# Patient Record
Sex: Female | Born: 1945
Health system: Southern US, Community
[De-identification: ages and names within clinical notes are randomized; demographics above are authoritative.]

## PROBLEM LIST (undated history)

## (undated) DIAGNOSIS — M1990 Unspecified osteoarthritis, unspecified site: Secondary | ICD-10-CM

## (undated) DIAGNOSIS — I10 Essential (primary) hypertension: Secondary | ICD-10-CM

## (undated) HISTORY — PX: TOE SURGERY: SHX1073

## (undated) HISTORY — PX: OTHER SURGICAL HISTORY: SHX169

## (undated) HISTORY — DX: Unspecified osteoarthritis, unspecified site: M19.90

## (undated) HISTORY — DX: Essential (primary) hypertension: I10

---

## 1999-01-11 ENCOUNTER — Other Ambulatory Visit: Admission: RE | Admit: 1999-01-11 | Discharge: 1999-01-11 | Payer: Self-pay | Admitting: Obstetrics and Gynecology

## 2000-01-13 ENCOUNTER — Other Ambulatory Visit: Admission: RE | Admit: 2000-01-13 | Discharge: 2000-01-13 | Payer: Self-pay | Admitting: Obstetrics and Gynecology

## 2013-12-13 LAB — HM PAP SMEAR

## 2013-12-13 LAB — HM MAMMOGRAPHY

## 2014-05-13 ENCOUNTER — Ambulatory Visit: Payer: Self-pay | Admitting: Internal Medicine

## 2014-06-15 ENCOUNTER — Ambulatory Visit (INDEPENDENT_AMBULATORY_CARE_PROVIDER_SITE_OTHER): Payer: BLUE CROSS/BLUE SHIELD | Admitting: Internal Medicine

## 2014-06-15 ENCOUNTER — Encounter: Payer: Self-pay | Admitting: Internal Medicine

## 2014-06-15 ENCOUNTER — Other Ambulatory Visit (INDEPENDENT_AMBULATORY_CARE_PROVIDER_SITE_OTHER): Payer: BLUE CROSS/BLUE SHIELD

## 2014-06-15 VITALS — BP 126/60 | HR 73 | Temp 98.2°F | Ht 64.5 in | Wt 166.0 lb

## 2014-06-15 DIAGNOSIS — H9191 Unspecified hearing loss, right ear: Secondary | ICD-10-CM

## 2014-06-15 DIAGNOSIS — I10 Essential (primary) hypertension: Secondary | ICD-10-CM

## 2014-06-15 DIAGNOSIS — Z0000 Encounter for general adult medical examination without abnormal findings: Secondary | ICD-10-CM

## 2014-06-15 DIAGNOSIS — E669 Obesity, unspecified: Secondary | ICD-10-CM

## 2014-06-15 DIAGNOSIS — M1990 Unspecified osteoarthritis, unspecified site: Secondary | ICD-10-CM

## 2014-06-15 LAB — LIPID PANEL
CHOLESTEROL: 220 mg/dL — AB (ref 0–200)
HDL: 60.1 mg/dL (ref >39.00)
LDL Cholesterol: 137 mg/dL — ABNORMAL HIGH (ref 0–99)
NonHDL: 159.9
Total CHOL/HDL Ratio: 4
Triglycerides: 116 mg/dL (ref 0.0–149.0)
VLDL: 23.2 mg/dL (ref 0.0–40.0)

## 2014-06-15 LAB — BASIC METABOLIC PANEL
BUN: 24 mg/dL — ABNORMAL HIGH (ref 6–23)
CALCIUM: 9.8 mg/dL (ref 8.4–10.5)
CO2: 34 meq/L — AB (ref 19–32)
Chloride: 103 mEq/L (ref 96–112)
Creatinine, Ser: 1 mg/dL (ref 0.40–1.20)
GFR: 58.54 mL/min — ABNORMAL LOW (ref >60.00)
Glucose, Bld: 103 mg/dL — ABNORMAL HIGH (ref 70–99)
Potassium: 3.4 mEq/L — ABNORMAL LOW (ref 3.5–5.1)
Sodium: 140 mEq/L (ref 135–145)

## 2014-06-15 NOTE — Progress Notes (Signed)
Pre visit review using our clinic review tool, if applicable. No additional management support is needed unless otherwise documented below in the visit note. 

## 2014-06-15 NOTE — Patient Instructions (Addendum)
We will check your labs today and get the records from [place]. We will call you back with the results.   We will send you to a nutritionist to talk about diet as well as the ENT to get the hearing checked.   We will see you back in about 6-12 months to see how you are doing and if you have any problems or questions before then please call the office.   Health Maintenance Adopting a healthy lifestyle and getting preventive care can go a long way to promote health and wellness. Talk with your health care provider about what schedule of regular examinations is right for you. This is a good chance for you to check in with your provider about disease prevention and staying healthy. In between checkups, there are plenty of things you can do on your own. Experts have done a lot of research about which lifestyle changes and preventive measures are most likely to keep you healthy. Ask your health care provider for more information. WEIGHT AND DIET  Eat a healthy diet  Be sure to include plenty of vegetables, fruits, low-fat dairy products, and lean protein.  Do not eat a lot of foods high in solid fats, added sugars, or salt.  Get regular exercise. This is one of the most important things you can do for your health.  Most adults should exercise for at least 150 minutes each week. The exercise should increase your heart rate and make you sweat (moderate-intensity exercise).  Most adults should also do strengthening exercises at least twice a week. This is in addition to the moderate-intensity exercise.  Maintain a healthy weight  Body mass index (BMI) is a measurement that can be used to identify possible weight problems. It estimates body fat based on height and weight. Your health care provider can help determine your BMI and help you achieve or maintain a healthy weight.  For females 78 years of age and older:   A BMI below 18.5 is considered underweight.  A BMI of 18.5 to 24.9 is  normal.  A BMI of 25 to 29.9 is considered overweight.  A BMI of 30 and above is considered obese.  Watch levels of cholesterol and blood lipids  You should start having your blood tested for lipids and cholesterol at 69 years of age, then have this test every 5 years.  You may need to have your cholesterol levels checked more often if:  Your lipid or cholesterol levels are high.  You are older than 68 years of age.  You are at high risk for heart disease.  CANCER SCREENING   Lung Cancer  Lung cancer screening is recommended for adults 56-77 years old who are at high risk for lung cancer because of a history of smoking.  A yearly low-dose CT scan of the lungs is recommended for people who:  Currently smoke.  Have quit within the past 15 years.  Have at least a 30-pack-year history of smoking. A pack year is smoking an average of one pack of cigarettes a day for 1 year.  Yearly screening should continue until it has been 15 years since you quit.  Yearly screening should stop if you develop a health problem that would prevent you from having lung cancer treatment.  Breast Cancer  Practice breast self-awareness. This means understanding how your breasts normally appear and feel.  It also means doing regular breast self-exams. Let your health care provider know about any changes, no matter how small.  If you are in your 20s or 30s, you should have a clinical breast exam (CBE) by a health care provider every 1-3 years as part of a regular health exam.  If you are 70 or older, have a CBE every year. Also consider having a breast X-ray (mammogram) every year.  If you have a family history of breast cancer, talk to your health care provider about genetic screening.  If you are at high risk for breast cancer, talk to your health care provider about having an MRI and a mammogram every year.  Breast cancer gene (BRCA) assessment is recommended for women who have family members  with BRCA-related cancers. BRCA-related cancers include:  Breast.  Ovarian.  Tubal.  Peritoneal cancers.  Results of the assessment will determine the need for genetic counseling and BRCA1 and BRCA2 testing. Cervical Cancer Routine pelvic examinations to screen for cervical cancer are no longer recommended for nonpregnant women who are considered low risk for cancer of the pelvic organs (ovaries, uterus, and vagina) and who do not have symptoms. A pelvic examination may be necessary if you have symptoms including those associated with pelvic infections. Ask your health care provider if a screening pelvic exam is right for you.   The Pap test is the screening test for cervical cancer for women who are considered at risk.  If you had a hysterectomy for a problem that was not cancer or a condition that could lead to cancer, then you no longer need Pap tests.  If you are older than 65 years, and you have had normal Pap tests for the past 10 years, you no longer need to have Pap tests.  If you have had past treatment for cervical cancer or a condition that could lead to cancer, you need Pap tests and screening for cancer for at least 20 years after your treatment.  If you no longer get a Pap test, assess your risk factors if they change (such as having a new sexual partner). This can affect whether you should start being screened again.  Some women have medical problems that increase their chance of getting cervical cancer. If this is the case for you, your health care provider may recommend more frequent screening and Pap tests.  The human papillomavirus (HPV) test is another test that may be used for cervical cancer screening. The HPV test looks for the virus that can cause cell changes in the cervix. The cells collected during the Pap test can be tested for HPV.  The HPV test can be used to screen women 34 years of age and older. Getting tested for HPV can extend the interval between normal  Pap tests from three to five years.  An HPV test also should be used to screen women of any age who have unclear Pap test results.  After 69 years of age, women should have HPV testing as often as Pap tests.  Colorectal Cancer  This type of cancer can be detected and often prevented.  Routine colorectal cancer screening usually begins at 69 years of age and continues through 69 years of age.  Your health care provider may recommend screening at an earlier age if you have risk factors for colon cancer.  Your health care provider may also recommend using home test kits to check for hidden blood in the stool.  A small camera at the end of a tube can be used to examine your colon directly (sigmoidoscopy or colonoscopy). This is done to check for  the earliest forms of colorectal cancer.  Routine screening usually begins at age 9.  Direct examination of the colon should be repeated every 5-10 years through 69 years of age. However, you may need to be screened more often if early forms of precancerous polyps or small growths are found. Skin Cancer  Check your skin from head to toe regularly.  Tell your health care provider about any new moles or changes in moles, especially if there is a change in a mole's shape or color.  Also tell your health care provider if you have a mole that is larger than the size of a pencil eraser.  Always use sunscreen. Apply sunscreen liberally and repeatedly throughout the day.  Protect yourself by wearing long sleeves, pants, a wide-brimmed hat, and sunglasses whenever you are outside. HEART DISEASE, DIABETES, AND HIGH BLOOD PRESSURE   Have your blood pressure checked at least every 1-2 years. High blood pressure causes heart disease and increases the risk of stroke.  If you are between 19 years and 30 years old, ask your health care provider if you should take aspirin to prevent strokes.  Have regular diabetes screenings. This involves taking a blood  sample to check your fasting blood sugar level.  If you are at a normal weight and have a low risk for diabetes, have this test once every three years after 69 years of age.  If you are overweight and have a high risk for diabetes, consider being tested at a younger age or more often. PREVENTING INFECTION  Hepatitis B  If you have a higher risk for hepatitis B, you should be screened for this virus. You are considered at high risk for hepatitis B if:  You were born in a country where hepatitis B is common. Ask your health care provider which countries are considered high risk.  Your parents were born in a high-risk country, and you have not been immunized against hepatitis B (hepatitis B vaccine).  You have HIV or AIDS.  You use needles to inject street drugs.  You live with someone who has hepatitis B.  You have had sex with someone who has hepatitis B.  You get hemodialysis treatment.  You take certain medicines for conditions, including cancer, organ transplantation, and autoimmune conditions. Hepatitis C  Blood testing is recommended for:  Everyone born from [birth year] through 1965.  Anyone with known risk factors for hepatitis C. Sexually transmitted infections (STIs)  You should be screened for sexually transmitted infections (STIs) including gonorrhea and chlamydia if:  You are sexually active and are younger than 69 years of age.  You are older than 69 years of age and your health care provider tells you that you are at risk for this type of infection.  Your sexual activity has changed since you were last screened and you are at an increased risk for chlamydia or gonorrhea. Ask your health care provider if you are at risk.  If you do not have HIV, but are at risk, it may be recommended that you take a prescription medicine daily to prevent HIV infection. This is called pre-exposure prophylaxis (PrEP). You are considered at risk if:  You are sexually active and do not  regularly use condoms or know the HIV status of your partner(s).  You take drugs by injection.  You are sexually active with a partner who has HIV. Talk with your health care provider about whether you are at high risk of being infected with HIV. If you  choose to begin PrEP, you should first be tested for HIV. You should then be tested every 3 months for as long as you are taking PrEP.  PREGNANCY   If you are premenopausal and you may become pregnant, ask your health care provider about preconception counseling.  If you may become pregnant, take 400 to 800 micrograms (mcg) of folic acid every day.  If you want to prevent pregnancy, talk to your health care provider about birth control (contraception). OSTEOPOROSIS AND MENOPAUSE   Osteoporosis is a disease in which the bones lose minerals and strength with aging. This can result in serious bone fractures. Your risk for osteoporosis can be identified using a bone density scan.  If you are 101 years of age or older, or if you are at risk for osteoporosis and fractures, ask your health care provider if you should be screened.  Ask your health care provider whether you should take a calcium or vitamin D supplement to lower your risk for osteoporosis.  Menopause may have certain physical symptoms and risks.  Hormone replacement therapy may reduce some of these symptoms and risks. Talk to your health care provider about whether hormone replacement therapy is right for you.  HOME CARE INSTRUCTIONS   Schedule regular health, dental, and eye exams.  Stay current with your immunizations.   Do not use any tobacco products including cigarettes, chewing tobacco, or electronic cigarettes.  If you are pregnant, do not drink alcohol.  If you are breastfeeding, limit how much and how often you drink alcohol.  Limit alcohol intake to no more than 1 drink per day for nonpregnant women. One drink equals 12 ounces of beer, 5 ounces of wine, or 1  ounces of hard liquor.  Do not use street drugs.  Do not share needles.  Ask your health care provider for help if you need support or information about quitting drugs.  Tell your health care provider if you often feel depressed.  Tell your health care provider if you have ever been abused or do not feel safe at home. Document Released: 10/17/2010 Document Revised: 08/18/2013 Document Reviewed: 03/05/2013 [facility] Patient Information 2015 [place], [place]. This information is not intended to replace advice given to you by your health care provider. Make sure you discuss any questions you have with your health care provider.

## 2014-06-18 ENCOUNTER — Encounter: Payer: Self-pay | Admitting: Internal Medicine

## 2014-06-18 ENCOUNTER — Telehealth: Payer: Self-pay | Admitting: Internal Medicine

## 2014-06-18 DIAGNOSIS — Z0000 Encounter for general adult medical examination without abnormal findings: Secondary | ICD-10-CM | POA: Insufficient documentation

## 2014-06-18 DIAGNOSIS — I10 Essential (primary) hypertension: Secondary | ICD-10-CM | POA: Insufficient documentation

## 2014-06-18 DIAGNOSIS — M1990 Unspecified osteoarthritis, unspecified site: Secondary | ICD-10-CM | POA: Insufficient documentation

## 2014-06-18 DIAGNOSIS — H9191 Unspecified hearing loss, right ear: Secondary | ICD-10-CM | POA: Insufficient documentation

## 2014-06-18 NOTE — Progress Notes (Signed)
   Subjective:    Patient ID: Dana Small, female    DOB: 10-21-1945, 69 y.o.   MRN: [id number]  HPI The patient is a 69 YO female who is coming in today for wellness. She denies any new illness or medications from last visit.   Diet: heart healthy Physical activity: sedentary Depression/mood screen: negative Hearing: decreased on right, hearing test ordered Visual acuity: grossly normal, performs annual eye exam  ADLs: capable Fall risk: none Home safety: good Cognitive evaluation: intact to orientation, naming, recall and repetition EOL planning: adv directives, full code/ I agree  I have personally reviewed and have noted 1. The patient's medical and social history - no change 2. Their use of alcohol, tobacco or illicit drugs - no change 3. Their current medications and supplements 4. The patient's functional ability including ADL's, fall risks, home safety risks and hearing or visual impairment. 5. Diet and physical activities 6. Evidence for depression or mood disorders 7. Care team reviewed and updated (available under snapshot)  Review of Systems  Constitutional: Negative for fever, activity change, appetite change, fatigue and unexpected weight change.  HENT: Positive for hearing loss. Negative for congestion, ear discharge and ear pain.   Eyes: Negative.   Respiratory: Negative for cough, chest tightness, shortness of breath and wheezing.   Cardiovascular: Negative for chest pain, palpitations and leg swelling.  Gastrointestinal: Negative for nausea, abdominal pain, diarrhea, constipation, blood in stool and abdominal distention.  Musculoskeletal: Positive for arthralgias. Negative for myalgias, back pain and gait problem.  Skin: Negative.   Neurological: Negative.   Psychiatric/Behavioral: Negative.       Objective:   Physical Exam  Constitutional: She is oriented to person, place, and time. She appears well-developed and well-nourished.  HENT:  Head:  Normocephalic and atraumatic.  Eyes: EOM are normal.  Neck: Normal range of motion.  Cardiovascular: Normal rate and regular rhythm.   Pulmonary/Chest: Effort normal and breath sounds normal. No respiratory distress. She has no wheezes.  Abdominal: Soft. Bowel sounds are normal. She exhibits no distension. There is no tenderness.  Musculoskeletal: She exhibits no edema.  Neurological: She is alert and oriented to person, place, and time. Coordination normal.  Skin: Skin is warm and dry.  Psychiatric: She has a normal mood and affect.   Filed Vitals:   06/15/14 1307  BP: 126/60  Pulse: 73  Temp: 98.2 F (36.8 C)  TempSrc: Oral  Height: 5' 4.5" (1.638 m)  Weight: 166 lb (75.297 kg)  SpO2: 98%      Assessment & Plan:

## 2014-06-18 NOTE — Assessment & Plan Note (Signed)
Check BMP, BP controlled on maxzide at today's visit.

## 2014-06-18 NOTE — Assessment & Plan Note (Signed)
Ordered hearing test for screening, not missing much in conversation but having difficulty in larger group settings.

## 2014-06-18 NOTE — Assessment & Plan Note (Signed)
She thinks that she is up to date on shots. Will obtain records from previous provider to verify. No shots given today. She knows she has colonoscopy but may be due for repeat. Also thinks she has had dexa, not sure what year or results. List of screening recommendations given to the patient. Hearing test ordered per deficit noted on exam.

## 2014-06-18 NOTE — Telephone Encounter (Signed)
Rec'd from [facility] forward 51 pages to Dr. Doug Sou

## 2014-06-22 ENCOUNTER — Other Ambulatory Visit: Payer: Self-pay | Admitting: Geriatric Medicine

## 2014-06-22 ENCOUNTER — Telehealth: Payer: Self-pay | Admitting: Internal Medicine

## 2014-06-22 MED ORDER — TRIAMTERENE-HCTZ 37.5-25 MG PO CAPS: 1.0000 | ORAL_CAPSULE | Freq: Every day | ORAL | Status: DC

## 2014-06-22 NOTE — Telephone Encounter (Signed)
Pt called in requesting refill on her triamterene-hydrochlorothiazide (DYAZIDE) 37.5-25 MG Wants year script    [facility] on [address]

## 2014-06-22 NOTE — Telephone Encounter (Signed)
Rec'd from Dr. Langston Reusing MD forward 68 pages to [facility]

## 2014-06-23 ENCOUNTER — Other Ambulatory Visit: Payer: Self-pay | Admitting: Geriatric Medicine

## 2014-06-23 MED ORDER — TRIAMTERENE-HCTZ 37.5-25 MG PO CAPS: 1.0000 | ORAL_CAPSULE | Freq: Every day | ORAL | Status: DC

## 2014-06-23 MED ORDER — TRIAMTERENE-HCTZ 37.5-25 MG PO TABS: 1.0000 | ORAL_TABLET | Freq: Every day | ORAL | Status: DC

## 2014-06-23 NOTE — Telephone Encounter (Signed)
Pt called said that this need to be send to [facility] on [address].

## 2014-06-23 NOTE — Telephone Encounter (Signed)
Sent to pharmacy 

## 2014-07-03 ENCOUNTER — Encounter: Payer: BLUE CROSS/BLUE SHIELD | Attending: Internal Medicine | Admitting: Skilled Nursing Facility1

## 2014-07-03 ENCOUNTER — Encounter: Payer: Self-pay | Admitting: Skilled Nursing Facility1

## 2014-07-03 VITALS — Ht 64.0 in | Wt 164.0 lb

## 2014-07-03 DIAGNOSIS — Z713 Dietary counseling and surveillance: Secondary | ICD-10-CM | POA: Diagnosis not present

## 2014-07-03 DIAGNOSIS — E663 Overweight: Secondary | ICD-10-CM | POA: Insufficient documentation

## 2014-07-03 DIAGNOSIS — Z6828 Body mass index (BMI) 28.0-28.9, adult: Secondary | ICD-10-CM | POA: Diagnosis not present

## 2014-07-03 NOTE — Patient Instructions (Signed)
-  Gardening on the days you do not go to the gym -Try to find a stress relief mechanism for your nights; perhaps a stationary bike or kick boxing -Ensure your supplements have the USP label

## 2014-07-03 NOTE — Progress Notes (Signed)
  Medical Nutrition Therapy:  Appt start time: 1130 end time:  1230.   Assessment:  Primary concerns today: Referred for overweight. Pt states she Would like to eat healthier and maybe lose a couple of pounds (15 pounds). Pt states she Has tried Weight watchers and lost a couple of pounds but did not want to stick with it. Pt states she is Stressed at night and eating to feel better. She is very afraid of her house being broken into. She is currently living alone but her husband will be back home in April.  Pt Retired at 69 years old and was 152 pounds at that time.   Pt is currently taking a multivitamin and a vitamin C. Pt states she has a deviated septum and says she feels she is not getting enough oxygen from that.  Preferred Learning Style:   No preference indicated   Learning Readiness:   Ready  MEDICATIONS: See List   DIETARY INTAKE:  Usual eating pattern includes 3 meals and 2-3 snacks per day.  Everyday foods include none stated. Avoided foods include carbohydrates and there foods "they" say are "bad".    24-hr recall:  B ( AM): oatmeal, weight watchers bar  Snk ( AM): nuts  L ( PM): eats out Snk ( PM): pumpkin seeds D ( PM): whole wheat and artichoke pasta with cheese powder Snk ( PM): cheese its, chips, jelly beans Beverages: wine, coffee  Usual physical activity: 2 days a week water aerobics (1 hour) and walks 2 times a week 1.5 miles  Estimated energy needs: 1600 calories 180 g carbohydrates 120 g protein 44 g fat  Progress Towards Goal(s):  In progress.   Nutritional Diagnosis:  NB-1.1 Food and nutrition-related knowledge deficit As related to no previous nutrition education.  As evidenced by BMI 28, avoiding carbohydrates and other foods becasue they were perceived as "bad".    Intervention:  Nutrition Counseling for overweight. Dietitian educated the patient on the importance of 3 meals with 2-3 snacks daily, daily physical activity, and the importance of  all food groups including carbohydrates.  Goals:   -Gardening on the days you do not go to the gym -Try to find a stress relief mechanism for your nights; perhaps a stationary bike or kick boxing -Ensure your supplements have the USP label  Teaching Method Utilized:  Visual Auditory  Handouts given during visit include:  MyPlate  15 gram carbohydrate snack sheet  Barriers to learning/adherence to lifestyle change: her fear at night  Demonstrated degree of understanding via:  Teach Back   Monitoring/Evaluation:  Dietary intake, exercise, and body weight prn.

## 2014-07-06 ENCOUNTER — Other Ambulatory Visit: Payer: Self-pay | Admitting: Geriatric Medicine

## 2014-07-06 MED ORDER — TRIAMTERENE-HCTZ 37.5-25 MG PO TABS: 1.0000 | ORAL_TABLET | Freq: Every day | ORAL | Status: DC

## 2014-07-13 ENCOUNTER — Other Ambulatory Visit: Payer: Self-pay | Admitting: Geriatric Medicine

## 2014-07-13 MED ORDER — TRIAMTERENE-HCTZ 37.5-25 MG PO TABS: 1.0000 | ORAL_TABLET | Freq: Every day | ORAL | Status: DC

## 2015-07-01 DIAGNOSIS — L720 Epidermal cyst: Secondary | ICD-10-CM | POA: Diagnosis not present

## 2015-07-01 DIAGNOSIS — L219 Seborrheic dermatitis, unspecified: Secondary | ICD-10-CM | POA: Diagnosis not present

## 2015-07-01 DIAGNOSIS — L570 Actinic keratosis: Secondary | ICD-10-CM | POA: Diagnosis not present

## 2015-07-01 DIAGNOSIS — D224 Melanocytic nevi of scalp and neck: Secondary | ICD-10-CM | POA: Diagnosis not present

## 2015-07-01 DIAGNOSIS — D485 Neoplasm of uncertain behavior of skin: Secondary | ICD-10-CM | POA: Diagnosis not present

## 2015-07-01 DIAGNOSIS — L821 Other seborrheic keratosis: Secondary | ICD-10-CM | POA: Diagnosis not present

## 2015-07-01 DIAGNOSIS — D2222 Melanocytic nevi of left ear and external auricular canal: Secondary | ICD-10-CM | POA: Diagnosis not present

## 2015-08-16 DIAGNOSIS — H16132 Photokeratitis, left eye: Secondary | ICD-10-CM | POA: Diagnosis not present

## 2015-08-16 DIAGNOSIS — D485 Neoplasm of uncertain behavior of skin: Secondary | ICD-10-CM | POA: Diagnosis not present

## 2015-08-17 DIAGNOSIS — L820 Inflamed seborrheic keratosis: Secondary | ICD-10-CM | POA: Diagnosis not present

## 2015-08-27 ENCOUNTER — Other Ambulatory Visit: Payer: Self-pay

## 2015-08-27 MED ORDER — TRIAMTERENE-HCTZ 37.5-25 MG PO TABS: 1.0000 | ORAL_TABLET | Freq: Every day | ORAL | Status: DC

## 2015-10-11 ENCOUNTER — Other Ambulatory Visit (INDEPENDENT_AMBULATORY_CARE_PROVIDER_SITE_OTHER): Payer: Medicare Other

## 2015-10-11 ENCOUNTER — Ambulatory Visit (INDEPENDENT_AMBULATORY_CARE_PROVIDER_SITE_OTHER): Payer: Medicare Other | Admitting: Internal Medicine

## 2015-10-11 ENCOUNTER — Encounter: Payer: Self-pay | Admitting: Internal Medicine

## 2015-10-11 VITALS — BP 160/82 | HR 67 | Temp 98.6°F | Resp 14 | Ht 64.5 in | Wt 170.0 lb

## 2015-10-11 DIAGNOSIS — E789 Disorder of lipoprotein metabolism, unspecified: Secondary | ICD-10-CM

## 2015-10-11 DIAGNOSIS — I10 Essential (primary) hypertension: Secondary | ICD-10-CM | POA: Diagnosis not present

## 2015-10-11 DIAGNOSIS — Z1231 Encounter for screening mammogram for malignant neoplasm of breast: Secondary | ICD-10-CM

## 2015-10-11 DIAGNOSIS — Z23 Encounter for immunization: Secondary | ICD-10-CM | POA: Diagnosis not present

## 2015-10-11 DIAGNOSIS — Z0000 Encounter for general adult medical examination without abnormal findings: Secondary | ICD-10-CM

## 2015-10-11 DIAGNOSIS — R5383 Other fatigue: Secondary | ICD-10-CM

## 2015-10-11 LAB — COMPREHENSIVE METABOLIC PANEL
ALK PHOS: 65 U/L (ref 39–117)
ALT: 21 U/L (ref 0–35)
AST: 20 U/L (ref 0–37)
Albumin: 4.4 g/dL (ref 3.5–5.2)
BUN: 22 mg/dL (ref 6–23)
CHLORIDE: 101 meq/L (ref 96–112)
CO2: 34 meq/L — AB (ref 19–32)
Calcium: 10.1 mg/dL (ref 8.4–10.5)
Creatinine, Ser: 0.68 mg/dL (ref 0.40–1.20)
GFR: 91 mL/min (ref >60.00)
GLUCOSE: 105 mg/dL — AB (ref 70–99)
POTASSIUM: 4.4 meq/L (ref 3.5–5.1)
Sodium: 140 mEq/L (ref 135–145)
Total Bilirubin: 0.5 mg/dL (ref 0.2–1.2)
Total Protein: 7.3 g/dL (ref 6.0–8.3)

## 2015-10-11 LAB — LIPID PANEL
CHOL/HDL RATIO: 4
Cholesterol: 214 mg/dL — ABNORMAL HIGH (ref 0–200)
HDL: 55.2 mg/dL (ref >39.00)
LDL Cholesterol: 133 mg/dL — ABNORMAL HIGH (ref 0–99)
NonHDL: 158.3
TRIGLYCERIDES: 125 mg/dL (ref 0.0–149.0)
VLDL: 25 mg/dL (ref 0.0–40.0)

## 2015-10-11 LAB — TSH: TSH: 2.34 u[IU]/mL (ref 0.35–4.50)

## 2015-10-11 NOTE — Progress Notes (Signed)
   Subjective:    Patient ID: Dana Small, female    DOB: [birth year], 70 y.o.   MRN: [id number]  HPI Here for medicare wellness, no new complaints. Please see A/P for status and treatment of chronic medical problems.   Diet: heart healthy Physical activity: active Depression/mood screen: negative Hearing: intact to whispered voice Visual acuity: grossly normal, performs annual eye exam  ADLs: capable Fall risk: none Home safety: good Cognitive evaluation: intact to orientation, naming, recall and repetition EOL planning: adv directives discussed  I have personally reviewed and have noted 1. The patient's medical and social history - reviewed today no changes 2. Their use of alcohol, tobacco or illicit drugs 3. Their current medications and supplements 4. The patient's functional ability including ADL's, fall risks, home safety risks and hearing or visual impairment. 5. Diet and physical activities 6. Evidence for depression or mood disorders 7. Care team reviewed and updated (available in snapshot)  Review of Systems  Constitutional: Negative for fever, activity change, appetite change, fatigue and unexpected weight change.  HENT: Positive for hearing loss. Negative for congestion, ear discharge and ear pain.   Eyes: Negative.   Respiratory: Negative for cough, chest tightness, shortness of breath and wheezing.   Cardiovascular: Negative for chest pain, palpitations and leg swelling.  Gastrointestinal: Negative for nausea, abdominal pain, diarrhea, constipation, blood in stool and abdominal distention.  Musculoskeletal: Positive for arthralgias. Negative for myalgias, back pain and gait problem.  Skin: Negative.   Neurological: Negative.   Psychiatric/Behavioral: Negative.       Objective:   Physical Exam  Constitutional: She is oriented to person, place, and time. She appears well-developed and well-nourished.  HENT:  Head: Normocephalic and atraumatic.  Eyes: EOM are  normal.  Neck: Normal range of motion.  Cardiovascular: Normal rate and regular rhythm.   Pulmonary/Chest: Effort normal and breath sounds normal. No respiratory distress. She has no wheezes.  Abdominal: Soft. Bowel sounds are normal. She exhibits no distension. There is no tenderness.  Musculoskeletal: She exhibits no edema.  Neurological: She is alert and oriented to person, place, and time. Coordination normal.  Skin: Skin is warm and dry.  Psychiatric: She has a normal mood and affect.   Filed Vitals:   10/11/15 1514  BP: 160/82  Pulse: 67  Temp: 98.6 F (37 C)  TempSrc: Oral  Resp: 14  Height: 5' 4.5" (1.638 m)  Weight: 170 lb (77.111 kg)  SpO2: 98%      Assessment & Plan:  Pneumonia 23 given at visit.

## 2015-10-11 NOTE — Progress Notes (Signed)
Pre visit review using our clinic review tool, if applicable. No additional management support is needed unless otherwise documented below in the visit note. 

## 2015-10-11 NOTE — Patient Instructions (Addendum)
We have ordered the mammogram to get that checked.   We are checking the blood work today and have given you the pneumonia shot today. You are due another next year and then you will be done with those.   Work on losing about 5-10 pounds to help with the blood pressure.   Health Maintenance, Female Adopting a healthy lifestyle and getting preventive care can go a long way to promote health and wellness. Talk with your health care provider about what schedule of regular examinations is right for you. This is a good chance for you to check in with your provider about disease prevention and staying healthy. In between checkups, there are plenty of things you can do on your own. Experts have done a lot of research about which lifestyle changes and preventive measures are most likely to keep you healthy. Ask your health care provider for more information. WEIGHT AND DIET  Eat a healthy diet  Be sure to include plenty of vegetables, fruits, low-fat dairy products, and lean protein.  Do not eat a lot of foods high in solid fats, added sugars, or salt.  Get regular exercise. This is one of the most important things you can do for your health.  Most adults should exercise for at least 150 minutes each week. The exercise should increase your heart rate and make you sweat (moderate-intensity exercise).  Most adults should also do strengthening exercises at least twice a week. This is in addition to the moderate-intensity exercise.  Maintain a healthy weight  Body mass index (BMI) is a measurement that can be used to identify possible weight problems. It estimates body fat based on height and weight. Your health care provider can help determine your BMI and help you achieve or maintain a healthy weight.  For females 18 years of age and older:   A BMI below 18.5 is considered underweight.  A BMI of 18.5 to 24.9 is normal.  A BMI of 25 to 29.9 is considered overweight.  A BMI of 30 and above is  considered obese.  Watch levels of cholesterol and blood lipids  You should start having your blood tested for lipids and cholesterol at 70 years of age, then have this test every 5 years.  You may need to have your cholesterol levels checked more often if:  Your lipid or cholesterol levels are high.  You are older than 70 years of age.  You are at high risk for heart disease.  CANCER SCREENING   Lung Cancer  Lung cancer screening is recommended for adults 23-49 years old who are at high risk for lung cancer because of a history of smoking.  A yearly low-dose CT scan of the lungs is recommended for people who:  Currently smoke.  Have quit within the past 15 years.  Have at least a 30-pack-year history of smoking. A pack year is smoking an average of one pack of cigarettes a day for 1 year.  Yearly screening should continue until it has been 15 years since you quit.  Yearly screening should stop if you develop a health problem that would prevent you from having lung cancer treatment.  Breast Cancer  Practice breast self-awareness. This means understanding how your breasts normally appear and feel.  It also means doing regular breast self-exams. Let your health care provider know about any changes, no matter how small.  If you are in your 20s or 30s, you should have a clinical breast exam (CBE) by a  health care provider every 1-3 years as part of a regular health exam.  If you are 40 or older, have a CBE every year. Also consider having a breast X-ray (mammogram) every year.  If you have a family history of breast cancer, talk to your health care provider about genetic screening.  If you are at high risk for breast cancer, talk to your health care provider about having an MRI and a mammogram every year.  Breast cancer gene (BRCA) assessment is recommended for women who have family members with BRCA-related cancers. BRCA-related cancers  include:  Breast.  Ovarian.  Tubal.  Peritoneal cancers.  Results of the assessment will determine the need for genetic counseling and BRCA1 and BRCA2 testing. Cervical Cancer Your health care provider may recommend that you be screened regularly for cancer of the pelvic organs (ovaries, uterus, and vagina). This screening involves a pelvic examination, including checking for microscopic changes to the surface of your cervix (Pap test). You may be encouraged to have this screening done every 3 years, beginning at age 21.  For women ages 30-65, health care providers may recommend pelvic exams and Pap testing every 3 years, or they may recommend the Pap and pelvic exam, combined with testing for human papilloma virus (HPV), every 5 years. Some types of HPV increase your risk of cervical cancer. Testing for HPV may also be done on women of any age with unclear Pap test results.  Other health care providers may not recommend any screening for nonpregnant women who are considered low risk for pelvic cancer and who do not have symptoms. Ask your health care provider if a screening pelvic exam is right for you.  If you have had past treatment for cervical cancer or a condition that could lead to cancer, you need Pap tests and screening for cancer for at least 20 years after your treatment. If Pap tests have been discontinued, your risk factors (such as having a new sexual partner) need to be reassessed to determine if screening should resume. Some women have medical problems that increase the chance of getting cervical cancer. In these cases, your health care provider may recommend more frequent screening and Pap tests. Colorectal Cancer  This type of cancer can be detected and often prevented.  Routine colorectal cancer screening usually begins at 70 years of age and continues through 70 years of age.  Your health care provider may recommend screening at an earlier age if you have risk factors for  colon cancer.  Your health care provider may also recommend using home test kits to check for hidden blood in the stool.  A small camera at the end of a tube can be used to examine your colon directly (sigmoidoscopy or colonoscopy). This is done to check for the earliest forms of colorectal cancer.  Routine screening usually begins at age 50.  Direct examination of the colon should be repeated every 5-10 years through 70 years of age. However, you may need to be screened more often if early forms of precancerous polyps or small growths are found. Skin Cancer  Check your skin from head to toe regularly.  Tell your health care provider about any new moles or changes in moles, especially if there is a change in a mole's shape or color.  Also tell your health care provider if you have a mole that is larger than the size of a pencil eraser.  Always use sunscreen. Apply sunscreen liberally and repeatedly throughout the day.    Protect yourself by wearing long sleeves, pants, a wide-brimmed hat, and sunglasses whenever you are outside. HEART DISEASE, DIABETES, AND HIGH BLOOD PRESSURE   High blood pressure causes heart disease and increases the risk of stroke. High blood pressure is more likely to develop in:  People who have blood pressure in the high end of the normal range (130-139/85-89 mm Hg).  People who are overweight or obese.  People who are African American.  If you are 18-39 years of age, have your blood pressure checked every 3-5 years. If you are 40 years of age or older, have your blood pressure checked every year. You should have your blood pressure measured twice--once when you are at a hospital or clinic, and once when you are not at a hospital or clinic. Record the average of the two measurements. To check your blood pressure when you are not at a hospital or clinic, you can use:  An automated blood pressure machine at a pharmacy.  A home blood pressure monitor.  If you  are between 55 years and 79 years old, ask your health care provider if you should take aspirin to prevent strokes.  Have regular diabetes screenings. This involves taking a blood sample to check your fasting blood sugar level.  If you are at a normal weight and have a low risk for diabetes, have this test once every three years after 70 years of age.  If you are overweight and have a high risk for diabetes, consider being tested at a younger age or more often. PREVENTING INFECTION  Hepatitis B  If you have a higher risk for hepatitis B, you should be screened for this virus. You are considered at high risk for hepatitis B if:  You were born in a country where hepatitis B is common. Ask your health care provider which countries are considered high risk.  Your parents were born in a high-risk country, and you have not been immunized against hepatitis B (hepatitis B vaccine).  You have HIV or AIDS.  You use needles to inject street drugs.  You live with someone who has hepatitis B.  You have had sex with someone who has hepatitis B.  You get hemodialysis treatment.  You take certain medicines for conditions, including cancer, organ transplantation, and autoimmune conditions. Hepatitis C  Blood testing is recommended for:  Everyone born from 1945 through 1965.  Anyone with known risk factors for hepatitis C. Sexually transmitted infections (STIs)  You should be screened for sexually transmitted infections (STIs) including gonorrhea and chlamydia if:  You are sexually active and are younger than 70 years of age.  You are older than 70 years of age and your health care provider tells you that you are at risk for this type of infection.  Your sexual activity has changed since you were last screened and you are at an increased risk for chlamydia or gonorrhea. Ask your health care provider if you are at risk.  If you do not have HIV, but are at risk, it may be recommended that you  take a prescription medicine daily to prevent HIV infection. This is called pre-exposure prophylaxis (PrEP). You are considered at risk if:  You are sexually active and do not regularly use condoms or know the HIV status of your partner(s).  You take drugs by injection.  You are sexually active with a partner who has HIV. Talk with your health care provider about whether you are at high risk of being infected   with HIV. If you choose to begin PrEP, you should first be tested for HIV. You should then be tested every 3 months for as long as you are taking PrEP.  PREGNANCY   If you are premenopausal and you may become pregnant, ask your health care provider about preconception counseling.  If you may become pregnant, take 400 to 800 micrograms (mcg) of folic acid every day.  If you want to prevent pregnancy, talk to your health care provider about birth control (contraception). OSTEOPOROSIS AND MENOPAUSE   Osteoporosis is a disease in which the bones lose minerals and strength with aging. This can result in serious bone fractures. Your risk for osteoporosis can be identified using a bone density scan.  If you are 28 years of age or older, or if you are at risk for osteoporosis and fractures, ask your health care provider if you should be screened.  Ask your health care provider whether you should take a calcium or vitamin D supplement to lower your risk for osteoporosis.  Menopause may have certain physical symptoms and risks.  Hormone replacement therapy may reduce some of these symptoms and risks. Talk to your health care provider about whether hormone replacement therapy is right for you.  HOME CARE INSTRUCTIONS   Schedule regular health, dental, and eye exams.  Stay current with your immunizations.   Do not use any tobacco products including cigarettes, chewing tobacco, or electronic cigarettes.  If you are pregnant, do not drink alcohol.  If you are breastfeeding, limit how  much and how often you drink alcohol.  Limit alcohol intake to no more than 1 drink per day for nonpregnant women. One drink equals 12 ounces of beer, 5 ounces of wine, or 1 ounces of hard liquor.  Do not use street drugs.  Do not share needles.  Ask your health care provider for help if you need support or information about quitting drugs.  Tell your health care provider if you often feel depressed.  Tell your health care provider if you have ever been abused or do not feel safe at home.   This information is not intended to replace advice given to you by your health care provider. Make sure you discuss any questions you have with your health care provider.   Document Released: 10/17/2010 Document Revised: 04/24/2014 Document Reviewed: 03/05/2013 Elsevier Interactive Patient Education [employer].

## 2015-10-12 NOTE — Assessment & Plan Note (Signed)
Mammogram ordered, labs ordered. Given pneumonia 23 shot to start pneumonia series. Immunizations are not up to date. Colonoscopy due in 2025. Counseled on sun safety and mole surveillance. Given 10 year screening recommendations.

## 2015-10-12 NOTE — Assessment & Plan Note (Signed)
BP initially high and rechecked and improved but not recorded in the medical record. We elected to hold on medication change today and work on lifestyle changes with less sodium intake and increasing exercise.

## 2015-10-20 ENCOUNTER — Other Ambulatory Visit: Payer: Self-pay | Admitting: Internal Medicine

## 2015-10-20 DIAGNOSIS — Z1231 Encounter for screening mammogram for malignant neoplasm of breast: Secondary | ICD-10-CM

## 2015-11-02 DIAGNOSIS — L821 Other seborrheic keratosis: Secondary | ICD-10-CM | POA: Diagnosis not present

## 2015-11-02 DIAGNOSIS — D485 Neoplasm of uncertain behavior of skin: Secondary | ICD-10-CM | POA: Diagnosis not present

## 2015-11-04 ENCOUNTER — Ambulatory Visit
Admission: RE | Admit: 2015-11-04 | Discharge: 2015-11-04 | Disposition: A | Discharge status: Home / Self Care | Payer: Medicare Other | Source: Ambulatory Visit | Attending: Internal Medicine | Admitting: Internal Medicine

## 2015-11-04 DIAGNOSIS — Z1231 Encounter for screening mammogram for malignant neoplasm of breast: Secondary | ICD-10-CM

## 2015-11-18 ENCOUNTER — Other Ambulatory Visit: Payer: Self-pay | Admitting: Internal Medicine

## 2015-11-30 DIAGNOSIS — M9903 Segmental and somatic dysfunction of lumbar region: Secondary | ICD-10-CM | POA: Diagnosis not present

## 2015-11-30 DIAGNOSIS — M9904 Segmental and somatic dysfunction of sacral region: Secondary | ICD-10-CM | POA: Diagnosis not present

## 2015-11-30 DIAGNOSIS — M545 Low back pain: Secondary | ICD-10-CM | POA: Diagnosis not present

## 2015-11-30 DIAGNOSIS — M25551 Pain in right hip: Secondary | ICD-10-CM | POA: Diagnosis not present

## 2015-11-30 DIAGNOSIS — M62830 Muscle spasm of back: Secondary | ICD-10-CM | POA: Diagnosis not present

## 2015-12-02 DIAGNOSIS — M545 Low back pain: Secondary | ICD-10-CM | POA: Diagnosis not present

## 2015-12-02 DIAGNOSIS — M62830 Muscle spasm of back: Secondary | ICD-10-CM | POA: Diagnosis not present

## 2015-12-02 DIAGNOSIS — M25551 Pain in right hip: Secondary | ICD-10-CM | POA: Diagnosis not present

## 2015-12-02 DIAGNOSIS — M9904 Segmental and somatic dysfunction of sacral region: Secondary | ICD-10-CM | POA: Diagnosis not present

## 2015-12-02 DIAGNOSIS — M9903 Segmental and somatic dysfunction of lumbar region: Secondary | ICD-10-CM | POA: Diagnosis not present

## 2015-12-06 DIAGNOSIS — M9904 Segmental and somatic dysfunction of sacral region: Secondary | ICD-10-CM | POA: Diagnosis not present

## 2015-12-06 DIAGNOSIS — M9903 Segmental and somatic dysfunction of lumbar region: Secondary | ICD-10-CM | POA: Diagnosis not present

## 2015-12-06 DIAGNOSIS — M25551 Pain in right hip: Secondary | ICD-10-CM | POA: Diagnosis not present

## 2015-12-06 DIAGNOSIS — M62830 Muscle spasm of back: Secondary | ICD-10-CM | POA: Diagnosis not present

## 2015-12-06 DIAGNOSIS — M545 Low back pain: Secondary | ICD-10-CM | POA: Diagnosis not present

## 2015-12-09 DIAGNOSIS — M9904 Segmental and somatic dysfunction of sacral region: Secondary | ICD-10-CM | POA: Diagnosis not present

## 2015-12-09 DIAGNOSIS — M545 Low back pain: Secondary | ICD-10-CM | POA: Diagnosis not present

## 2015-12-09 DIAGNOSIS — M25551 Pain in right hip: Secondary | ICD-10-CM | POA: Diagnosis not present

## 2015-12-09 DIAGNOSIS — M62830 Muscle spasm of back: Secondary | ICD-10-CM | POA: Diagnosis not present

## 2015-12-09 DIAGNOSIS — M9903 Segmental and somatic dysfunction of lumbar region: Secondary | ICD-10-CM | POA: Diagnosis not present

## 2015-12-13 DIAGNOSIS — M9904 Segmental and somatic dysfunction of sacral region: Secondary | ICD-10-CM | POA: Diagnosis not present

## 2015-12-13 DIAGNOSIS — M9903 Segmental and somatic dysfunction of lumbar region: Secondary | ICD-10-CM | POA: Diagnosis not present

## 2015-12-13 DIAGNOSIS — M25551 Pain in right hip: Secondary | ICD-10-CM | POA: Diagnosis not present

## 2015-12-13 DIAGNOSIS — M545 Low back pain: Secondary | ICD-10-CM | POA: Diagnosis not present

## 2015-12-13 DIAGNOSIS — M62830 Muscle spasm of back: Secondary | ICD-10-CM | POA: Diagnosis not present

## 2015-12-16 DIAGNOSIS — M62830 Muscle spasm of back: Secondary | ICD-10-CM | POA: Diagnosis not present

## 2015-12-16 DIAGNOSIS — M9904 Segmental and somatic dysfunction of sacral region: Secondary | ICD-10-CM | POA: Diagnosis not present

## 2015-12-16 DIAGNOSIS — M25551 Pain in right hip: Secondary | ICD-10-CM | POA: Diagnosis not present

## 2015-12-16 DIAGNOSIS — M545 Low back pain: Secondary | ICD-10-CM | POA: Diagnosis not present

## 2015-12-16 DIAGNOSIS — M9903 Segmental and somatic dysfunction of lumbar region: Secondary | ICD-10-CM | POA: Diagnosis not present

## 2015-12-23 DIAGNOSIS — M545 Low back pain: Secondary | ICD-10-CM | POA: Diagnosis not present

## 2015-12-23 DIAGNOSIS — M9904 Segmental and somatic dysfunction of sacral region: Secondary | ICD-10-CM | POA: Diagnosis not present

## 2015-12-23 DIAGNOSIS — M25551 Pain in right hip: Secondary | ICD-10-CM | POA: Diagnosis not present

## 2015-12-23 DIAGNOSIS — M62830 Muscle spasm of back: Secondary | ICD-10-CM | POA: Diagnosis not present

## 2015-12-23 DIAGNOSIS — M9903 Segmental and somatic dysfunction of lumbar region: Secondary | ICD-10-CM | POA: Diagnosis not present

## 2015-12-30 DIAGNOSIS — M545 Low back pain: Secondary | ICD-10-CM | POA: Diagnosis not present

## 2015-12-30 DIAGNOSIS — M25551 Pain in right hip: Secondary | ICD-10-CM | POA: Diagnosis not present

## 2015-12-30 DIAGNOSIS — M62830 Muscle spasm of back: Secondary | ICD-10-CM | POA: Diagnosis not present

## 2015-12-30 DIAGNOSIS — M9904 Segmental and somatic dysfunction of sacral region: Secondary | ICD-10-CM | POA: Diagnosis not present

## 2015-12-30 DIAGNOSIS — M9903 Segmental and somatic dysfunction of lumbar region: Secondary | ICD-10-CM | POA: Diagnosis not present

## 2016-01-11 ENCOUNTER — Ambulatory Visit (INDEPENDENT_AMBULATORY_CARE_PROVIDER_SITE_OTHER): Payer: Medicare Other

## 2016-01-11 DIAGNOSIS — Z23 Encounter for immunization: Secondary | ICD-10-CM | POA: Diagnosis not present

## 2016-03-01 DIAGNOSIS — M2041 Other hammer toe(s) (acquired), right foot: Secondary | ICD-10-CM | POA: Diagnosis not present

## 2016-03-01 DIAGNOSIS — M2021 Hallux rigidus, right foot: Secondary | ICD-10-CM | POA: Diagnosis not present

## 2016-03-20 ENCOUNTER — Ambulatory Visit (INDEPENDENT_AMBULATORY_CARE_PROVIDER_SITE_OTHER): Payer: Medicare Other | Admitting: Nurse Practitioner

## 2016-03-20 ENCOUNTER — Encounter: Payer: Self-pay | Admitting: Nurse Practitioner

## 2016-03-20 ENCOUNTER — Ambulatory Visit (INDEPENDENT_AMBULATORY_CARE_PROVIDER_SITE_OTHER)
Admission: RE | Admit: 2016-03-20 | Discharge: 2016-03-20 | Disposition: A | Discharge status: Home / Self Care | Payer: Medicare Other | Source: Ambulatory Visit | Attending: Nurse Practitioner | Admitting: Nurse Practitioner

## 2016-03-20 VITALS — BP 154/72 | HR 72 | Temp 97.8°F | Ht 64.5 in | Wt 175.0 lb

## 2016-03-20 DIAGNOSIS — M7989 Other specified soft tissue disorders: Secondary | ICD-10-CM | POA: Diagnosis not present

## 2016-03-20 DIAGNOSIS — Z23 Encounter for immunization: Secondary | ICD-10-CM

## 2016-03-20 DIAGNOSIS — L03011 Cellulitis of right finger: Secondary | ICD-10-CM

## 2016-03-20 DIAGNOSIS — M79644 Pain in right finger(s): Secondary | ICD-10-CM | POA: Diagnosis not present

## 2016-03-20 IMAGING — DX DG FINGER THUMB 2+V*R*
3 series · 3 of 3 positions shown · non-contrast
Comparison: None.

CLINICAL DATA: Right thumb redness and pain after cut 1 week ago.
Initial encounter.

EXAM:
RIGHT THUMB 2+V

[finger ap]
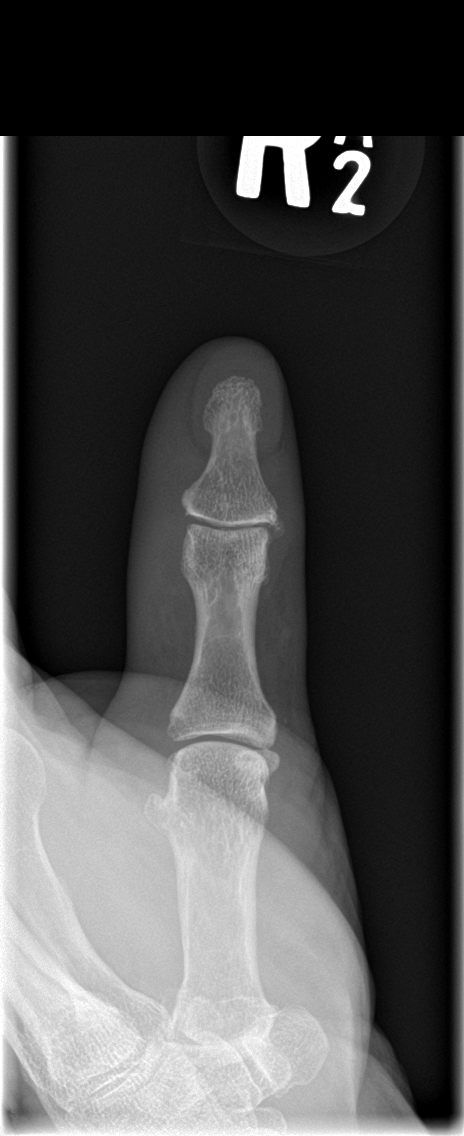

[finger obl]
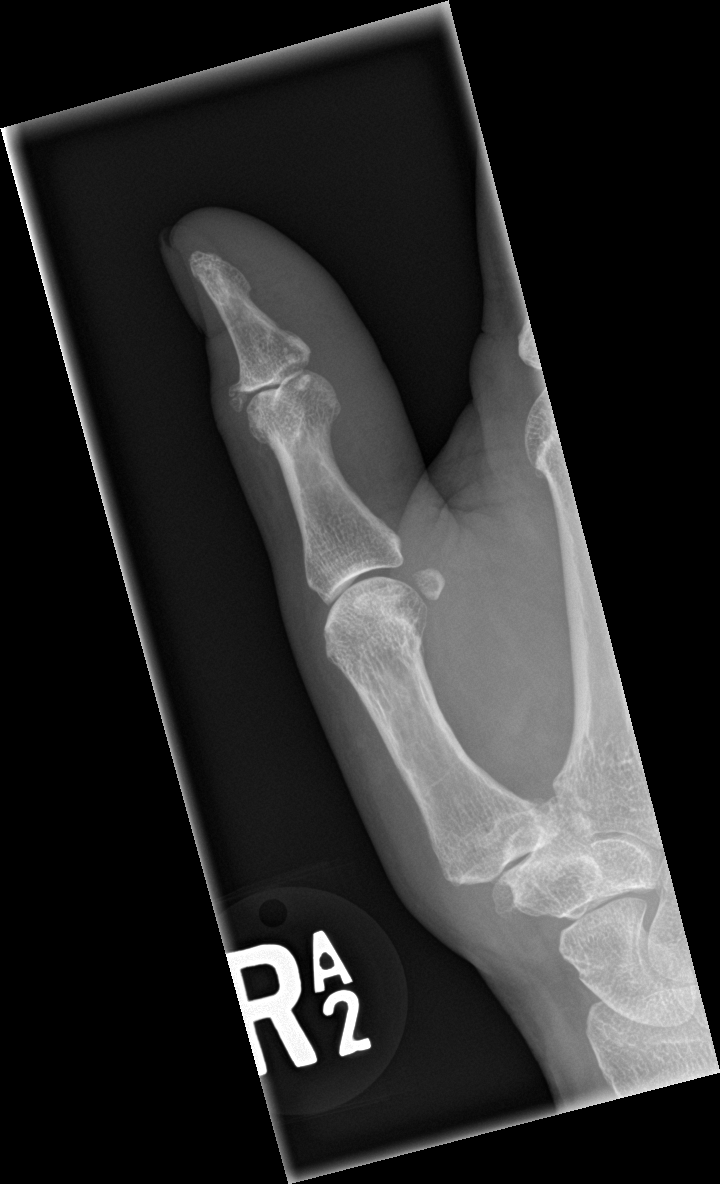

[finger lat]
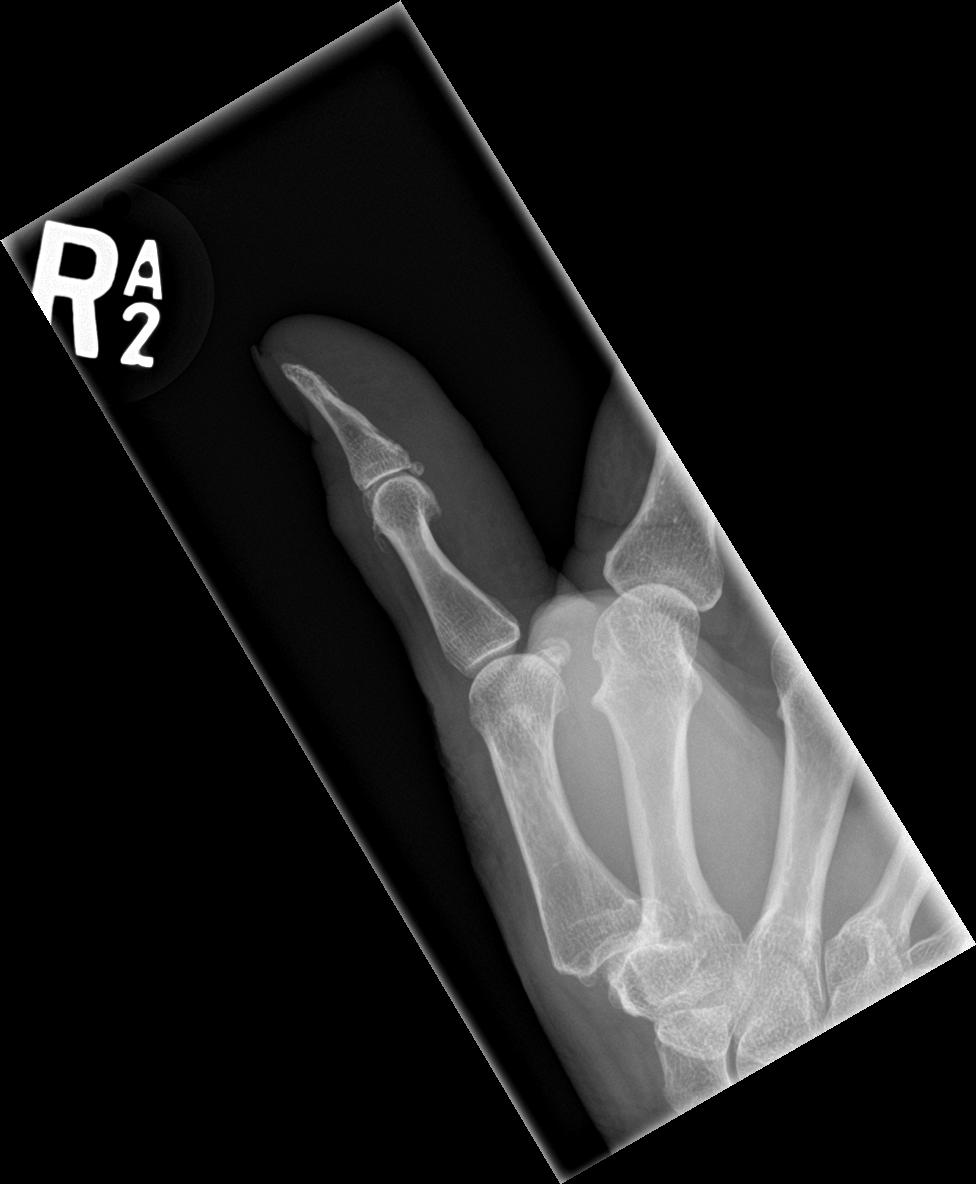

[3 of 3 positions shown; findings below may reference images not displayed]

FINDINGS: Soft tissue swelling without gas or opaque foreign body. No evidence
of osteomyelitis.

Thumb interphalangeal osteoarthritis with spurring. First CMC
osteoarthritis. Probable small osteochondroma of from the ulnar
sided distal first metacarpal.
IMPRESSION: Soft tissue swelling without gas or opaque foreign body. No evidence
of osteomyelitis.

## 2016-03-20 MED ORDER — NAPROXEN 500 MG PO TABS: 500.0000 mg | ORAL_TABLET | Freq: Two times a day (BID) | ORAL | 0 refills | Status: DC | PRN

## 2016-03-20 MED ORDER — DOXYCYCLINE HYCLATE 100 MG PO TABS: 100.0000 mg | ORAL_TABLET | Freq: Two times a day (BID) | ORAL | 0 refills | Status: AC

## 2016-03-20 NOTE — Progress Notes (Signed)
Subjective:  Patient ID: Dana Small, female    DOB: 1945/09/16  Age: 70 y.o. MRN: [id number]  CC: Cellulitis (right thumb swollen,pain,red for 1 wk. not sure when she had her tetanus shot. )  HPI His Rayment presents with acute right thumb redness swelling and pain, Onset 3 days ago. She is unsure of its cause. She does not remember injury to some or any puncture wound. She has difficulty flexing thumb. She denies any fever, no numbness or tingling.  Outpatient Medications Prior to Visit  Medication Sig Dispense Refill  . Multiple Vitamins-Minerals (MULTIVITAMIN WITH MINERALS) tablet Take 1 tablet by mouth daily.    Marland Kitchen triamterene-hydrochlorothiazide (MAXZIDE-25) 37.5-25 MG tablet take 1 tablet by mouth once daily 90 tablet 3   No facility-administered medications prior to visit.     ROS See HPI  Objective:  BP (!) 154/72   Pulse 72   Temp 97.8 F (36.6 C)   Ht 5' 4.5" (1.638 m)   Wt 175 lb (79.4 kg)   SpO2 100%   BMI 29.57 kg/m   BP Readings from Last 3 Encounters:  03/20/16 (!) 154/72  10/11/15 (!) 160/82  06/15/14 126/60    Wt Readings from Last 3 Encounters:  03/20/16 175 lb (79.4 kg)  10/11/15 170 lb (77.1 kg)  07/03/14 164 lb (74.4 kg)    Physical Exam  Constitutional: No distress.  Musculoskeletal:       Right hand: She exhibits decreased range of motion, tenderness and swelling. Normal sensation noted. Normal strength noted.       Hands: Skin: Skin is warm and dry. There is erythema.  Vitals reviewed.   Lab Results  Component Value Date   GLUCOSE 105 (H) 10/11/2015   CHOL 214 (H) 10/11/2015   TRIG 125.0 10/11/2015   HDL 55.20 10/11/2015   LDLCALC 133 (H) 10/11/2015   ALT 21 10/11/2015   AST 20 10/11/2015   NA 140 10/11/2015   K 4.4 10/11/2015   CL 101 10/11/2015   CREATININE 0.68 10/11/2015   BUN 22 10/11/2015   CO2 34 (H) 10/11/2015   TSH 2.34 10/11/2015    Mm Digital Screening Bilateral  Result Date: 11/15/2015 CLINICAL DATA:   Screening. EXAM: DIGITAL SCREENING BILATERAL MAMMOGRAM WITH CAD COMPARISON:  Previous exam(s). ACR Breast Density Category c: The breast tissue is heterogeneously dense, which may obscure small masses. FINDINGS: There are no findings suspicious for malignancy. Images were processed with CAD. IMPRESSION: No mammographic evidence of malignancy. A result letter of this screening mammogram will be mailed directly to the patient. RECOMMENDATION: Screening mammogram in one year. (Code:SM-B-01Y) BI-RADS CATEGORY  1: Negative. Electronically Signed   By: Claudie Revering M.D.   On: 11/15/2015 13:41    Assessment & Plan:   Suhaylah was seen today for cellulitis.  Diagnoses and all orders for this visit:  Swelling of thumb, right -     DG Finger Thumb Right; Future -     doxycycline (VIBRA-TABS) 100 MG tablet; Take 1 tablet (100 mg total) by mouth 2 (two) times daily. -     naproxen (NAPROSYN) 500 MG tablet; Take 1 tablet (500 mg total) by mouth 2 (two) times daily as needed (for pain, take with food).  Cellulitis of thumb, right -     DG Finger Thumb Right; Future -     doxycycline (VIBRA-TABS) 100 MG tablet; Take 1 tablet (100 mg total) by mouth 2 (two) times daily. -     naproxen (NAPROSYN) 500 MG  tablet; Take 1 tablet (500 mg total) by mouth 2 (two) times daily as needed (for pain, take with food).   I am having Ms. Kapusta start on doxycycline and naproxen. I am also having her maintain her multivitamin with minerals and triamterene-hydrochlorothiazide.  Meds ordered this encounter  Medications  . doxycycline (VIBRA-TABS) 100 MG tablet    Sig: Take 1 tablet (100 mg total) by mouth 2 (two) times daily.    Dispense:  14 tablet    Refill:  0    Order Specific Question:   Supervising Provider    Answer:   Cassandria Anger [[id number]]  . naproxen (NAPROSYN) 500 MG tablet    Sig: Take 1 tablet (500 mg total) by mouth 2 (two) times daily as needed (for pain, take with food).    Dispense:  20 tablet     Refill:  0    Order Specific Question:   Supervising Provider    Answer:   Cassandria Anger [[id number]]    Follow-up: Return in about 1 week (around 03/27/2016) for thumb cellulitis.Wilfred Lacy, NP

## 2016-03-20 NOTE — Progress Notes (Signed)
Pre visit review using our clinic review tool, if applicable. No additional management support is needed unless otherwise documented below in the visit note. 

## 2016-03-20 NOTE — Patient Instructions (Addendum)
Return to office if no improvement in 3 days.  Make appointment to reevaluate right thumb in 1 week.  Elevate right arm as much as possible to help decrease swelling.  Thumb x-ray: No foreign object, no osteomyelitis. Arthritis and soft tissue swelling

## 2016-05-30 DIAGNOSIS — H5213 Myopia, bilateral: Secondary | ICD-10-CM | POA: Diagnosis not present

## 2016-05-30 DIAGNOSIS — H40013 Open angle with borderline findings, low risk, bilateral: Secondary | ICD-10-CM | POA: Diagnosis not present

## 2016-07-05 DIAGNOSIS — L820 Inflamed seborrheic keratosis: Secondary | ICD-10-CM | POA: Diagnosis not present

## 2016-07-05 DIAGNOSIS — L309 Dermatitis, unspecified: Secondary | ICD-10-CM | POA: Diagnosis not present

## 2016-07-05 DIAGNOSIS — L821 Other seborrheic keratosis: Secondary | ICD-10-CM | POA: Diagnosis not present

## 2016-07-05 DIAGNOSIS — L918 Other hypertrophic disorders of the skin: Secondary | ICD-10-CM | POA: Diagnosis not present

## 2016-07-05 DIAGNOSIS — Z23 Encounter for immunization: Secondary | ICD-10-CM | POA: Diagnosis not present

## 2016-09-12 ENCOUNTER — Other Ambulatory Visit: Payer: Self-pay | Admitting: Internal Medicine

## 2016-10-11 ENCOUNTER — Encounter: Payer: Medicare Other | Admitting: Internal Medicine

## 2016-10-11 ENCOUNTER — Other Ambulatory Visit (INDEPENDENT_AMBULATORY_CARE_PROVIDER_SITE_OTHER): Payer: Medicare Other

## 2016-10-11 ENCOUNTER — Encounter: Payer: Self-pay | Admitting: Nurse Practitioner

## 2016-10-11 ENCOUNTER — Ambulatory Visit (INDEPENDENT_AMBULATORY_CARE_PROVIDER_SITE_OTHER): Payer: Medicare Other | Admitting: Nurse Practitioner

## 2016-10-11 VITALS — BP 152/72 | HR 57 | Temp 97.8°F | Ht 64.5 in | Wt 169.0 lb

## 2016-10-11 DIAGNOSIS — Z13220 Encounter for screening for lipoid disorders: Secondary | ICD-10-CM | POA: Diagnosis not present

## 2016-10-11 DIAGNOSIS — I10 Essential (primary) hypertension: Secondary | ICD-10-CM

## 2016-10-11 DIAGNOSIS — M19041 Primary osteoarthritis, right hand: Secondary | ICD-10-CM | POA: Diagnosis not present

## 2016-10-11 DIAGNOSIS — M19042 Primary osteoarthritis, left hand: Secondary | ICD-10-CM | POA: Diagnosis not present

## 2016-10-11 DIAGNOSIS — Z136 Encounter for screening for cardiovascular disorders: Secondary | ICD-10-CM | POA: Diagnosis not present

## 2016-10-11 DIAGNOSIS — E782 Mixed hyperlipidemia: Secondary | ICD-10-CM | POA: Diagnosis not present

## 2016-10-11 DIAGNOSIS — Z23 Encounter for immunization: Secondary | ICD-10-CM

## 2016-10-11 DIAGNOSIS — M8588 Other specified disorders of bone density and structure, other site: Secondary | ICD-10-CM

## 2016-10-11 LAB — BASIC METABOLIC PANEL
BUN: 18 mg/dL (ref 6–23)
CALCIUM: 9.6 mg/dL (ref 8.4–10.5)
CO2: 31 mEq/L (ref 19–32)
Chloride: 102 mEq/L (ref 96–112)
Creatinine, Ser: 0.67 mg/dL (ref 0.40–1.20)
GFR: 92.3 mL/min (ref >60.00)
GLUCOSE: 99 mg/dL (ref 70–99)
POTASSIUM: 3.8 meq/L (ref 3.5–5.1)
SODIUM: 139 meq/L (ref 135–145)

## 2016-10-11 LAB — LIPID PANEL
CHOLESTEROL: 208 mg/dL — AB (ref 0–200)
HDL: 55.2 mg/dL (ref >39.00)
LDL Cholesterol: 136 mg/dL — ABNORMAL HIGH (ref 0–99)
NONHDL: 152.68
TRIGLYCERIDES: 84 mg/dL (ref 0.0–149.0)
Total CHOL/HDL Ratio: 4
VLDL: 16.8 mg/dL (ref 0.0–40.0)

## 2016-10-11 MED ORDER — ZOSTER VAC RECOMB ADJUVANTED 50 MCG/0.5ML IM SUSR: 0.5000 mL | Freq: Once | INTRAMUSCULAR | 0 refills | Status: AC

## 2016-10-11 MED ORDER — TRIAMTERENE-HCTZ 37.5-25 MG PO TABS: 1.0000 | ORAL_TABLET | Freq: Every day | ORAL | 3 refills | Status: DC

## 2016-10-11 NOTE — Progress Notes (Signed)
Subjective:  Patient ID: Dana Small, female    DOB: Jan 16, 1946  Age: 71 y.o. MRN: [id number]  CC: Annual Exam (yearly check up)   HPI  denies any acute complains.  HTN: No adverse effects with maxzide. Stable BP >140/80.  Outpatient Medications Prior to Visit  Medication Sig Dispense Refill  . Multiple Vitamins-Minerals (MULTIVITAMIN WITH MINERALS) tablet Take 1 tablet by mouth daily.    Marland Kitchen triamterene-hydrochlorothiazide (MAXZIDE-25) 37.5-25 MG tablet take 1 tablet by mouth once daily 30 tablet 0  . naproxen (NAPROSYN) 500 MG tablet Take 1 tablet (500 mg total) by mouth 2 (two) times daily as needed (for pain, take with food). (Patient not taking: Reported on 10/11/2016) 20 tablet 0   No facility-administered medications prior to visit.     ROS Review of Systems  Constitutional: Negative for fever, malaise/fatigue and weight loss.  HENT: Negative for congestion and sore throat.   Eyes:       Negative for visual changes  Respiratory: Negative for cough and shortness of breath.   Cardiovascular: Negative for chest pain, palpitations and leg swelling.  Gastrointestinal: Negative for blood in stool, constipation, diarrhea and heartburn.  Genitourinary: Negative for dysuria, frequency and urgency.  Musculoskeletal: Negative for falls, joint pain and myalgias.  Skin: Negative for rash.  Neurological: Negative for dizziness, sensory change and headaches.  Endo/Heme/Allergies: Does not bruise/bleed easily.  Psychiatric/Behavioral: Negative for depression, substance abuse and suicidal ideas. The patient is not nervous/anxious.     Objective:  BP (!) 152/72   Pulse (!) 57   Temp 97.8 F (36.6 C)   Ht 5' 4.5" (1.638 m)   Wt 169 lb (76.7 kg)   SpO2 98%   BMI 28.56 kg/m   BP Readings from Last 3 Encounters:  10/11/16 (!) 152/72  03/20/16 (!) 154/72  10/11/15 (!) 160/82    Wt Readings from Last 3 Encounters:  10/11/16 169 lb (76.7 kg)  03/20/16 175 lb (79.4 kg)    10/11/15 170 lb (77.1 kg)    Physical Exam  Constitutional: She is oriented to person, place, and time. No distress.  HENT:  Right Ear: External ear normal.  Left Ear: External ear normal.  Nose: Nose normal.  Mouth/Throat: No oropharyngeal exudate.  Eyes: No scleral icterus.  Neck: Normal range of motion. Neck supple. No thyromegaly present.  Cardiovascular: Normal rate, regular rhythm and normal heart sounds.   Pulmonary/Chest: Effort normal and breath sounds normal. No respiratory distress.  Abdominal: Soft. She exhibits no distension.  Musculoskeletal: Normal range of motion. She exhibits no edema.  Neurological: She is alert and oriented to person, place, and time.  Skin: Skin is warm and dry.  Psychiatric: She has a normal mood and affect. Her behavior is normal.  Vitals reviewed.   Lab Results  Component Value Date   GLUCOSE 99 10/11/2016   CHOL 208 (H) 10/11/2016   TRIG 84.0 10/11/2016   HDL 55.20 10/11/2016   LDLCALC 136 (H) 10/11/2016   ALT 21 10/11/2015   AST 20 10/11/2015   NA 139 10/11/2016   K 3.8 10/11/2016   CL 102 10/11/2016   CREATININE 0.67 10/11/2016   BUN 18 10/11/2016   CO2 31 10/11/2016   TSH 2.34 10/11/2015    Dg Finger Thumb Right  Result Date: 03/20/2016 CLINICAL DATA:  Right thumb redness and pain after cut 1 week ago. Initial encounter. EXAM: RIGHT THUMB 2+V COMPARISON:  None. FINDINGS: Soft tissue swelling without gas or opaque foreign body. No evidence  of osteomyelitis. Thumb interphalangeal osteoarthritis with spurring. First CMC osteoarthritis. Probable small osteochondroma of from the ulnar sided distal first metacarpal. IMPRESSION: Soft tissue swelling without gas or opaque foreign body. No evidence of osteomyelitis. Electronically Signed   By: Monte Fantasia M.D.   On: 03/20/2016 10:56    Assessment & Plan:   Erryn was seen today for annual exam.  Diagnoses and all orders for this visit:  Essential hypertension -     Basic  metabolic panel; Future  Osteoarthritis of both hands, unspecified osteoarthritis type  Encounter for lipid screening for cardiovascular disease -     Lipid panel; Future  Osteopenia of lumbar spine -     DG Bone Density; Future  Need for viral immunization -     Zoster Vac Recomb Adjuvanted [facility]) injection; Inject 0.5 mLs into the muscle once.   I have discontinued Ms. Neils's naproxen. I am also having her start on Zoster Vac Recomb Adjuvanted. Additionally, I am having her maintain her multivitamin with minerals, triamterene-hydrochlorothiazide, vitamin C, Calcium-Magnesium-Vitamin D (CALCIUM 500 PO), and cyanocobalamin.  Meds ordered this encounter  Medications  . Ascorbic Acid (VITAMIN C) 1000 MG tablet    Sig: Take 1,000 mg by mouth daily.  . Calcium-Magnesium-Vitamin D (CALCIUM 500 PO)    Sig: Take by mouth.  . cyanocobalamin 1000 MCG tablet    Sig: Take 1,000 mcg by mouth daily.  Marland Kitchen Zoster Vac Recomb Adjuvanted [facility] 2) injection    Sig: Inject 0.5 mLs into the muscle once.    Dispense:  0.5 mL    Refill:  0    Order Specific Question:   Supervising Provider    Answer:   Cassandria Anger [[id number]]    Follow-up: Return in about 6 months (around 04/12/2017) for HTN with Dr. Sharlet Salina.  Wilfred Lacy, NP

## 2016-10-11 NOTE — Patient Instructions (Signed)
Go to basement for blood draw.  You will be called with results.   Arthritis Arthritis means joint pain. It can also mean joint disease. A joint is a place where bones come together. People who have arthritis may have:  Red joints.  Swollen joints.  Stiff joints.  Warm joints.  A fever.  A feeling of being sick.  Follow these instructions at home: Pay attention to any changes in your symptoms. Take these actions to help with your pain and swelling. Medicines  Take over-the-counter and prescription medicines only as told by your doctor.  Do not take aspirin for pain if your doctor says that you may have gout. Activity  Rest your joint if your doctor tells you to.  Avoid activities that make the pain worse.  Exercise your joint regularly as told by your doctor. Try doing exercises like: ? Swimming. ? Water aerobics. ? Biking. ? Walking. Joint Care   If your joint is swollen, keep it raised (elevated) if told by your doctor.  If your joint feels stiff in the morning, try taking a warm shower.  If you have diabetes, do not apply heat without asking your doctor.  If told, apply heat to the joint: ? Put a towel between the joint and the hot pack or heating pad. ? Leave the heat on the area for 20-30 minutes.  If told, apply ice to the joint: ? Put ice in a plastic bag. ? Place a towel between your skin and the bag. ? Leave the ice on for 20 minutes, 2-3 times per day.  Keep all follow-up visits as told by your doctor. Contact a doctor if:  The pain gets worse.  You have a fever. Get help right away if:  You have very bad pain in your joint.  You have swelling in your joint.  Your joint is red.  Many joints become painful and swollen.  You have very bad back pain.  Your leg is very weak.  You cannot control your pee (urine) or poop (stool). This information is not intended to replace advice given to you by your health care provider. Make sure you  discuss any questions you have with your health care provider. Document Released: 06/28/2009 Document Revised: 09/09/2015 Document Reviewed: 06/29/2014 Elsevier Interactive Patient Education  2018 Dana Small Maintenance, Female Adopting a healthy lifestyle and getting preventive care can go a long way to promote health and wellness. Talk with your health care provider about what schedule of regular examinations is right for you. This is a good chance for you to check in with your provider about disease prevention and staying healthy. In between checkups, there are plenty of things you can do on your own. Experts have done a lot of research about which lifestyle changes and preventive measures are most likely to keep you healthy. Ask your health care provider for more information. Weight and diet Eat a healthy diet  Be sure to include plenty of vegetables, fruits, low-fat dairy products, and lean protein.  Do not eat a lot of foods high in solid fats, added sugars, or salt.  Get regular exercise. This is one of the most important things you can do for your health. ? Most adults should exercise for at least 150 minutes each week. The exercise should increase your heart rate and make you sweat (moderate-intensity exercise). ? Most adults should also do strengthening exercises at least twice a week. This is in addition to the moderate-intensity exercise.  Maintain a healthy weight  Body mass index (BMI) is a measurement that can be used to identify possible weight problems. It estimates body fat based on height and weight. Your health care provider can help determine your BMI and help you achieve or maintain a healthy weight.  For females 28 years of age and older: ? A BMI below 18.5 is considered underweight. ? A BMI of 18.5 to 24.9 is normal. ? A BMI of 25 to 29.9 is considered overweight. ? A BMI of 30 and above is considered obese.  Watch levels of cholesterol and blood  lipids  You should start having your blood tested for lipids and cholesterol at 71 years of age, then have this test every 5 years.  You may need to have your cholesterol levels checked more often if: ? Your lipid or cholesterol levels are high. ? You are older than 71 years of age. ? You are at high risk for heart disease.  Cancer screening Lung Cancer  Lung cancer screening is recommended for adults 72-70 years old who are at high risk for lung cancer because of a history of smoking.  A yearly low-dose CT scan of the lungs is recommended for people who: ? Currently smoke. ? Have quit within the past 15 years. ? Have at least a 30-pack-year history of smoking. A pack year is smoking an average of one pack of cigarettes a day for 1 year.  Yearly screening should continue until it has been 15 years since you quit.  Yearly screening should stop if you develop a health problem that would prevent you from having lung cancer treatment.  Breast Cancer  Practice breast self-awareness. This means understanding how your breasts normally appear and feel.  It also means doing regular breast self-exams. Let your health care provider know about any changes, no matter how small.  If you are in your 20s or 30s, you should have a clinical breast exam (CBE) by a health care provider every 1-3 years as part of a regular health exam.  If you are 60 or older, have a CBE every year. Also consider having a breast X-ray (mammogram) every year.  If you have a family history of breast cancer, talk to your health care provider about genetic screening.  If you are at high risk for breast cancer, talk to your health care provider about having an MRI and a mammogram every year.  Breast cancer gene (BRCA) assessment is recommended for women who have family members with BRCA-related cancers. BRCA-related cancers include: ? Breast. ? Ovarian. ? Tubal. ? Peritoneal cancers.  Results of the assessment will  determine the need for genetic counseling and BRCA1 and BRCA2 testing.  Cervical Cancer Your health care provider may recommend that you be screened regularly for cancer of the pelvic organs (ovaries, uterus, and vagina). This screening involves a pelvic examination, including checking for microscopic changes to the surface of your cervix (Pap test). You may be encouraged to have this screening done every 3 years, beginning at age 58.  For women ages 77-65, health care providers may recommend pelvic exams and Pap testing every 3 years, or they may recommend the Pap and pelvic exam, combined with testing for human papilloma virus (HPV), every 5 years. Some types of HPV increase your risk of cervical cancer. Testing for HPV may also be done on women of any age with unclear Pap test results.  Other health care providers may not recommend any screening for nonpregnant women  who are considered low risk for pelvic cancer and who do not have symptoms. Ask your health care provider if a screening pelvic exam is right for you.  If you have had past treatment for cervical cancer or a condition that could lead to cancer, you need Pap tests and screening for cancer for at least 20 years after your treatment. If Pap tests have been discontinued, your risk factors (such as having a new sexual partner) need to be reassessed to determine if screening should resume. Some women have medical problems that increase the chance of getting cervical cancer. In these cases, your health care provider may recommend more frequent screening and Pap tests.  Colorectal Cancer  This type of cancer can be detected and often prevented.  Routine colorectal cancer screening usually begins at 71 years of age and continues through 71 years of age.  Your health care provider may recommend screening at an earlier age if you have risk factors for colon cancer.  Your health care provider may also recommend using home test kits to check  for hidden blood in the stool.  A small camera at the end of a tube can be used to examine your colon directly (sigmoidoscopy or colonoscopy). This is done to check for the earliest forms of colorectal cancer.  Routine screening usually begins at age 79.  Direct examination of the colon should be repeated every 5-10 years through 71 years of age. However, you may need to be screened more often if early forms of precancerous polyps or small growths are found.  Skin Cancer  Check your skin from head to toe regularly.  Tell your health care provider about any new moles or changes in moles, especially if there is a change in a mole's shape or color.  Also tell your health care provider if you have a mole that is larger than the size of a pencil eraser.  Always use sunscreen. Apply sunscreen liberally and repeatedly throughout the day.  Protect yourself by wearing long sleeves, pants, a wide-brimmed hat, and sunglasses whenever you are outside.  Heart disease, diabetes, and high blood pressure  High blood pressure causes heart disease and increases the risk of stroke. High blood pressure is more likely to develop in: ? People who have blood pressure in the high end of the normal range (130-139/85-89 mm Hg). ? People who are overweight or obese. ? People who are African American.  If you are 51-22 years of age, have your blood pressure checked every 3-5 years. If you are 57 years of age or older, have your blood pressure checked every year. You should have your blood pressure measured twice-once when you are at a hospital or clinic, and once when you are not at a hospital or clinic. Record the average of the two measurements. To check your blood pressure when you are not at a hospital or clinic, you can use: ? An automated blood pressure machine at a pharmacy. ? A home blood pressure monitor.  If you are between 6 years and 40 years old, ask your health care provider if you should take  aspirin to prevent strokes.  Have regular diabetes screenings. This involves taking a blood sample to check your fasting blood sugar level. ? If you are at a normal weight and have a low risk for diabetes, have this test once every three years after 71 years of age. ? If you are overweight and have a high risk for diabetes, consider being tested  at a younger age or more often. Preventing infection Hepatitis B  If you have a higher risk for hepatitis B, you should be screened for this virus. You are considered at high risk for hepatitis B if: ? You were born in a country where hepatitis B is common. Ask your health care provider which countries are considered high risk. ? Your parents were born in a high-risk country, and you have not been immunized against hepatitis B (hepatitis B vaccine). ? You have HIV or AIDS. ? You use needles to inject street drugs. ? You live with someone who has hepatitis B. ? You have had sex with someone who has hepatitis B. ? You get hemodialysis treatment. ? You take certain medicines for conditions, including cancer, organ transplantation, and autoimmune conditions.  Hepatitis C  Blood testing is recommended for: ? Everyone born from [birth year] through 1965. ? Anyone with known risk factors for hepatitis C.  Sexually transmitted infections (STIs)  You should be screened for sexually transmitted infections (STIs) including gonorrhea and chlamydia if: ? You are sexually active and are younger than 71 years of age. ? You are older than 71 years of age and your health care provider tells you that you are at risk for this type of infection. ? Your sexual activity has changed since you were last screened and you are at an increased risk for chlamydia or gonorrhea. Ask your health care provider if you are at risk.  If you do not have HIV, but are at risk, it may be recommended that you take a prescription medicine daily to prevent HIV infection. This is called  pre-exposure prophylaxis (PrEP). You are considered at risk if: ? You are sexually active and do not regularly use condoms or know the HIV status of your partner(s). ? You take drugs by injection. ? You are sexually active with a partner who has HIV.  Talk with your health care provider about whether you are at high risk of being infected with HIV. If you choose to begin PrEP, you should first be tested for HIV. You should then be tested every 3 months for as long as you are taking PrEP. Pregnancy  If you are premenopausal and you may become pregnant, ask your health care provider about preconception counseling.  If you may become pregnant, take 400 to 800 micrograms (mcg) of folic acid every day.  If you want to prevent pregnancy, talk to your health care provider about birth control (contraception). Osteoporosis and menopause  Osteoporosis is a disease in which the bones lose minerals and strength with aging. This can result in serious bone fractures. Your risk for osteoporosis can be identified using a bone density scan.  If you are 72 years of age or older, or if you are at risk for osteoporosis and fractures, ask your health care provider if you should be screened.  Ask your health care provider whether you should take a calcium or vitamin D supplement to lower your risk for osteoporosis.  Menopause may have certain physical symptoms and risks.  Hormone replacement therapy may reduce some of these symptoms and risks. Talk to your health care provider about whether hormone replacement therapy is right for you. Follow these instructions at home:  Schedule regular health, dental, and eye exams.  Stay current with your immunizations.  Do not use any tobacco products including cigarettes, chewing tobacco, or electronic cigarettes.  If you are pregnant, do not drink alcohol.  If you are breastfeeding, limit  how much and how often you drink alcohol.  Limit alcohol intake to no more  than 1 drink per day for nonpregnant women. One drink equals 12 ounces of beer, 5 ounces of wine, or 1 ounces of hard liquor.  Do not use street drugs.  Do not share needles.  Ask your health care provider for help if you need support or information about quitting drugs.  Tell your health care provider if you often feel depressed.  Tell your health care provider if you have ever been abused or do not feel safe at home. This information is not intended to replace advice given to you by your health care provider. Make sure you discuss any questions you have with your health care provider. Document Released: 10/17/2010 Document Revised: 09/09/2015 Document Reviewed: 01/05/2015 Elsevier Interactive Patient Education  [employer].

## 2016-10-12 ENCOUNTER — Telehealth: Payer: Self-pay | Admitting: Nurse Practitioner

## 2016-10-12 DIAGNOSIS — E782 Mixed hyperlipidemia: Secondary | ICD-10-CM

## 2016-10-12 DIAGNOSIS — I10 Essential (primary) hypertension: Secondary | ICD-10-CM

## 2016-10-12 NOTE — Telephone Encounter (Signed)
-----   Message from Shawnie Pons, LPN sent at 2/42/6834 10:50 AM EDT ----- Pt verbalize understand of test result.   Pt request to be on cholesterol medication to help bring it down, she said her number is high and she has been exercise and diet for years (not helping). Pt doesn't like simvastatin (due to what it did to her mother). [facility] pharmacy, please advise.

## 2016-10-13 MED ORDER — TRIAMTERENE-HCTZ 37.5-25 MG PO TABS: 1.0000 | ORAL_TABLET | Freq: Every day | ORAL | 3 refills | Status: DC

## 2016-10-13 MED ORDER — PRAVASTATIN SODIUM 20 MG PO TABS: 20.0000 mg | ORAL_TABLET | Freq: Every day | ORAL | 1 refills | Status: DC

## 2016-10-13 NOTE — Telephone Encounter (Signed)
Bother Rx resend to [facility]. Pharmacy updated.

## 2016-10-13 NOTE — Addendum Note (Signed)
Addended byShawnie Pons on: 10/13/2016 09:39 AM   Modules accepted: Orders

## 2016-10-20 ENCOUNTER — Other Ambulatory Visit: Payer: Self-pay | Admitting: Internal Medicine

## 2016-10-20 DIAGNOSIS — Z1231 Encounter for screening mammogram for malignant neoplasm of breast: Secondary | ICD-10-CM

## 2016-11-08 ENCOUNTER — Ambulatory Visit
Admission: RE | Admit: 2016-11-08 | Discharge: 2016-11-08 | Disposition: A | Discharge status: Home / Self Care | Payer: Medicare Other | Source: Ambulatory Visit | Attending: Nurse Practitioner | Admitting: Nurse Practitioner

## 2016-11-08 ENCOUNTER — Ambulatory Visit
Admission: RE | Admit: 2016-11-08 | Discharge: 2016-11-08 | Disposition: A | Discharge status: Home / Self Care | Payer: Medicare Other | Source: Ambulatory Visit | Attending: Internal Medicine | Admitting: Internal Medicine

## 2016-11-08 DIAGNOSIS — M8588 Other specified disorders of bone density and structure, other site: Secondary | ICD-10-CM

## 2016-11-08 DIAGNOSIS — Z780 Asymptomatic menopausal state: Secondary | ICD-10-CM | POA: Diagnosis not present

## 2016-11-08 DIAGNOSIS — M85852 Other specified disorders of bone density and structure, left thigh: Secondary | ICD-10-CM | POA: Diagnosis not present

## 2016-11-08 DIAGNOSIS — Z1231 Encounter for screening mammogram for malignant neoplasm of breast: Secondary | ICD-10-CM

## 2016-12-13 DIAGNOSIS — M2041 Other hammer toe(s) (acquired), right foot: Secondary | ICD-10-CM | POA: Diagnosis not present

## 2016-12-13 DIAGNOSIS — M2021 Hallux rigidus, right foot: Secondary | ICD-10-CM | POA: Diagnosis not present

## 2016-12-13 DIAGNOSIS — M7671 Peroneal tendinitis, right leg: Secondary | ICD-10-CM | POA: Diagnosis not present

## 2016-12-25 DIAGNOSIS — M25551 Pain in right hip: Secondary | ICD-10-CM | POA: Diagnosis not present

## 2016-12-27 DIAGNOSIS — M7671 Peroneal tendinitis, right leg: Secondary | ICD-10-CM | POA: Diagnosis not present

## 2017-01-01 ENCOUNTER — Ambulatory Visit (INDEPENDENT_AMBULATORY_CARE_PROVIDER_SITE_OTHER): Payer: Medicare Other

## 2017-01-01 DIAGNOSIS — M7671 Peroneal tendinitis, right leg: Secondary | ICD-10-CM | POA: Diagnosis not present

## 2017-01-01 DIAGNOSIS — Z411 Encounter for cosmetic surgery: Secondary | ICD-10-CM | POA: Diagnosis not present

## 2017-01-01 DIAGNOSIS — Z23 Encounter for immunization: Secondary | ICD-10-CM

## 2017-01-01 DIAGNOSIS — L821 Other seborrheic keratosis: Secondary | ICD-10-CM | POA: Diagnosis not present

## 2017-01-01 DIAGNOSIS — D485 Neoplasm of uncertain behavior of skin: Secondary | ICD-10-CM | POA: Diagnosis not present

## 2017-01-01 DIAGNOSIS — L570 Actinic keratosis: Secondary | ICD-10-CM | POA: Diagnosis not present

## 2017-01-01 DIAGNOSIS — Z85828 Personal history of other malignant neoplasm of skin: Secondary | ICD-10-CM | POA: Diagnosis not present

## 2017-01-03 DIAGNOSIS — M7671 Peroneal tendinitis, right leg: Secondary | ICD-10-CM | POA: Diagnosis not present

## 2017-01-10 DIAGNOSIS — M25551 Pain in right hip: Secondary | ICD-10-CM | POA: Diagnosis not present

## 2017-01-17 DIAGNOSIS — M25551 Pain in right hip: Secondary | ICD-10-CM | POA: Diagnosis not present

## 2017-01-19 DIAGNOSIS — M25551 Pain in right hip: Secondary | ICD-10-CM | POA: Diagnosis not present

## 2017-01-24 DIAGNOSIS — M25551 Pain in right hip: Secondary | ICD-10-CM | POA: Diagnosis not present

## 2017-01-26 DIAGNOSIS — M25551 Pain in right hip: Secondary | ICD-10-CM | POA: Diagnosis not present

## 2017-01-29 DIAGNOSIS — M25551 Pain in right hip: Secondary | ICD-10-CM | POA: Diagnosis not present

## 2017-01-31 DIAGNOSIS — M25551 Pain in right hip: Secondary | ICD-10-CM | POA: Diagnosis not present

## 2017-03-16 ENCOUNTER — Telehealth: Payer: Self-pay | Admitting: Internal Medicine

## 2017-03-16 NOTE — Telephone Encounter (Signed)
Spoke with Dana Small regarding AWV. Patient stated that she is not interested in scheduling a wellness visit at this time. Marland Kitchen

## 2017-06-28 ENCOUNTER — Ambulatory Visit (INDEPENDENT_AMBULATORY_CARE_PROVIDER_SITE_OTHER): Payer: Self-pay | Admitting: *Deleted

## 2017-06-28 DIAGNOSIS — I781 Nevus, non-neoplastic: Secondary | ICD-10-CM

## 2017-06-28 NOTE — Progress Notes (Signed)
X=.3% Sotradecol administered with a 27g butterfly.  Patient received a total of 18cc.  Photos: Yes.    Compression stockings applied: Yes.     Lots of large spiders scattered all over her legs. Easy access. She jumped occ. Anticipate good results though she will probably heal slowly. Follow prn.

## 2017-06-29 ENCOUNTER — Encounter: Payer: Self-pay | Admitting: Vascular Surgery

## 2017-07-03 ENCOUNTER — Ambulatory Visit: Payer: Self-pay | Admitting: *Deleted

## 2017-07-03 NOTE — Telephone Encounter (Signed)
LVM with patient for her to call back, CMR created

## 2017-07-03 NOTE — Telephone Encounter (Signed)
Patient calling because she would like to have some antibiotics called into the [facility] [place]. Stated that she has an infection under her right thumb.

## 2017-07-03 NOTE — Telephone Encounter (Signed)
Patient is calling to report that she has previous damage to her nails from using gel polish and she had been treated for nail infection previously. She worked in her garden this weekend and although she was wearing gloves- she seems to have irritated her Right thumb. It is red and sore and she feels she may be headed for infection in that digit. She would like to get an antibiotic for it. I told her the provider would need to see her- but she states she has had this before and she has a class today at 1:30 to 4. She states she will follow up if the treatment does not work. I told her the office would let her know if she could be treated or if she would need to come in- she can be reached at her cell # [phone]. She uses [facility]. Reason for Disposition . [1] Red area or streak AND [2] no fever  Answer Assessment - Initial Assessment Questions 1. LOCATION: "Where is the wound located?"      R thumb nailbed/cutacul  2. WOUND APPEARANCE: "What does the wound look like?"      Red at top of finger and hot 3. SIZE: If redness is present, ask: "What is the size of the red area?" (Inches, centimeters, or compare to size of a coin)      Yes- all around thumb 4. SPREAD: "What's changed in the last day?"  "Do you see any red streaks coming from the wound?"     No streaks- not getting better 5. ONSET: "When did it start to look infected?"      Sunday 6. MECHANISM: "How did the wound start, what was the cause?"     gardening 7. PAIN: "Is there any pain?" If so, ask: "How bad is the pain?"   (Scale 1-10; or mild, moderate, severe)     Yes- hurts to use 8. FEVER: "Do you have a fever?" If so, ask: "What is your temperature, how was it measured, and when did it start?"     no 9. OTHER SYMPTOMS: "Do you have any other symptoms?" (e.g., shaking chills, weakness, rash elsewhere on body)     No- hard to grip 10. PREGNANCY: "Is there any chance you are pregnant?" "When was your last menstrual  period?"       n/a  Protocols used: WOUND INFECTION-A-AH

## 2017-07-03 NOTE — Telephone Encounter (Signed)
Can you please make patient an office visit thank you

## 2017-07-03 NOTE — Telephone Encounter (Signed)
I have not seen since 2017 so would need visit.

## 2017-07-05 ENCOUNTER — Ambulatory Visit (INDEPENDENT_AMBULATORY_CARE_PROVIDER_SITE_OTHER): Payer: Medicare Other | Admitting: Family Medicine

## 2017-07-05 ENCOUNTER — Encounter: Payer: Self-pay | Admitting: Family Medicine

## 2017-07-05 VITALS — BP 132/82 | HR 52 | Temp 97.9°F | Ht 64.5 in | Wt 175.0 lb

## 2017-07-05 DIAGNOSIS — B351 Tinea unguium: Secondary | ICD-10-CM

## 2017-07-05 DIAGNOSIS — L03011 Cellulitis of right finger: Secondary | ICD-10-CM | POA: Diagnosis not present

## 2017-07-05 MED ORDER — DOXYCYCLINE HYCLATE 100 MG PO TABS: 100.0000 mg | ORAL_TABLET | Freq: Two times a day (BID) | ORAL | 0 refills | Status: DC

## 2017-07-05 NOTE — Progress Notes (Signed)
    Subjective:  Dana Small is a 72 y.o. female who presents today for same-day appointment with a chief complaint of right thumb Small.   HPI:  Right thumb Small, acute issue Symptoms started 4 days ago.  Located to her right thumb.  Patient is afraid that she has a infection.  She tried using a needle to scrape out the infection which did not help.  No fevers or chills.  Small is mild.  She has little bit of redness to her right thumb as well.  She had a similar episode a couple of years ago that was treated with doxycycline.  No drainage from the area.  Thinks that symptoms started while she was out gardening and thinks that she may have gotten something stuck under her nail.  No other obvious alleviating or aggravating factors.  ROS: Per HPI  PMH: She reports that she has never smoked. She has never used smokeless tobacco. She reports that she drinks alcohol. She reports that she does not use drugs.  Objective:  Physical Exam: BP 132/82 (BP Location: Right Arm)   Pulse (!) 52   Temp 97.9 F (36.6 C)   Ht 5' 4.5" (1.638 m)   Wt 175 lb (79.4 kg)   SpO2 99%   BMI 29.57 kg/m   Gen: NAD, resting comfortably MSK: -Right thumb: Onychomycosis.  Mild erythema to radial nail fold with minimal tenderness on palpation.  No areas of fluctuance.  No drainage.  Neurovascularly intact distally. -Left thumb: Onychomycosis.  No erythema or tenderness.  No drainage. Please see below pictures:      Assessment/Plan:  Right thumb cellulitis No red flag signs or symptoms.  No signs of systemic illness.  No areas amenable to I&D.  Start doxycycline for 10-day course.  Patient has underlying onychomycosis which is likely predisposing her to nail bed injury and bacterial superinfection.  Discussed strict return precautions.  She will follow-up as needed.  Onychomycosis Likely started due to frequent manicures.  Advised patient to follow-up with PCP to discuss treatment options.  Dana Small. Dana Pain,  MD 07/05/2017 9:32 AM

## 2017-07-05 NOTE — Patient Instructions (Addendum)
Start the doxycycline. Let me know if your symptoms are not improving.  Take care, Dr Jerline Pain

## 2017-07-09 ENCOUNTER — Telehealth: Payer: Self-pay | Admitting: Internal Medicine

## 2017-07-09 NOTE — Telephone Encounter (Signed)
Spoke with Dana Small regarding AWV. Pt declined AWV at this time. SF

## 2017-07-18 DIAGNOSIS — L219 Seborrheic dermatitis, unspecified: Secondary | ICD-10-CM | POA: Diagnosis not present

## 2017-07-18 DIAGNOSIS — L821 Other seborrheic keratosis: Secondary | ICD-10-CM | POA: Diagnosis not present

## 2017-07-18 DIAGNOSIS — L720 Epidermal cyst: Secondary | ICD-10-CM | POA: Diagnosis not present

## 2017-07-18 DIAGNOSIS — L570 Actinic keratosis: Secondary | ICD-10-CM | POA: Diagnosis not present

## 2017-07-31 DIAGNOSIS — L603 Nail dystrophy: Secondary | ICD-10-CM | POA: Diagnosis not present

## 2017-07-31 DIAGNOSIS — B372 Candidiasis of skin and nail: Secondary | ICD-10-CM | POA: Diagnosis not present

## 2017-08-02 ENCOUNTER — Other Ambulatory Visit: Payer: Self-pay

## 2017-08-02 DIAGNOSIS — I10 Essential (primary) hypertension: Secondary | ICD-10-CM

## 2017-08-02 MED ORDER — TRIAMTERENE-HCTZ 37.5-25 MG PO TABS: 1.0000 | ORAL_TABLET | Freq: Every day | ORAL | 0 refills | Status: DC

## 2017-08-15 DIAGNOSIS — B351 Tinea unguium: Secondary | ICD-10-CM | POA: Diagnosis not present

## 2017-08-15 DIAGNOSIS — Z79899 Other long term (current) drug therapy: Secondary | ICD-10-CM | POA: Diagnosis not present

## 2017-09-18 DIAGNOSIS — B351 Tinea unguium: Secondary | ICD-10-CM | POA: Diagnosis not present

## 2017-09-18 DIAGNOSIS — Z79899 Other long term (current) drug therapy: Secondary | ICD-10-CM | POA: Diagnosis not present

## 2017-09-27 DIAGNOSIS — M9904 Segmental and somatic dysfunction of sacral region: Secondary | ICD-10-CM | POA: Diagnosis not present

## 2017-09-27 DIAGNOSIS — M62830 Muscle spasm of back: Secondary | ICD-10-CM | POA: Diagnosis not present

## 2017-09-27 DIAGNOSIS — M9903 Segmental and somatic dysfunction of lumbar region: Secondary | ICD-10-CM | POA: Diagnosis not present

## 2017-10-10 DIAGNOSIS — H25013 Cortical age-related cataract, bilateral: Secondary | ICD-10-CM | POA: Diagnosis not present

## 2017-10-10 DIAGNOSIS — H40013 Open angle with borderline findings, low risk, bilateral: Secondary | ICD-10-CM | POA: Diagnosis not present

## 2017-10-10 DIAGNOSIS — H5213 Myopia, bilateral: Secondary | ICD-10-CM | POA: Diagnosis not present

## 2017-10-10 DIAGNOSIS — H52203 Unspecified astigmatism, bilateral: Secondary | ICD-10-CM | POA: Diagnosis not present

## 2017-10-23 ENCOUNTER — Other Ambulatory Visit (INDEPENDENT_AMBULATORY_CARE_PROVIDER_SITE_OTHER): Payer: Medicare Other

## 2017-10-23 ENCOUNTER — Encounter: Payer: Self-pay | Admitting: Internal Medicine

## 2017-10-23 ENCOUNTER — Ambulatory Visit (INDEPENDENT_AMBULATORY_CARE_PROVIDER_SITE_OTHER): Payer: Medicare Other | Admitting: Internal Medicine

## 2017-10-23 VITALS — BP 118/70 | HR 69 | Temp 97.9°F | Ht 64.5 in | Wt 172.0 lb

## 2017-10-23 DIAGNOSIS — I10 Essential (primary) hypertension: Secondary | ICD-10-CM

## 2017-10-23 DIAGNOSIS — M19049 Primary osteoarthritis, unspecified hand: Secondary | ICD-10-CM

## 2017-10-23 DIAGNOSIS — E559 Vitamin D deficiency, unspecified: Secondary | ICD-10-CM

## 2017-10-23 DIAGNOSIS — E782 Mixed hyperlipidemia: Secondary | ICD-10-CM | POA: Diagnosis not present

## 2017-10-23 DIAGNOSIS — H9191 Unspecified hearing loss, right ear: Secondary | ICD-10-CM | POA: Diagnosis not present

## 2017-10-23 DIAGNOSIS — Z0000 Encounter for general adult medical examination without abnormal findings: Secondary | ICD-10-CM | POA: Diagnosis not present

## 2017-10-23 LAB — CBC
HEMATOCRIT: 42.3 % (ref 36.0–46.0)
HEMOGLOBIN: 14.2 g/dL (ref 12.0–15.0)
MCHC: 33.5 g/dL (ref 30.0–36.0)
MCV: 94.2 fl (ref 78.0–100.0)
Platelets: 188 10*3/uL (ref 150.0–400.0)
RBC: 4.49 Mil/uL (ref 3.87–5.11)
RDW: 13.1 % (ref 11.5–15.5)
WBC: 5.3 10*3/uL (ref 4.0–10.5)

## 2017-10-23 LAB — COMPREHENSIVE METABOLIC PANEL
ALK PHOS: 67 U/L (ref 39–117)
ALT: 23 U/L (ref 0–35)
AST: 22 U/L (ref 0–37)
Albumin: 4.3 g/dL (ref 3.5–5.2)
BUN: 18 mg/dL (ref 6–23)
CHLORIDE: 103 meq/L (ref 96–112)
CO2: 33 meq/L — AB (ref 19–32)
Calcium: 9.8 mg/dL (ref 8.4–10.5)
Creatinine, Ser: 0.79 mg/dL (ref 0.40–1.20)
GFR: 76.1 mL/min (ref >60.00)
GLUCOSE: 114 mg/dL — AB (ref 70–99)
POTASSIUM: 4.7 meq/L (ref 3.5–5.1)
SODIUM: 140 meq/L (ref 135–145)
TOTAL PROTEIN: 7.5 g/dL (ref 6.0–8.3)
Total Bilirubin: 0.4 mg/dL (ref 0.2–1.2)

## 2017-10-23 LAB — TSH: TSH: 2.87 u[IU]/mL (ref 0.35–4.50)

## 2017-10-23 LAB — LIPID PANEL
Cholesterol: 179 mg/dL (ref 0–200)
HDL: 52.8 mg/dL (ref >39.00)
LDL CALC: 99 mg/dL (ref 0–99)
NONHDL: 126.5
Total CHOL/HDL Ratio: 3
Triglycerides: 137 mg/dL (ref 0.0–149.0)
VLDL: 27.4 mg/dL (ref 0.0–40.0)

## 2017-10-23 LAB — VITAMIN D 25 HYDROXY (VIT D DEFICIENCY, FRACTURES): VITD: 34.79 ng/mL (ref 30.00–100.00)

## 2017-10-23 NOTE — Patient Instructions (Signed)

## 2017-10-23 NOTE — Progress Notes (Signed)
   Subjective:    Patient ID: Dana Small, female    DOB: [birth year], 72 y.o.   MRN: [id number]  HPI Here for medicare wellness, no new complaints. Please see A/P for status and treatment of chronic medical problems.   HPI #2: Here for follow up blood pressure (taking hctz and denies side effects, denies chest pains or SOB or headaches, denies dizziness) and hearing loss right ear (slightly progressive, does not have hearing aid currently but will get in the next few years, due to cost), and osteoarthritis (she is active and this is helping, she does not like to take medicine for this).   Diet: heart healthy Physical activity: sedentary Depression/mood screen: negative Hearing: intact to whispered voice left, right with moderate to severe loss Visual acuity: grossly normal, performs annual eye exam  ADLs: capable Fall risk: none Home safety: good Cognitive evaluation: intact to orientation, naming, recall and repetition EOL planning: adv directives discussed  I have personally reviewed and have noted 1. The patient's medical and social history - reviewed today no changes 2. Their use of alcohol, tobacco or illicit drugs 3. Their current medications and supplements 4. The patient's functional ability including ADL's, fall risks, home safety risks and hearing or visual impairment. 5. Diet and physical activities 6. Evidence for depression or mood disorders 7. Care team reviewed and updated (available in snapshot)  Review of Systems  Constitutional: Negative.   HENT: Positive for hearing loss.   Eyes: Negative.   Respiratory: Negative for cough, chest tightness and shortness of breath.   Cardiovascular: Negative for chest pain, palpitations and leg swelling.  Gastrointestinal: Negative for abdominal distention, abdominal pain, constipation, diarrhea, nausea and vomiting.  Musculoskeletal: Positive for arthralgias. Negative for back pain, gait problem, joint swelling and myalgias.    Skin: Negative.   Neurological: Negative.   Psychiatric/Behavioral: Negative.       Objective:   Physical Exam  Constitutional: She is oriented to person, place, and time. She appears well-developed and well-nourished.  HENT:  Head: Normocephalic and atraumatic.  Eyes: EOM are normal.  Neck: Normal range of motion.  Cardiovascular: Normal rate and regular rhythm.  Pulmonary/Chest: Effort normal and breath sounds normal. No respiratory distress. She has no wheezes. She has no rales.  Abdominal: Soft. Bowel sounds are normal. She exhibits no distension. There is no tenderness. There is no rebound.  Musculoskeletal: She exhibits no edema.  Neurological: She is alert and oriented to person, place, and time. Coordination normal.  Skin: Skin is warm and dry.  Psychiatric: She has a normal mood and affect.   Vitals:   10/23/17 1259  BP: 118/70  Pulse: 69  Temp: 97.9 F (36.6 C)  TempSrc: Oral  SpO2: 97%  Weight: 172 lb (78 kg)  Height: 5' 4.5" (1.638 m)      Assessment & Plan:

## 2017-10-26 NOTE — Assessment & Plan Note (Signed)
Stable and not taking meds currently. Talked about continued exercise to help prevent progression and turmeric if needed. Taking calcium.

## 2017-10-26 NOTE — Assessment & Plan Note (Signed)
Checking CMP and adjust as needed. Taking hctz currently with BP at goal.

## 2017-10-26 NOTE — Assessment & Plan Note (Signed)
Checking lipid panel and not on meds. If lipids good will remove this problem. She does exercise regularly.

## 2017-10-26 NOTE — Assessment & Plan Note (Signed)
Stable and will get hearing aid when able.

## 2017-10-26 NOTE — Assessment & Plan Note (Signed)
Flu shot yearly. Pneumonia declines today. Shingrix counseled. Tetanus up to date. Colonoscopy up to date. Mammogram up to date, pap smear not indicated and dexa up to date. Counseled about sun safety and mole surveillance. Counseled about the dangers of distracted driving. Given 10 year screening recommendations.

## 2017-10-29 ENCOUNTER — Other Ambulatory Visit: Payer: Self-pay | Admitting: Internal Medicine

## 2017-10-29 DIAGNOSIS — I10 Essential (primary) hypertension: Secondary | ICD-10-CM

## 2017-12-21 ENCOUNTER — Other Ambulatory Visit: Payer: Self-pay | Admitting: Internal Medicine

## 2017-12-21 DIAGNOSIS — Z1231 Encounter for screening mammogram for malignant neoplasm of breast: Secondary | ICD-10-CM

## 2018-01-09 DIAGNOSIS — L821 Other seborrheic keratosis: Secondary | ICD-10-CM | POA: Diagnosis not present

## 2018-01-09 DIAGNOSIS — L219 Seborrheic dermatitis, unspecified: Secondary | ICD-10-CM | POA: Diagnosis not present

## 2018-01-09 DIAGNOSIS — B351 Tinea unguium: Secondary | ICD-10-CM | POA: Diagnosis not present

## 2018-01-16 ENCOUNTER — Ambulatory Visit
Admission: RE | Admit: 2018-01-16 | Discharge: 2018-01-16 | Disposition: A | Discharge status: Home / Self Care | Payer: Medicare Other | Source: Ambulatory Visit | Attending: Internal Medicine | Admitting: Internal Medicine

## 2018-01-16 DIAGNOSIS — Z1231 Encounter for screening mammogram for malignant neoplasm of breast: Secondary | ICD-10-CM | POA: Diagnosis not present

## 2018-01-16 IMAGING — MG DIGITAL SCREENING BILATERAL MAMMOGRAM WITH TOMO AND CAD
8 series · 8 of 24 positions shown · non-contrast
Comparison: Previous exam(s).

CLINICAL DATA: Screening.

EXAM:
DIGITAL SCREENING BILATERAL MAMMOGRAM WITH TOMO AND CAD

[R MLO synth-2D]
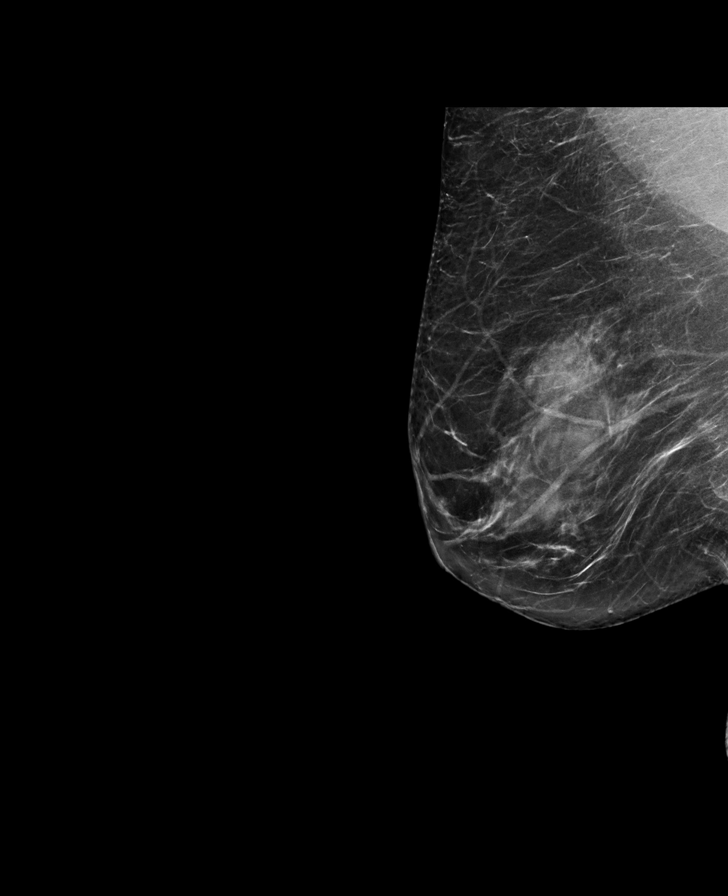

[R CC synth-2D]
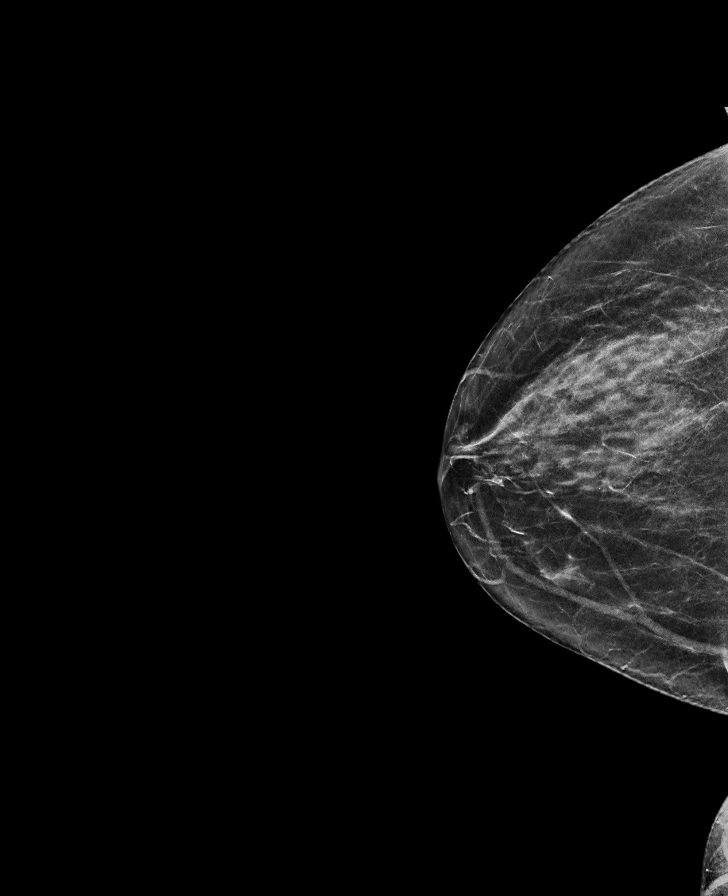

[L CC synth-2D]
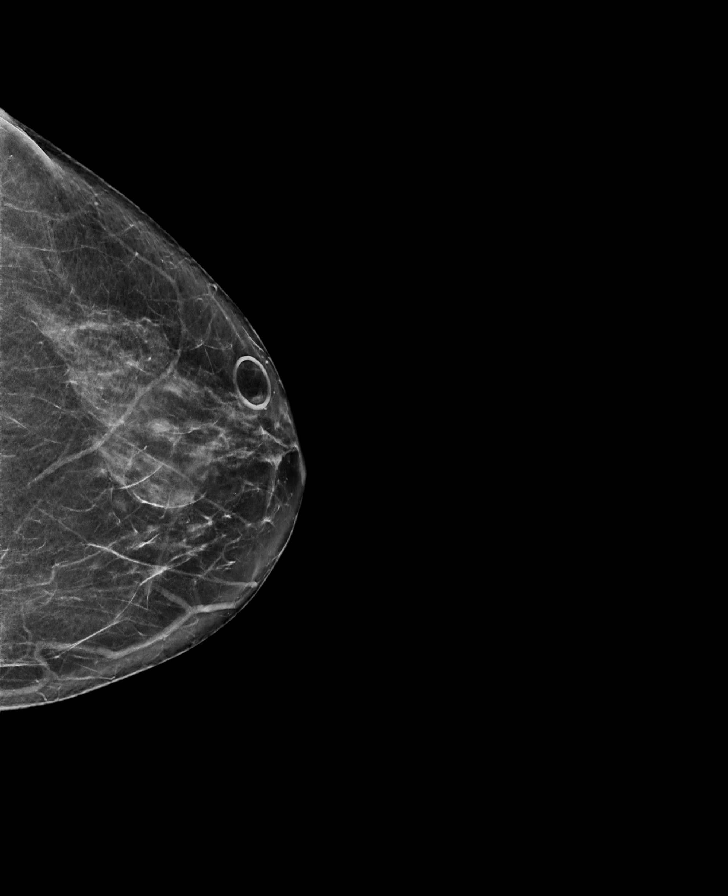

[L MLO synth-2D]
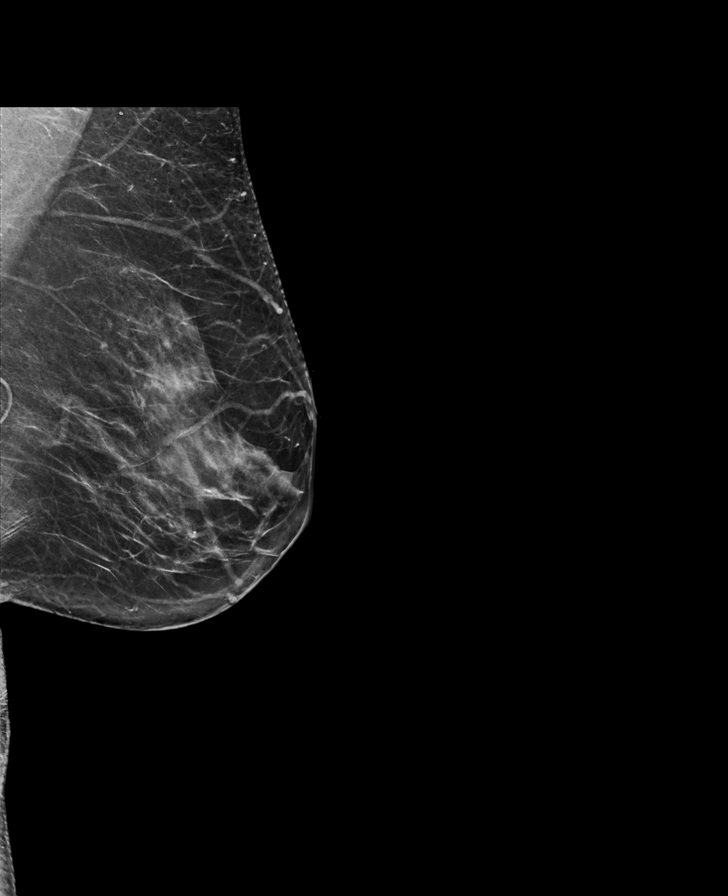

[L CC tomo · tomo slice 34/67.0]
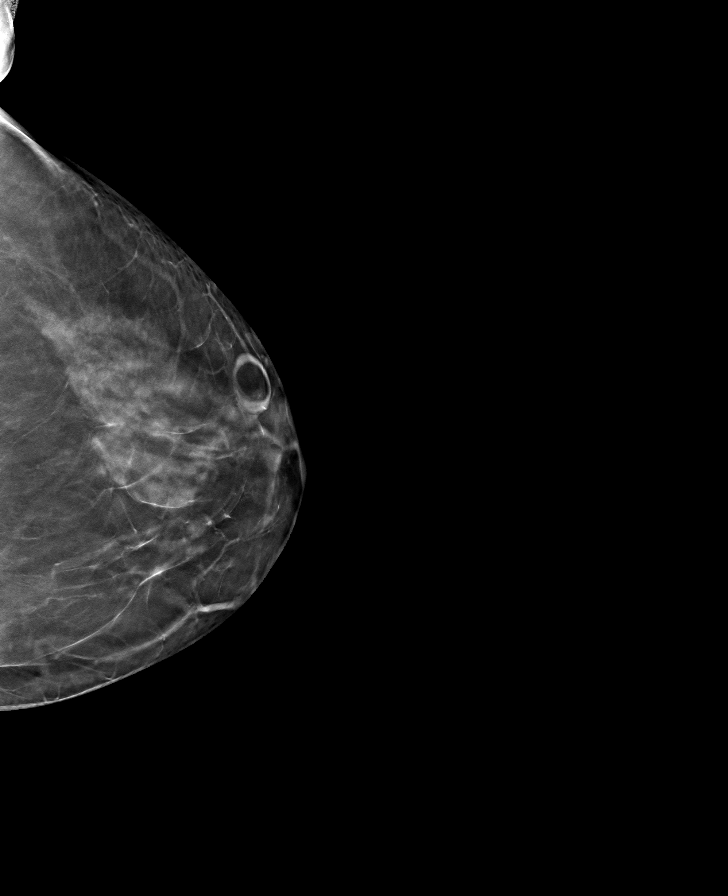

[L MLO tomo · tomo slice 36/71.0]
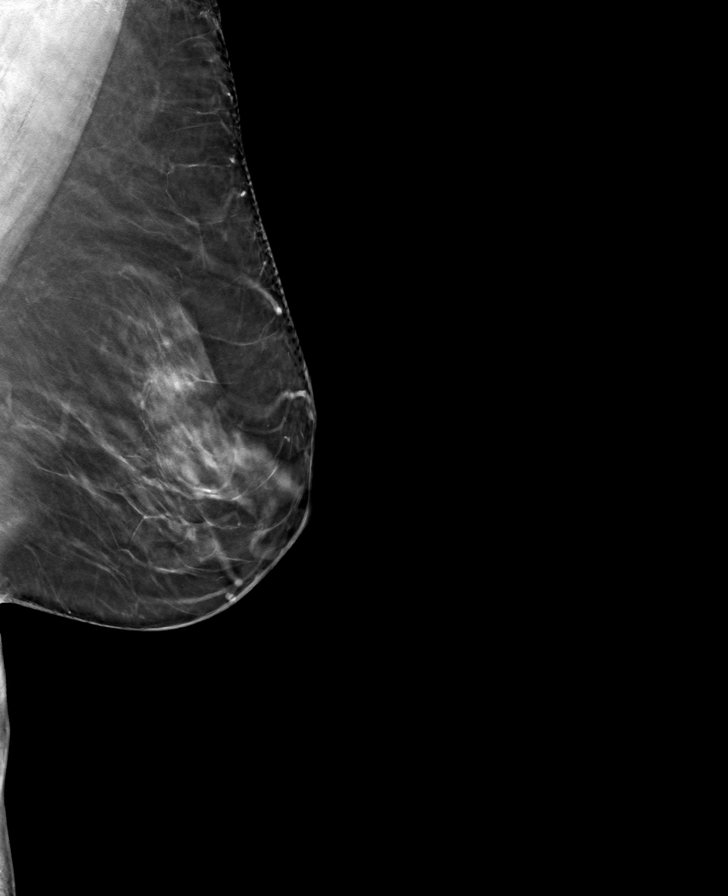

[R MLO tomo · tomo slice 35/70.0]
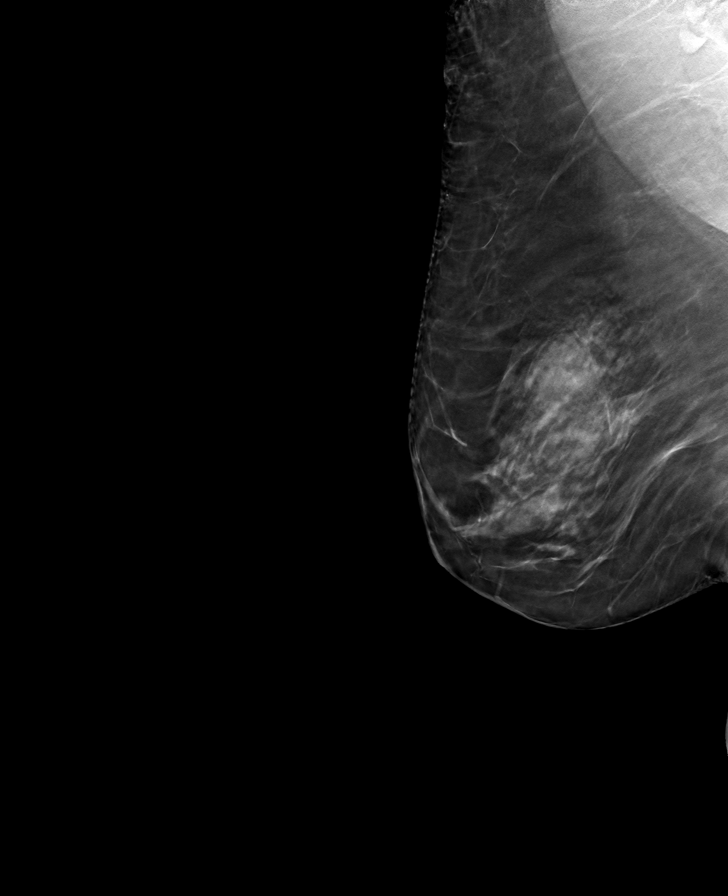

[R CC tomo · tomo slice 30/59.0]
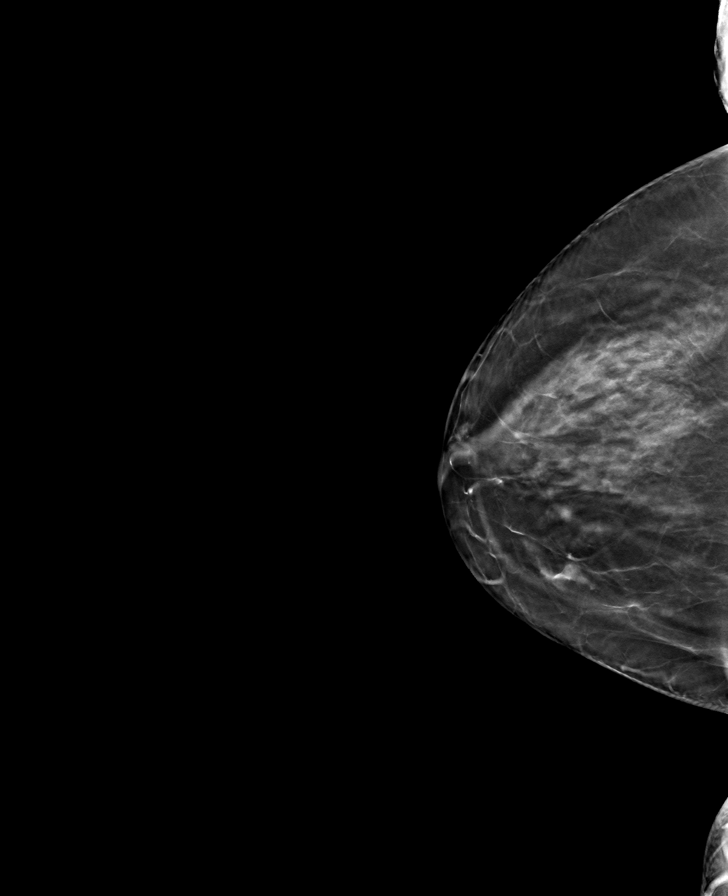

[8 of 24 positions shown; findings below may reference images not displayed]

ACR Breast Density Category c: The breast tissue is heterogeneously
dense, which may obscure small masses.
FINDINGS: There are no findings suspicious for malignancy. Images were
processed with CAD.
IMPRESSION: No mammographic evidence of malignancy. A result letter of this
screening mammogram will be mailed directly to the patient.

RECOMMENDATION:
Screening mammogram in one year. (Code:[5V])

BI-RADS CATEGORY  1: Negative.

## 2018-01-24 ENCOUNTER — Ambulatory Visit (INDEPENDENT_AMBULATORY_CARE_PROVIDER_SITE_OTHER): Payer: Medicare Other | Admitting: *Deleted

## 2018-01-24 DIAGNOSIS — Z23 Encounter for immunization: Secondary | ICD-10-CM

## 2018-04-08 ENCOUNTER — Ambulatory Visit: Payer: Self-pay | Admitting: *Deleted

## 2018-04-08 NOTE — Telephone Encounter (Signed)
Contacted pt regarding symptoms; she says that it is at the top of hip on the right (not on the joint); she says that it is above her buttocks; the pt says that she took advil 04/08/18 at 0900 and her pain is getting better; recommendations made per nurse triage protocol; the pt was previously scheduled for appointment with Dr Pricilla Holm, [facility], 04/09/18 at 0900 per [facility] agent Candice; will route to office for notification     Reason for Disposition . [1] MODERATE pain (e.g., interferes with normal activities, limping) AND [2] present > 3 days  Answer Assessment - Initial Assessment Questions 1. LOCATION and RADIATION: "Where is the pain located?"      Right hip 2. QUALITY: "What does the pain feel like?"  (e.g., sharp, dull, aching, burning)     Dull pain but becomes sharp with bending, twisting, or turning 3. SEVERITY: "How bad is the pain?" "What does it keep you from doing?"   (Scale 1-10; or mild, moderate, severe)   -  MILD (1-3): doesn't interfere with normal activities    -  MODERATE (4-7): interferes with normal activities (e.g., work or school) or awakens from sleep, limping    -  SEVERE (8-10): excruciating pain, unable to do any normal activities, unable to walk     Moderate 4-7 out of 10 4. ONSET: "When did the pain start?" "Does it come and go, or is it there all the time?"     7-10 days ago 5. WORK OR EXERCISE: "Has there been any recent work or exercise that involved this part of the body?"      Lifting class prior to this  6. CAUSE: "What do you think is causing the hip pain?"      Not sure 7. AGGRAVATING FACTORS: "What makes the hip pain worse?" (e.g., walking, climbing stairs, running)     Raked leaves on 04/05/18 8. OTHER SYMPTOMS: "Do you have any other symptoms?" (e.g., back pain, pain shooting down leg,  fever, rash)     no  Protocols used: HIP PAIN-A-AH

## 2018-04-08 NOTE — Telephone Encounter (Signed)
Noted  

## 2018-04-09 ENCOUNTER — Encounter: Payer: Self-pay | Admitting: Internal Medicine

## 2018-04-09 ENCOUNTER — Ambulatory Visit (INDEPENDENT_AMBULATORY_CARE_PROVIDER_SITE_OTHER): Payer: Medicare Other | Admitting: Internal Medicine

## 2018-04-09 VITALS — BP 150/80 | HR 68 | Temp 98.1°F | Ht 64.5 in | Wt 173.0 lb

## 2018-04-09 DIAGNOSIS — M533 Sacrococcygeal disorders, not elsewhere classified: Secondary | ICD-10-CM | POA: Insufficient documentation

## 2018-04-09 MED ORDER — CYCLOBENZAPRINE HCL 5 MG PO TABS: 5.0000 mg | ORAL_TABLET | Freq: Three times a day (TID) | ORAL | 1 refills | Status: DC | PRN

## 2018-04-09 NOTE — Patient Instructions (Signed)
We have sent in a muscle relaxer flexeril that you can use for pain.

## 2018-04-09 NOTE — Assessment & Plan Note (Signed)
Rx for flexeril for night time if needed. Continue ibuprofen as this is helping. Heat/ice as needed.

## 2018-04-09 NOTE — Progress Notes (Signed)
   Subjective:   Patient ID: Dana Small, female    DOB: 12-Jan-1946, 72 y.o.   MRN: [id number]  HPI The patient is a 72 YO female coming in for new low back pain. Does not radiate anywhere. About 5/10 pain. Started about 3-4 days ago. Overall it is improving. She is taking ibuprofen and using heat/ice. Did pull the area with a new trainer in the last couple of weeks and felt it starting then. She has tried to avoid those motions. Pain with twisting pelvis and has to be careful getting out of bed. She denies fevers or chills. Denies numbness or weakness.   Review of Systems  Constitutional: Positive for activity change. Negative for appetite change, chills, fatigue, fever and unexpected weight change.  Respiratory: Negative.   Cardiovascular: Negative.   Gastrointestinal: Negative.   Musculoskeletal: Positive for arthralgias, back pain and myalgias. Negative for gait problem and joint swelling.  Skin: Negative.   Neurological: Negative.     Objective:  Physical Exam Constitutional:      Appearance: She is well-developed.  HENT:     Head: Normocephalic and atraumatic.  Neck:     Musculoskeletal: Normal range of motion.  Cardiovascular:     Rate and Rhythm: Normal rate and regular rhythm.  Pulmonary:     Effort: Pulmonary effort is normal. No respiratory distress.     Breath sounds: Normal breath sounds. No wheezing or rales.  Abdominal:     General: There is no distension.     Palpations: Abdomen is soft.     Tenderness: There is no abdominal tenderness. There is no rebound.  Musculoskeletal:        General: Tenderness present.     Comments: Pain right SI joint, no radiation.   Skin:    General: Skin is warm and dry.  Neurological:     Mental Status: She is alert and oriented to person, place, and time.     Coordination: Coordination normal.     Vitals:   04/09/18 0855  BP: (!) 150/80  Pulse: 68  Temp: 98.1 F (36.7 C)  TempSrc: Oral  SpO2: 99%  Weight: 173 lb  (78.5 kg)  Height: 5' 4.5" (1.638 m)    Assessment & Plan:

## 2018-04-22 DIAGNOSIS — M9902 Segmental and somatic dysfunction of thoracic region: Secondary | ICD-10-CM | POA: Diagnosis not present

## 2018-04-22 DIAGNOSIS — M9903 Segmental and somatic dysfunction of lumbar region: Secondary | ICD-10-CM | POA: Diagnosis not present

## 2018-04-22 DIAGNOSIS — M62830 Muscle spasm of back: Secondary | ICD-10-CM | POA: Diagnosis not present

## 2018-04-22 DIAGNOSIS — M9904 Segmental and somatic dysfunction of sacral region: Secondary | ICD-10-CM | POA: Diagnosis not present

## 2018-04-25 DIAGNOSIS — M9903 Segmental and somatic dysfunction of lumbar region: Secondary | ICD-10-CM | POA: Diagnosis not present

## 2018-04-25 DIAGNOSIS — M62830 Muscle spasm of back: Secondary | ICD-10-CM | POA: Diagnosis not present

## 2018-04-25 DIAGNOSIS — M9904 Segmental and somatic dysfunction of sacral region: Secondary | ICD-10-CM | POA: Diagnosis not present

## 2018-04-25 DIAGNOSIS — M9902 Segmental and somatic dysfunction of thoracic region: Secondary | ICD-10-CM | POA: Diagnosis not present

## 2018-05-17 ENCOUNTER — Encounter: Payer: Self-pay | Admitting: Internal Medicine

## 2018-05-17 ENCOUNTER — Ambulatory Visit (INDEPENDENT_AMBULATORY_CARE_PROVIDER_SITE_OTHER): Payer: Medicare Other | Admitting: Internal Medicine

## 2018-05-17 ENCOUNTER — Other Ambulatory Visit (INDEPENDENT_AMBULATORY_CARE_PROVIDER_SITE_OTHER): Payer: Medicare Other

## 2018-05-17 VITALS — BP 124/72 | HR 81 | Temp 98.8°F | Ht 64.5 in | Wt 174.0 lb

## 2018-05-17 DIAGNOSIS — K6289 Other specified diseases of anus and rectum: Secondary | ICD-10-CM

## 2018-05-17 DIAGNOSIS — R197 Diarrhea, unspecified: Secondary | ICD-10-CM | POA: Diagnosis not present

## 2018-05-17 DIAGNOSIS — I10 Essential (primary) hypertension: Secondary | ICD-10-CM

## 2018-05-17 LAB — CBC WITH DIFFERENTIAL/PLATELET
Basophils Absolute: 0 10*3/uL (ref 0.0–0.1)
Basophils Relative: 0.3 % (ref 0.0–3.0)
EOS PCT: 0.3 % (ref 0.0–5.0)
Eosinophils Absolute: 0 10*3/uL (ref 0.0–0.7)
HCT: 41.3 % (ref 36.0–46.0)
HEMOGLOBIN: 14.1 g/dL (ref 12.0–15.0)
Lymphocytes Relative: 17 % (ref 12.0–46.0)
Lymphs Abs: 1.5 10*3/uL (ref 0.7–4.0)
MCHC: 34.2 g/dL (ref 30.0–36.0)
MCV: 93.8 fl (ref 78.0–100.0)
MONO ABS: 0.6 10*3/uL (ref 0.1–1.0)
Monocytes Relative: 6.7 % (ref 3.0–12.0)
Neutro Abs: 6.7 10*3/uL (ref 1.4–7.7)
Neutrophils Relative %: 75.7 % (ref 43.0–77.0)
Platelets: 217 10*3/uL (ref 150.0–400.0)
RBC: 4.4 Mil/uL (ref 3.87–5.11)
RDW: 13.1 % (ref 11.5–15.5)
WBC: 8.8 10*3/uL (ref 4.0–10.5)

## 2018-05-17 LAB — HEPATIC FUNCTION PANEL
ALK PHOS: 71 U/L (ref 39–117)
ALT: 24 U/L (ref 0–35)
AST: 21 U/L (ref 0–37)
Albumin: 4.4 g/dL (ref 3.5–5.2)
BILIRUBIN DIRECT: 0.1 mg/dL (ref 0.0–0.3)
BILIRUBIN TOTAL: 0.6 mg/dL (ref 0.2–1.2)
Total Protein: 7.5 g/dL (ref 6.0–8.3)

## 2018-05-17 LAB — BASIC METABOLIC PANEL
BUN: 17 mg/dL (ref 6–23)
CALCIUM: 10 mg/dL (ref 8.4–10.5)
CO2: 30 mEq/L (ref 19–32)
CREATININE: 0.64 mg/dL (ref 0.40–1.20)
Chloride: 99 mEq/L (ref 96–112)
GFR: 91.14 mL/min (ref >60.00)
GLUCOSE: 100 mg/dL — AB (ref 70–99)
Potassium: 3.8 mEq/L (ref 3.5–5.1)
SODIUM: 138 meq/L (ref 135–145)

## 2018-05-17 LAB — LIPASE: LIPASE: 17 U/L (ref 11.0–59.0)

## 2018-05-17 NOTE — Assessment & Plan Note (Signed)
stable overall by history and exam, recent data reviewed with pt, and pt to continue medical treatment as before,  to f/u any worsening symptoms or concerns  

## 2018-05-17 NOTE — Patient Instructions (Signed)
Ok to take tylenol or rare  advil as needed for pain  Please continue all other medications as before, and refills have been done if requested.  Please have the pharmacy call with any other refills you may need.  You will be contacted regarding the referral for: Gastroenterology  Please keep your appointments with your specialists as you may have planned  Please go to the LAB in the Basement (turn left off the elevator) for the tests to be done today - blood work and stool testing  You will be contacted by phone if any changes need to be made immediately.  Otherwise, you will receive a letter about your results with an explanation, but please check with MyChart first.  Please remember to sign up for MyChart if you have not done so, as this will be important to you in the future with finding out test results, communicating by private email, and scheduling acute appointments online when needed.

## 2018-05-17 NOTE — Progress Notes (Signed)
Subjective:    Patient ID: Dana Small, female    DOB: [birth year], 73 y.o.   MRN: [id number]  HPI  Here with acute visit, pt of Dr Sharlet Salina, c/o GI issues including rectal pains gas cramping like, mild, intermittent, most;ly only seems to occur in the AM, did not bother her today in the water aerobics, and would not say having abd pains.  Started x 5 days ago, only occurs in the AM.  Characterized by intermittent watery and later loose stools in the AM only, and no BRB abd melena; no fever, n/v, Denies worsening reflux, dysphagia.   Wondering about bad rice sat out too long, and 2 persons ill with flu like symptoms in the family. Nothing else seems to make better or worse.  Last colonoscopy in [place] about 6 yrs ago, had 1 polyp, thought they might have said come back in 5 or 7 yrs.  No unusual wt loss, and appetite ok.   Past Medical History:  Diagnosis Date  . Arthritis    Past Surgical History:  Procedure Laterality Date  . tooth implant      reports that she has never smoked. She has never used smokeless tobacco. She reports current alcohol use. She reports that she does not use drugs. family history includes Heart disease in an other family member; Hypertension in an other family member. No Known Allergies Current Outpatient Medications on File Prior to Visit  Medication Sig Dispense Refill  . Calcium-Magnesium-Vitamin D (CALCIUM 500 PO) Take by mouth.    . triamterene-hydrochlorothiazide (MAXZIDE-25) 37.5-25 MG tablet TAKE 1 TABLET BY MOUTH DAILY 90 tablet 3   No current facility-administered medications on file prior to visit.    Review of Systems  Constitutional: Negative for other unusual diaphoresis or sweats HENT: Negative for ear discharge or swelling Eyes: Negative for other worsening visual disturbances Respiratory: Negative for stridor or other swelling  Gastrointestinal: Negative for worsening distension or other blood Genitourinary: Negative for retention or other  urinary change Musculoskeletal: Negative for other MSK pain or swelling Skin: Negative for color change or other new lesions Neurological: Negative for worsening tremors and other numbness  Psychiatric/Behavioral: Negative for worsening agitation or other fatigue All other system neg per pt    Objective:   Physical Exam BP 124/72   Pulse 81   Temp 98.8 F (37.1 C) (Oral)   Ht 5' 4.5" (1.638 m)   Wt 174 lb (78.9 kg)   SpO2 92%   BMI 29.41 kg/m  VS noted,  Constitutional: Pt appears in NAD HENT: Head: NCAT.  Right Ear: External ear normal.  Left Ear: External ear normal.  Eyes: . Pupils are equal, round, and reactive to light. Conjunctivae and EOM are normal Nose: without d/c or deformity Neck: Neck supple. Gross normal ROM Cardiovascular: Normal rate and regular rhythm.   Pulmonary/Chest: Effort normal and breath sounds without rales or wheezing.  Abd:  Soft, NT, ND, + BS, no organomegaly Neurological: Pt is alert. At baseline orientation, motor grossly intact Skin: Skin is warm. No rashes, other new lesions, no LE edema Psychiatric: Pt behavior is normal without agitation  No other exam findings   Lab Results  Component Value Date   WBC 5.3 10/23/2017   HGB 14.2 10/23/2017   HCT 42.3 10/23/2017   PLT 188.0 10/23/2017   GLUCOSE 114 (H) 10/23/2017   CHOL 179 10/23/2017   TRIG 137.0 10/23/2017   HDL 52.80 10/23/2017   LDLCALC 99 10/23/2017   ALT 23  10/23/2017   AST 22 10/23/2017   NA 140 10/23/2017   K 4.7 10/23/2017   CL 103 10/23/2017   CREATININE 0.79 10/23/2017   BUN 18 10/23/2017   CO2 33 (H) 10/23/2017   TSH 2.87 10/23/2017       Assessment & Plan:

## 2018-05-17 NOTE — Assessment & Plan Note (Addendum)
Mild to mod, suggestive of proctitis, for labs as ordered and GI referral, may be due for colonoscopy as well (pt to get old records)  to f/u any worsening symptoms or concerns

## 2018-05-17 NOTE — Assessment & Plan Note (Signed)
Hx more suggestive of rectal d/c, but for GI panel

## 2018-05-20 ENCOUNTER — Encounter: Payer: Self-pay | Admitting: Gastroenterology

## 2018-05-20 ENCOUNTER — Other Ambulatory Visit: Payer: Medicare Other

## 2018-05-20 DIAGNOSIS — R197 Diarrhea, unspecified: Secondary | ICD-10-CM | POA: Diagnosis not present

## 2018-05-20 DIAGNOSIS — K6289 Other specified diseases of anus and rectum: Secondary | ICD-10-CM | POA: Diagnosis not present

## 2018-05-22 LAB — GASTROINTESTINAL PATHOGEN PANEL PCR
C. difficile Tox A/B, PCR: NOT DETECTED
CRYPTOSPORIDIUM, PCR: NOT DETECTED
Campylobacter, PCR: NOT DETECTED
E COLI (ETEC) LT/ST, PCR: NOT DETECTED
E COLI (STEC) STX1/STX2, PCR: NOT DETECTED
E coli 0157, PCR: NOT DETECTED
Giardia lamblia, PCR: NOT DETECTED
NOROVIRUS, PCR: NOT DETECTED
Rotavirus A, PCR: NOT DETECTED
SALMONELLA, PCR: NOT DETECTED
SHIGELLA, PCR: NOT DETECTED

## 2018-06-17 ENCOUNTER — Ambulatory Visit: Payer: Medicare Other | Admitting: Gastroenterology

## 2018-08-01 ENCOUNTER — Ambulatory Visit: Payer: Medicare Other | Admitting: Gastroenterology

## 2018-08-01 ENCOUNTER — Encounter

## 2018-09-27 ENCOUNTER — Encounter: Payer: Self-pay | Admitting: Internal Medicine

## 2018-10-25 ENCOUNTER — Other Ambulatory Visit: Payer: Self-pay | Admitting: Internal Medicine

## 2018-10-25 DIAGNOSIS — I10 Essential (primary) hypertension: Secondary | ICD-10-CM

## 2018-12-11 ENCOUNTER — Ambulatory Visit (INDEPENDENT_AMBULATORY_CARE_PROVIDER_SITE_OTHER): Payer: Medicare Other

## 2018-12-11 ENCOUNTER — Other Ambulatory Visit: Payer: Self-pay

## 2018-12-11 DIAGNOSIS — Z23 Encounter for immunization: Secondary | ICD-10-CM | POA: Diagnosis not present

## 2018-12-17 ENCOUNTER — Encounter: Payer: Self-pay | Admitting: Gastroenterology

## 2019-01-21 ENCOUNTER — Other Ambulatory Visit: Payer: Self-pay | Admitting: Internal Medicine

## 2019-01-21 DIAGNOSIS — I10 Essential (primary) hypertension: Secondary | ICD-10-CM

## 2019-01-23 ENCOUNTER — Ambulatory Visit (INDEPENDENT_AMBULATORY_CARE_PROVIDER_SITE_OTHER): Payer: Medicare Other | Admitting: Gastroenterology

## 2019-01-23 VITALS — BP 130/72 | HR 62 | Temp 98.5°F | Ht 64.5 in | Wt 182.5 lb

## 2019-01-23 DIAGNOSIS — Z86010 Personal history of colonic polyps: Secondary | ICD-10-CM | POA: Diagnosis not present

## 2019-01-23 DIAGNOSIS — K5904 Chronic idiopathic constipation: Secondary | ICD-10-CM

## 2019-01-23 MED ORDER — NA SULFATE-K SULFATE-MG SULF 17.5-3.13-1.6 GM/177ML PO SOLN: ORAL | 0 refills | Status: DC

## 2019-01-23 MED ORDER — METOCLOPRAMIDE HCL 5 MG PO TABS: 5.0000 mg | ORAL_TABLET | Freq: Four times a day (QID) | ORAL | 0 refills | Status: DC | PRN

## 2019-01-23 NOTE — Patient Instructions (Signed)
If you are age 73 or older, your body mass index should be between 23-30. Your Body mass index is 30.84 kg/m. If this is out of the aforementioned range listed, please consider follow up with your Primary Care Provider.  If you are age 69 or younger, your body mass index should be between 19-25. Your Body mass index is 30.84 kg/m. If this is out of the aformentioned range listed, please consider follow up with your Primary Care Provider.    You have been scheduled for a colonoscopy. Please follow written instructions given to you at your visit today.  Please pick up your prep supplies at the pharmacy within the next 1-3 days. If you use inhalers (even only as needed), please bring them with you on the day of your procedure.   Increase water intake to 8-10 cuos daily  We will send Reglan 5 mg to your pharmacy to take every 6 hours as needed with your prep   I appreciate the  opportunity to care for you  Thank You   Harl Bowie , MD

## 2019-01-23 NOTE — Progress Notes (Signed)
Dana Small    [id number]    1945-11-19  Primary Care Physician:Crawford, Real Cons, MD  Referring Physician: Biagio Borg, MD [address],  [place] [address]   Chief complaint:  History of  Colon polyps  HPI: 40 yr  very pleasant F here to establish GI care. She moved to [place] from [place], [place].   05/2013: removal of descending colon polyp 43mm and left side diverticulosis and internal hemorrhoids  Adenomatous polyp, recall recommended in 5 years. Tubular adenoma vs sessile serrated adenoma ??, full path report not available.   She had an episode earlier this year of increased rectal pressure and pain, felt the urge to pass gas but had a gush of clear fluid followed by stool which relieved the pressure, hasn't had any episodes since then.  She is having daily BM. Denies any nausea, vomiting, abdominal pain, melena or bright red blood per rectum  No family history of colon cancer or IBD  No decreased appetite or weight loss.     Outpatient Encounter Medications as of 01/23/2019  Medication Sig  . Calcium-Magnesium-Vitamin D (CALCIUM 500 PO) Take by mouth.  . triamterene-hydrochlorothiazide (MAXZIDE-25) 37.5-25 MG tablet TAKE 1 TABLET BY MOUTH DAILY   No facility-administered encounter medications on file as of 01/23/2019.     Allergies as of 01/23/2019  . (No Known Allergies)    Past Medical History:  Diagnosis Date  . Arthritis   . Hypertension     Past Surgical History:  Procedure Laterality Date  . COLONOSCOPY  06/11/2013   [facility]  . tooth implant      Family History  Problem Relation Age of Onset  . Heart disease Other        Parents  . Hypertension Other        Parents  . Diverticulitis Father   . Breast cancer Neg Hx   . Colon cancer Neg Hx   . Esophageal cancer Neg Hx   . Cancer Neg Hx     Social History   Socioeconomic History  . Marital status: Married    Spouse name: Not on file  .  Number of children: Not on file  . Years of education: Not on file  . Highest education level: Not on file  Occupational History  . Not on file  Social Needs  . Financial resource strain: Not on file  . Food insecurity    Worry: Not on file    Inability: Not on file  . Transportation needs    Medical: Not on file    Non-medical: Not on file  Tobacco Use  . Smoking status: Never Smoker  . Smokeless tobacco: Never Used  Substance and Sexual Activity  . Alcohol use: Yes  . Drug use: Never  . Sexual activity: Not on file  Lifestyle  . Physical activity    Days per week: Not on file    Minutes per session: Not on file  . Stress: Not on file  Relationships  . Social Herbalist on phone: Not on file    Gets together: Not on file    Attends religious service: Not on file    Active member of club or organization: Not on file    Attends meetings of clubs or organizations: Not on file    Relationship status: Not on file  . Intimate partner violence    Fear of current  or ex partner: Not on file    Emotionally abused: Not on file    Physically abused: Not on file    Forced sexual activity: Not on file  Other Topics Concern  . Not on file  Social History Narrative  . Not on file      Review of systems: Review of Systems  Constitutional: Negative for fever and chills.  HENT: Negative.   Eyes: Negative for blurred vision.  Respiratory: Negative for cough, shortness of breath and wheezing.   Cardiovascular: Negative for chest pain and palpitations.  Gastrointestinal: as per HPI Genitourinary: Negative for dysuria, urgency, frequency and hematuria.  Musculoskeletal: Negative for myalgias, back pain and positive for joint pain.  Skin: Negative for itching and rash.  Neurological: Negative for dizziness, tremors, focal weakness, seizures and loss of consciousness.  Endo/Heme/Allergies: Negative Psychiatric/Behavioral: Negative for depression, suicidal ideas and  hallucinations.  All other systems reviewed and are negative.   Physical Exam: Vitals:   01/23/19 1009  BP: 130/72  Pulse: 62  Temp: 98.5 F (36.9 C)   Body mass index is 30.84 kg/m. Gen:      No acute distress HEENT:  EOMI, sclera anicteric Neck:     No masses; no thyromegaly Lungs:    Clear to auscultation bilaterally; normal respiratory effort CV:         Regular rate and rhythm; no murmurs Abd:      + bowel sounds; soft, non-tender; no palpable masses, no distension Ext:    No edema; adequate peripheral perfusion Skin:      Warm and dry; no rash Neuro: alert and oriented x 3 Psych: normal mood and affect  Data Reviewed:  Reviewed labs, radiology imaging, old records and pertinent past GI work up   Assessment and Plan/Recommendations:  71 yr F with h/o sigmoid diverticulosis and adenomatous colon polyp is here to discuss surveillance colonoscopy.   She is due to surveillance colonoscopy, will schedule the procedure The risks and benefits as well as alternatives of endoscopic procedure(s) have been discussed and reviewed. All questions answered. The patient agrees to proceed. She had nausea during bowel prep, will send Rx for Reglan 5mg  q6h prn with bowel prep to prevent nausea  Unclear etiology for rectal pressure, possible stool impaction, has improved since and no further episodes. Increase dietary fiber intake and fluid intake to 8-10 cups water daily  Return as needed  K. Denzil Magnuson , MD    CC: Biagio Borg, MD

## 2019-01-24 ENCOUNTER — Encounter: Payer: Self-pay | Admitting: Gastroenterology

## 2019-02-13 ENCOUNTER — Other Ambulatory Visit: Payer: Self-pay

## 2019-02-13 DIAGNOSIS — Z1159 Encounter for screening for other viral diseases: Secondary | ICD-10-CM

## 2019-02-13 DIAGNOSIS — Z86010 Personal history of colonic polyps: Secondary | ICD-10-CM

## 2019-02-13 NOTE — Progress Notes (Signed)
, °

## 2019-02-17 ENCOUNTER — Encounter: Payer: Self-pay | Admitting: Gastroenterology

## 2019-02-17 ENCOUNTER — Other Ambulatory Visit: Payer: Self-pay

## 2019-02-17 ENCOUNTER — Ambulatory Visit (AMBULATORY_SURGERY_CENTER): Payer: Medicare Other | Admitting: Gastroenterology

## 2019-02-17 VITALS — BP 167/72 | HR 55 | Temp 97.1°F | Resp 14 | Ht 64.5 in | Wt 182.0 lb

## 2019-02-17 DIAGNOSIS — Z86010 Personal history of colonic polyps: Secondary | ICD-10-CM | POA: Diagnosis not present

## 2019-02-17 MED ORDER — SODIUM CHLORIDE 0.9 % IV SOLN: 500.0000 mL | Freq: Once | INTRAVENOUS | Status: DC

## 2019-02-17 NOTE — Op Note (Signed)
[facility] Patient Name: Dana Small Procedure Date: 02/17/2019 2:02 PM MRN: [id number] Endoscopist: Mauri Pole , MD Age: 73 Referring MD:  Date of Birth: [birth year] Gender: Female Account #: [id number] Procedure:                Colonoscopy Indications:              High risk colon cancer surveillance: Personal                            history of colonic polyps, Last colonoscopy: 2015.                            History of diverticulitis. Medicines:                Monitored Anesthesia Care Procedure:                Pre-Anesthesia Assessment:                           - Prior to the procedure, a History and Physical                            was performed, and patient medications and                            allergies were reviewed. The patient's tolerance of                            previous anesthesia was also reviewed. The risks                            and benefits of the procedure and the sedation                            options and risks were discussed with the patient.                            All questions were answered, and informed consent                            was obtained. Prior Anticoagulants: The patient has                            taken no previous anticoagulant or antiplatelet                            agents. ASA Grade Assessment: II - A patient with                            mild systemic disease. After reviewing the risks                            and benefits, the patient was deemed in  satisfactory condition to undergo the procedure.                           After obtaining informed consent, the colonoscope                            was passed under direct vision. Throughout the                            procedure, the patient's blood pressure, pulse, and                            oxygen saturations were monitored continuously. The                            Colonoscope was introduced  through the anus and                            advanced to the the cecum, identified by                            appendiceal orifice and ileocecal valve. The                            colonoscopy was performed without difficulty. The                            patient tolerated the procedure well. The quality                            of the bowel preparation was good. The ileocecal                            valve, appendiceal orifice, and rectum were                            photographed. Scope In: 2:14:39 PM Scope Out: 2:26:58 PM Scope Withdrawal Time: 0 hours 7 minutes 28 seconds  Total Procedure Duration: 0 hours 12 minutes 19 seconds  Findings:                 The perianal and digital rectal examinations were                            normal.                           Scattered small and large-mouthed diverticula were                            found in the sigmoid colon and descending colon.                           Non-bleeding internal hemorrhoids were found during  retroflexion. The hemorrhoids were small.                           The exam was otherwise without abnormality. Complications:            No immediate complications. Estimated Blood Loss:     Estimated blood loss was minimal. Impression:               - Diverticulosis in the sigmoid colon and in the                            descending colon.                           - Non-bleeding internal hemorrhoids.                           - The examination was otherwise normal.                           - No specimens collected. Recommendation:           - Patient has a contact number available for                            emergencies. The signs and symptoms of potential                            delayed complications were discussed with the                            patient. Return to normal activities tomorrow.                            Written discharge instructions were provided  to the                            patient.                           - Resume previous diet.                           - Continue present medications.                           - Repeat colonoscopy in 5 years for surveillance.                           - Use Benefiber Chewable one tablet PO TID. Mauri Pole, MD 02/17/2019 2:31:28 PM This report has been signed electronically.

## 2019-02-17 NOTE — Patient Instructions (Signed)
YOU HAD AN ENDOSCOPIC PROCEDURE TODAY AT THE [facility]:   Refer to the procedure report that was given to you for any specific questions about what was found during the examination.  If the procedure report does not answer your questions, please call your gastroenterologist to clarify.  If you requested that your care partner not be given the details of your procedure findings, then the procedure report has been included in a sealed envelope for you to review at your convenience later.  YOU SHOULD EXPECT: Some feelings of bloating in the abdomen. Passage of more gas than usual.  Walking can help get rid of the air that was put into your GI tract during the procedure and reduce the bloating. If you had a lower endoscopy (such as a colonoscopy or flexible sigmoidoscopy) you may notice spotting of blood in your stool or on the toilet paper. If you underwent a bowel prep for your procedure, you may not have a normal bowel movement for a few days.  Please Note:  You might notice some irritation and congestion in your nose or some drainage.  This is from the oxygen used during your procedure.  There is no need for concern and it should clear up in a day or so.  SYMPTOMS TO REPORT IMMEDIATELY:   Following lower endoscopy (colonoscopy or flexible sigmoidoscopy):  Excessive amounts of blood in the stool  Significant tenderness or worsening of abdominal pains  Swelling of the abdomen that is new, acute  Fever of 100F or higher  For urgent or emergent issues, a gastroenterologist can be reached at any hour by calling [phone].   DIET:  We do recommend a small meal at first, but then you may proceed to your regular diet.  Drink plenty of fluids but you should avoid alcoholic beverages for 24 hours.  ACTIVITY:  You should plan to take it easy for the rest of today and you should NOT DRIVE or use heavy machinery until tomorrow (because of the sedation medicines used during the test).     FOLLOW UP: Our staff will call the number listed on your records 48-72 hours following your procedure to check on you and address any questions or concerns that you may have regarding the information given to you following your procedure. If we do not reach you, we will leave a message.  We will attempt to reach you two times.  During this call, we will ask if you have developed any symptoms of COVID 19. If you develop any symptoms (ie: fever, flu-like symptoms, shortness of breath, cough etc.) before then, please call [phone].  If you test positive for Covid 19 in the 2 weeks post procedure, please call and report this information to us.    If any biopsies were taken you will be contacted by phone or by letter within the next 1-3 weeks.  Please call us at [phone] if you have not heard about the biopsies in 3 weeks.    SIGNATURES/CONFIDENTIALITY: You and/or your care partner have signed paperwork which will be entered into your electronic medical record.  These signatures attest to the fact that that the information above on your After Visit Summary has been reviewed and is understood.  Full responsibility of the confidentiality of this discharge information lies with you and/or your care-partner. 

## 2019-02-17 NOTE — Progress Notes (Signed)
Pt. Stable and tolerated well. To recovery. Pt. Awake.

## 2019-02-17 NOTE — Progress Notes (Signed)
VS- Dana Small Temperature- [name]  BP rechecked at 1330- 167/79

## 2019-02-19 ENCOUNTER — Telehealth: Payer: Self-pay

## 2019-02-19 NOTE — Telephone Encounter (Signed)
  Follow up Call-  Call back number 02/17/2019  Post procedure Call Back phone  # [phone]  Permission to leave phone message Yes  Some recent data might be hidden     Patient questions:  Do you have a fever, pain , or abdominal swelling? No. Pain Score  0 *  Have you tolerated food without any problems? Yes.    Have you been able to return to your normal activities? Yes.    Do you have any questions about your discharge instructions: Diet   No. Medications  No. Follow up visit  No.  Do you have questions or concerns about your Care? No.  Actions: * If pain score is 4 or above: No action needed, pain <4.  1. Have you developed a fever since your procedure? no  2.   Have you had an respiratory symptoms (SOB or cough) since your procedure? no  3.   Have you tested positive for COVID 19 since your procedure no  4.   Have you had any family members/close contacts diagnosed with the COVID 19 since your procedure?  no   If yes to any of these questions please route to Joylene John, RN and Alphonsa Gin, Therapist, sports.

## 2019-02-24 ENCOUNTER — Other Ambulatory Visit: Payer: Self-pay | Admitting: Internal Medicine

## 2019-02-24 DIAGNOSIS — Z1231 Encounter for screening mammogram for malignant neoplasm of breast: Secondary | ICD-10-CM

## 2019-03-11 ENCOUNTER — Other Ambulatory Visit: Payer: Self-pay

## 2019-04-16 ENCOUNTER — Other Ambulatory Visit: Payer: Self-pay

## 2019-04-16 ENCOUNTER — Ambulatory Visit
Admission: RE | Admit: 2019-04-16 | Discharge: 2019-04-16 | Disposition: A | Discharge status: Home / Self Care | Payer: Medicare Other | Source: Ambulatory Visit | Attending: Internal Medicine | Admitting: Internal Medicine

## 2019-04-16 DIAGNOSIS — Z1231 Encounter for screening mammogram for malignant neoplasm of breast: Secondary | ICD-10-CM

## 2019-04-16 IMAGING — MG DIGITAL SCREENING BILAT W/ TOMO W/ CAD
8 series · 9 of 24 positions shown · non-contrast
Comparison: Previous exam(s).

CLINICAL DATA: Screening.

EXAM:
DIGITAL SCREENING BILATERAL MAMMOGRAM WITH TOMO AND CAD

[L CC synth-2D]
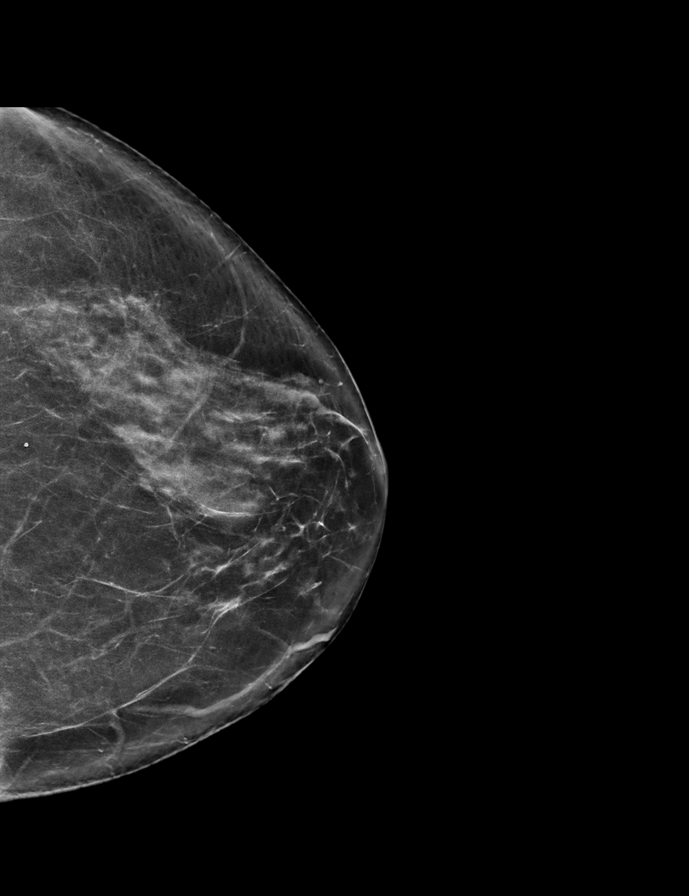

[L MLO synth-2D]
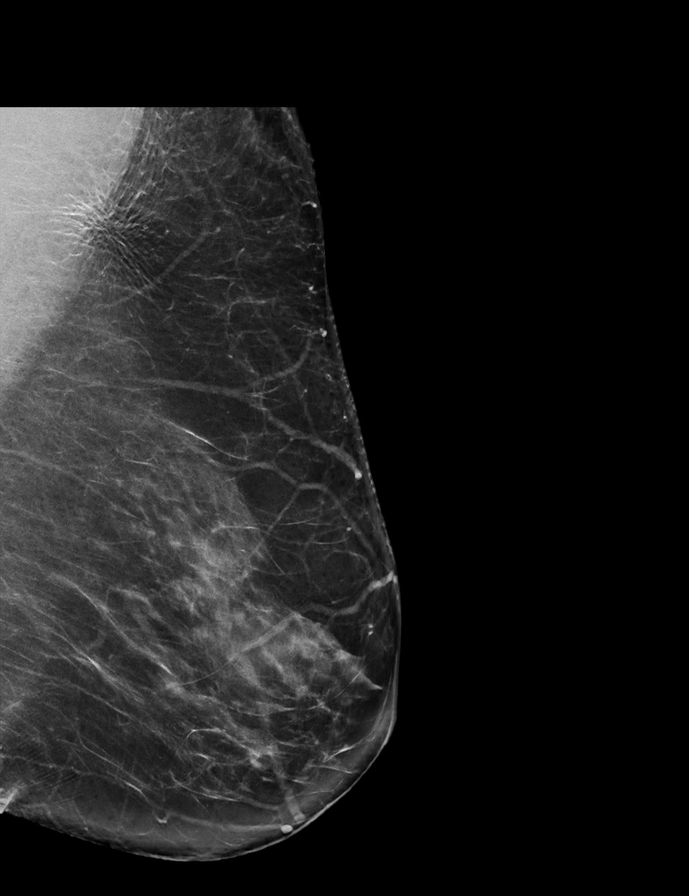

[R MLO synth-2D]
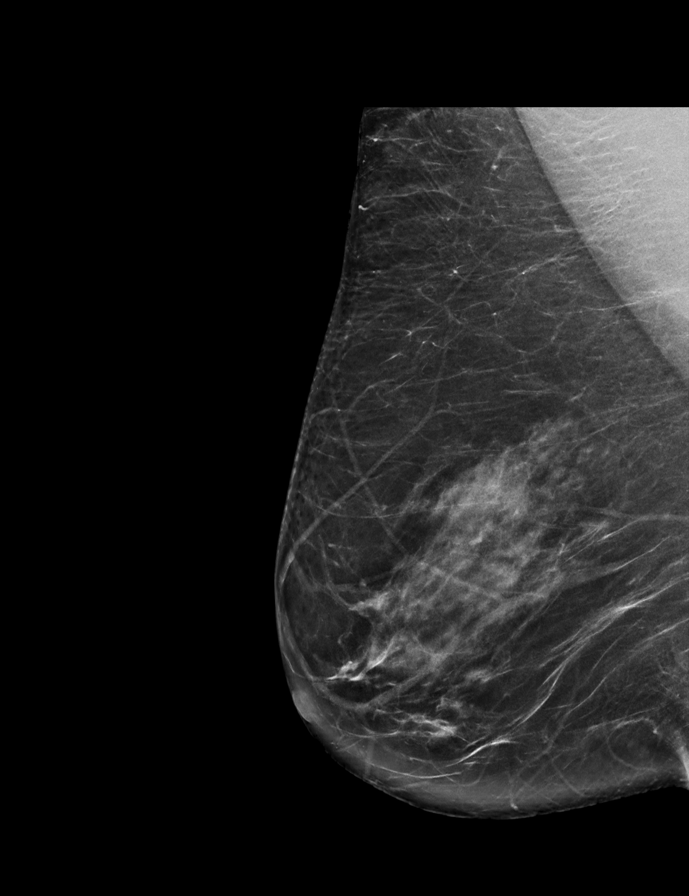

[R CC synth-2D]
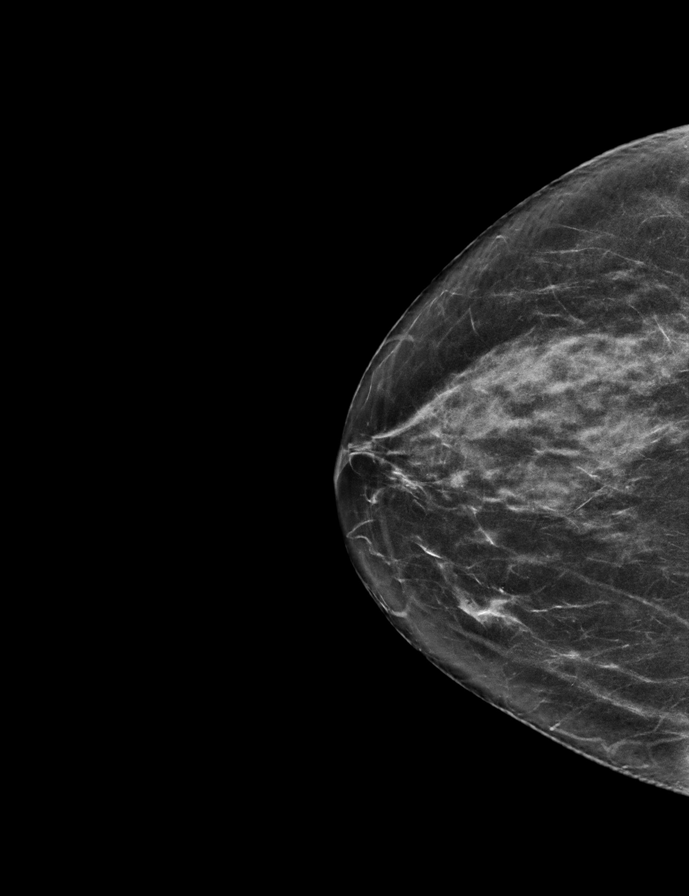

[L CC tomo · 2 of 69 frames shown]
[frame 23/69]
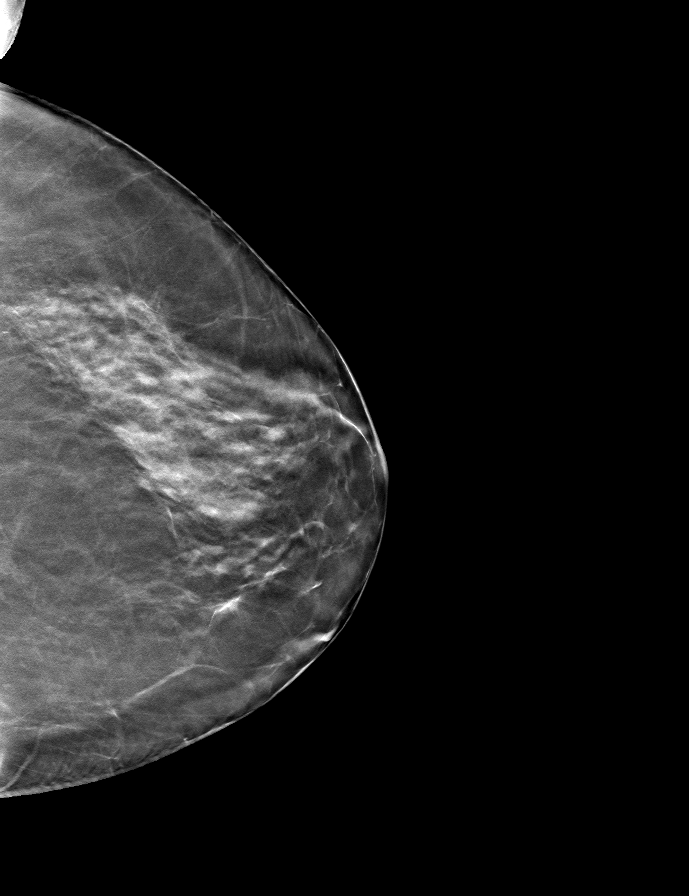
[frame 35/69]
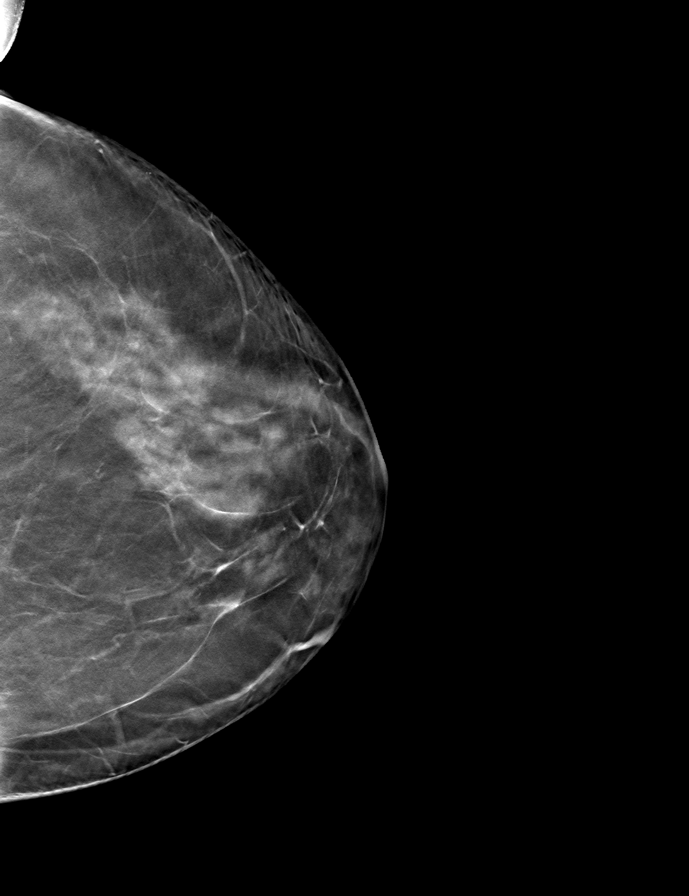

[L MLO tomo · tomo slice 38/75.0]
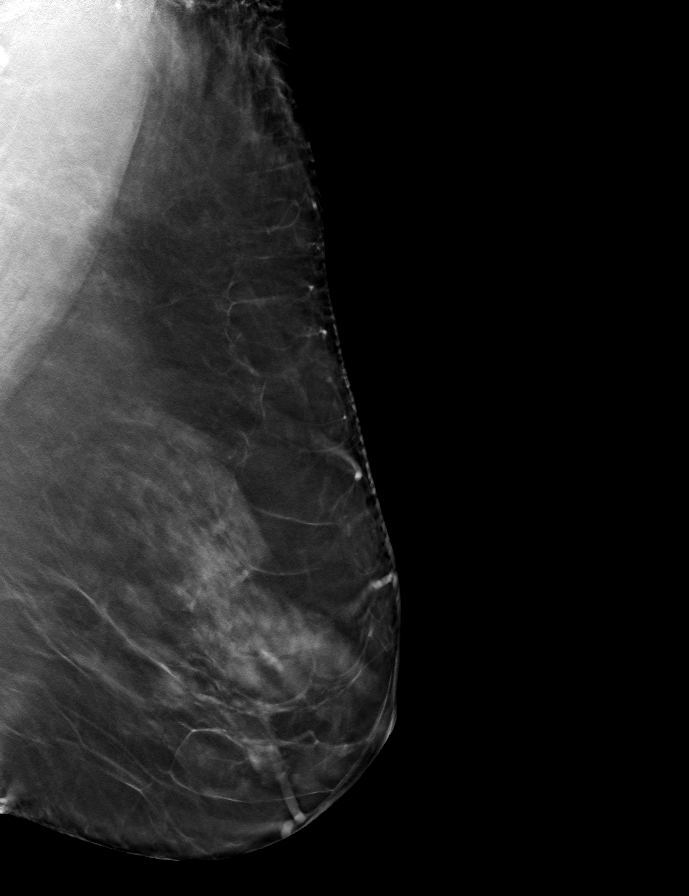

[R CC tomo · tomo slice 31/60.0]
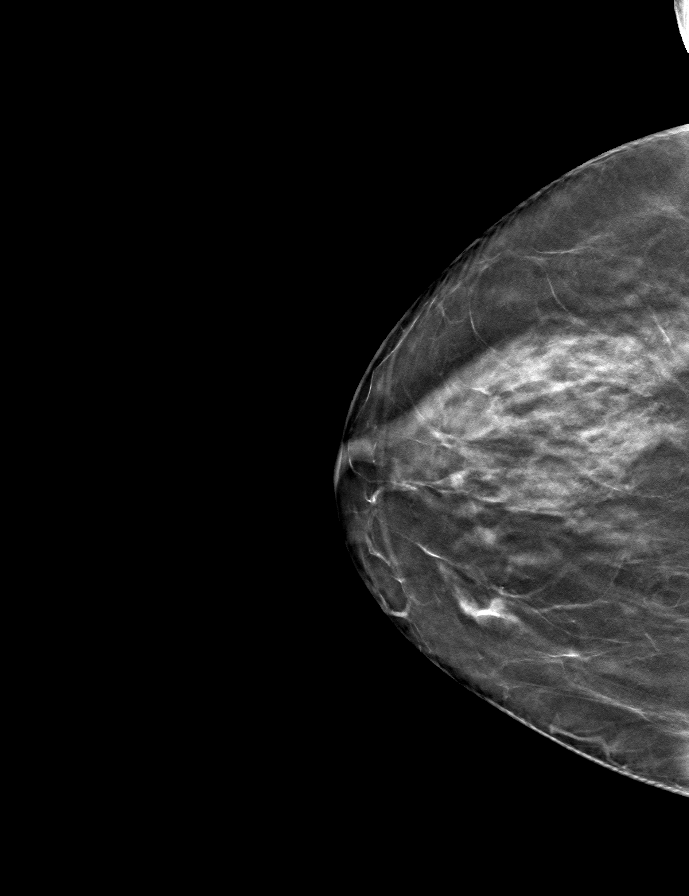

[R MLO tomo · tomo slice 36/71.0]
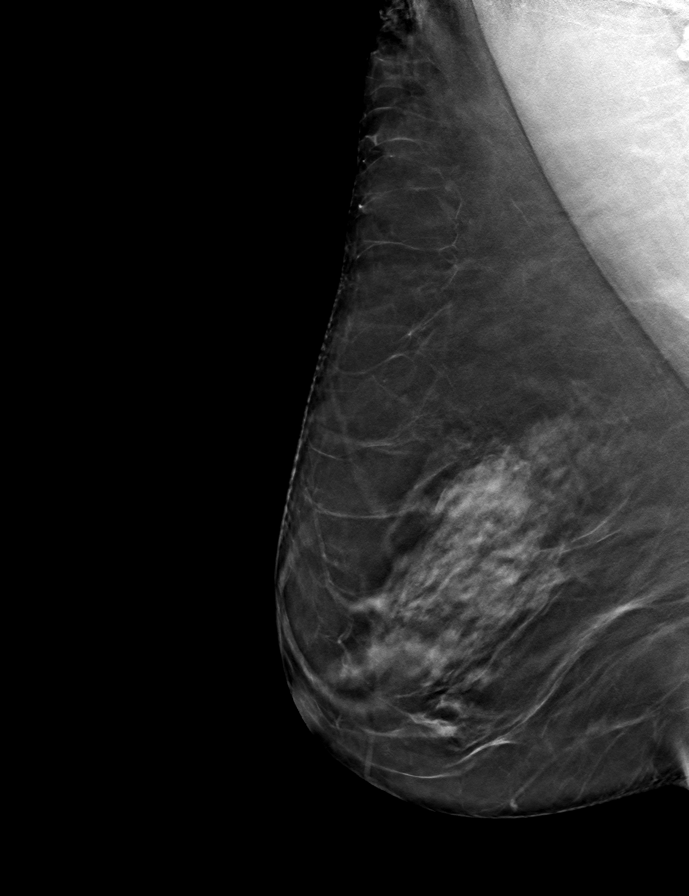

[9 of 24 positions shown; findings below may reference images not displayed]

ACR Breast Density Category c: The breast tissue is heterogeneously
dense, which may obscure small masses.
FINDINGS: There are no findings suspicious for malignancy. Images were
processed with CAD.
IMPRESSION: No mammographic evidence of malignancy. A result letter of this
screening mammogram will be mailed directly to the patient.

RECOMMENDATION:
Screening mammogram in one year. (Code:[5V])

BI-RADS CATEGORY  1: Negative.

## 2019-04-22 ENCOUNTER — Other Ambulatory Visit: Payer: Self-pay | Admitting: Internal Medicine

## 2019-04-22 DIAGNOSIS — I10 Essential (primary) hypertension: Secondary | ICD-10-CM

## 2019-04-23 MED ORDER — TRIAMTERENE-HCTZ 37.5-25 MG PO TABS: 1.0000 | ORAL_TABLET | Freq: Every day | ORAL | 0 refills | Status: DC

## 2019-04-24 ENCOUNTER — Ambulatory Visit (INDEPENDENT_AMBULATORY_CARE_PROVIDER_SITE_OTHER): Payer: Medicare Other | Admitting: Internal Medicine

## 2019-04-24 ENCOUNTER — Encounter: Payer: Self-pay | Admitting: Internal Medicine

## 2019-04-24 ENCOUNTER — Other Ambulatory Visit: Payer: Self-pay

## 2019-04-24 VITALS — BP 136/82 | HR 67 | Temp 97.8°F | Ht 64.5 in | Wt 183.0 lb

## 2019-04-24 DIAGNOSIS — E6609 Other obesity due to excess calories: Secondary | ICD-10-CM | POA: Diagnosis not present

## 2019-04-24 DIAGNOSIS — M19049 Primary osteoarthritis, unspecified hand: Secondary | ICD-10-CM

## 2019-04-24 DIAGNOSIS — R7301 Impaired fasting glucose: Secondary | ICD-10-CM

## 2019-04-24 DIAGNOSIS — E2839 Other primary ovarian failure: Secondary | ICD-10-CM

## 2019-04-24 DIAGNOSIS — I10 Essential (primary) hypertension: Secondary | ICD-10-CM | POA: Diagnosis not present

## 2019-04-24 DIAGNOSIS — Z6830 Body mass index (BMI) 30.0-30.9, adult: Secondary | ICD-10-CM

## 2019-04-24 DIAGNOSIS — Z0000 Encounter for general adult medical examination without abnormal findings: Secondary | ICD-10-CM

## 2019-04-24 LAB — LIPID PANEL
Cholesterol: 183 mg/dL (ref 0–200)
HDL: 46.7 mg/dL (ref >39.00)
LDL Cholesterol: 109 mg/dL — ABNORMAL HIGH (ref 0–99)
NonHDL: 135.8
Total CHOL/HDL Ratio: 4
Triglycerides: 133 mg/dL (ref 0.0–149.0)
VLDL: 26.6 mg/dL (ref 0.0–40.0)

## 2019-04-24 LAB — COMPREHENSIVE METABOLIC PANEL
ALT: 24 U/L (ref 0–35)
AST: 25 U/L (ref 0–37)
Albumin: 4.1 g/dL (ref 3.5–5.2)
Alkaline Phosphatase: 66 U/L (ref 39–117)
BUN: 19 mg/dL (ref 6–23)
CO2: 31 mEq/L (ref 19–32)
Calcium: 9.5 mg/dL (ref 8.4–10.5)
Chloride: 101 mEq/L (ref 96–112)
Creatinine, Ser: 0.68 mg/dL (ref 0.40–1.20)
GFR: 84.76 mL/min (ref >60.00)
Glucose, Bld: 100 mg/dL — ABNORMAL HIGH (ref 70–99)
Potassium: 3.8 mEq/L (ref 3.5–5.1)
Sodium: 137 mEq/L (ref 135–145)
Total Bilirubin: 0.3 mg/dL (ref 0.2–1.2)
Total Protein: 7.3 g/dL (ref 6.0–8.3)

## 2019-04-24 LAB — CBC
HCT: 41.4 % (ref 36.0–46.0)
Hemoglobin: 13.7 g/dL (ref 12.0–15.0)
MCHC: 33 g/dL (ref 30.0–36.0)
MCV: 94.4 fl (ref 78.0–100.0)
Platelets: 248 10*3/uL (ref 150.0–400.0)
RBC: 4.39 Mil/uL (ref 3.87–5.11)
RDW: 13.1 % (ref 11.5–15.5)
WBC: 6.8 10*3/uL (ref 4.0–10.5)

## 2019-04-24 LAB — HEMOGLOBIN A1C: Hgb A1c MFr Bld: 6.1 % (ref 4.6–6.5)

## 2019-04-24 LAB — TSH: TSH: 2.24 u[IU]/mL (ref 0.35–4.50)

## 2019-04-24 MED ORDER — TRIAMTERENE-HCTZ 37.5-25 MG PO TABS: 1.0000 | ORAL_TABLET | Freq: Every day | ORAL | 3 refills | Status: DC

## 2019-04-24 NOTE — Progress Notes (Signed)
Subjective:   Patient ID: Dana Small, female    DOB: 1945/07/19, 74 y.o.   MRN: [id number]  HPI Here for medicare wellness, no new complaints. Please see A/P for status and treatment of chronic medical problems.   HPI #2: Here for follow up blood pressure (taking hctz without side effects, denies chest pains or headaches, denies high BP at home) and obesity (weight up with pandemic and not moving as much, diet is also a little less good, thinking about weight watchers or nutrisystem or noom) and arthritis (taking otc ibuprofen and this seems to do okay, winter is typically worse for symptoms, denies falls or injuries recently).  Diet: average Physical activity: sedentary Depression/mood screen: negative Hearing: intact to whispered voice left, right with moderate loss stable Visual acuity: grossly normal with lens, performs annual eye exam  ADLs: capable Fall risk: none Home safety: good Cognitive evaluation: intact to orientation, naming, recall and repetition EOL planning: adv directives discussed    Office Visit from 04/24/2019 in [place] at [facility] Total Score  0      I have personally reviewed and have noted 1. The patient's medical and social history - reviewed today no changes 2. Their use of alcohol, tobacco or illicit drugs 3. Their current medications and supplements 4. The patient's functional ability including ADL's, fall risks, home safety risks and hearing or visual impairment. 5. Diet and physical activities 6. Evidence for depression or mood disorders 7. Care team reviewed and updated  Patient Care Team: Hoyt Koch, MD as PCP - General (Internal Medicine) Past Medical History:  Diagnosis Date  . Arthritis   . Hypertension    Past Surgical History:  Procedure Laterality Date  . COLONOSCOPY  06/11/2013   [facility]  . TOE SURGERY    . tooth implant     Family History  Problem Relation Age of Onset  .  Heart disease Other        Parents  . Hypertension Other        Parents  . Diverticulitis Father   . Breast cancer Neg Hx   . Colon cancer Neg Hx   . Esophageal cancer Neg Hx   . Cancer Neg Hx   . Stomach cancer Neg Hx   . Rectal cancer Neg Hx     Review of Systems  Constitutional: Negative.   HENT: Negative.   Eyes: Negative.   Respiratory: Negative for cough, chest tightness and shortness of breath.   Cardiovascular: Negative for chest pain, palpitations and leg swelling.  Gastrointestinal: Negative for abdominal distention, abdominal pain, constipation, diarrhea, nausea and vomiting.  Musculoskeletal: Negative.   Skin: Negative.   Neurological: Negative.   Psychiatric/Behavioral: Negative.     Objective:  Physical Exam Constitutional:      Appearance: She is well-developed.  HENT:     Head: Normocephalic and atraumatic.  Cardiovascular:     Rate and Rhythm: Normal rate and regular rhythm.  Pulmonary:     Effort: Pulmonary effort is normal. No respiratory distress.     Breath sounds: Normal breath sounds. No wheezing or rales.  Abdominal:     General: Bowel sounds are normal. There is no distension.     Palpations: Abdomen is soft.     Tenderness: There is no abdominal tenderness. There is no rebound.  Musculoskeletal:     Cervical back: Normal range of motion.  Skin:    General: Skin is warm and dry.  Neurological:  Mental Status: She is alert and oriented to person, place, and time.     Coordination: Coordination normal.     Vitals:   04/24/19 1345  BP: 136/82  Pulse: 67  Temp: 97.8 F (36.6 C)  TempSrc: Oral  SpO2: 98%  Weight: 183 lb (83 kg)  Height: 5' 4.5" (1.638 m)   This visit occurred during the SARS-CoV-2 public health emergency.  Safety protocols were in place, including screening questions prior to the visit, additional usage of staff PPE, and extensive cleaning of exam room while observing appropriate contact time as indicated for  disinfecting solutions.   Assessment & Plan:

## 2019-04-24 NOTE — Patient Instructions (Signed)
Health Maintenance, Female Adopting a healthy lifestyle and getting preventive care are important in promoting health and wellness. Ask your health care provider about:  The right schedule for you to have regular tests and exams.  Things you can do on your own to prevent diseases and keep yourself healthy. What should I know about diet, weight, and exercise? Eat a healthy diet   Eat a diet that includes plenty of vegetables, fruits, low-fat dairy products, and lean protein.  Do not eat a lot of foods that are high in solid fats, added sugars, or sodium. Maintain a healthy weight Body mass index (BMI) is used to identify weight problems. It estimates body fat based on height and weight. Your health care provider can help determine your BMI and help you achieve or maintain a healthy weight. Get regular exercise Get regular exercise. This is one of the most important things you can do for your health. Most adults should:  Exercise for at least 150 minutes each week. The exercise should increase your heart rate and make you sweat (moderate-intensity exercise).  Do strengthening exercises at least twice a week. This is in addition to the moderate-intensity exercise.  Spend less time sitting. Even light physical activity can be beneficial. Watch cholesterol and blood lipids Have your blood tested for lipids and cholesterol at 74 years of age, then have this test every 5 years. Have your cholesterol levels checked more often if:  Your lipid or cholesterol levels are high.  You are older than 74 years of age.  You are at high risk for heart disease. What should I know about cancer screening? Depending on your health history and family history, you may need to have cancer screening at various ages. This may include screening for:  Breast cancer.  Cervical cancer.  Colorectal cancer.  Skin cancer.  Lung cancer. What should I know about heart disease, diabetes, and high blood  pressure? Blood pressure and heart disease  High blood pressure causes heart disease and increases the risk of stroke. This is more likely to develop in people who have high blood pressure readings, are of African descent, or are overweight.  Have your blood pressure checked: ? Every 3-5 years if you are 18-39 years of age. ? Every year if you are 40 years old or older. Diabetes Have regular diabetes screenings. This checks your fasting blood sugar level. Have the screening done:  Once every three years after age 40 if you are at a normal weight and have a low risk for diabetes.  More often and at a younger age if you are overweight or have a high risk for diabetes. What should I know about preventing infection? Hepatitis B If you have a higher risk for hepatitis B, you should be screened for this virus. Talk with your health care provider to find out if you are at risk for hepatitis B infection. Hepatitis C Testing is recommended for:  Everyone born from 1945 through 1965.  Anyone with known risk factors for hepatitis C. Sexually transmitted infections (STIs)  Get screened for STIs, including gonorrhea and chlamydia, if: ? You are sexually active and are younger than 74 years of age. ? You are older than 74 years of age and your health care provider tells you that you are at risk for this type of infection. ? Your sexual activity has changed since you were last screened, and you are at increased risk for chlamydia or gonorrhea. Ask your health care provider if   you are at risk.  Ask your health care provider about whether you are at high risk for HIV. Your health care provider may recommend a prescription medicine to help prevent HIV infection. If you choose to take medicine to prevent HIV, you should first get tested for HIV. You should then be tested every 3 months for as long as you are taking the medicine. Pregnancy  If you are about to stop having your period (premenopausal) and  you may become pregnant, seek counseling before you get pregnant.  Take 400 to 800 micrograms (mcg) of folic acid every day if you become pregnant.  Ask for birth control (contraception) if you want to prevent pregnancy. Osteoporosis and menopause Osteoporosis is a disease in which the bones lose minerals and strength with aging. This can result in bone fractures. If you are 65 years old or older, or if you are at risk for osteoporosis and fractures, ask your health care provider if you should:  Be screened for bone loss.  Take a calcium or vitamin D supplement to lower your risk of fractures.  Be given hormone replacement therapy (HRT) to treat symptoms of menopause. Follow these instructions at home: Lifestyle  Do not use any products that contain nicotine or tobacco, such as cigarettes, e-cigarettes, and chewing tobacco. If you need help quitting, ask your health care provider.  Do not use street drugs.  Do not share needles.  Ask your health care provider for help if you need support or information about quitting drugs. Alcohol use  Do not drink alcohol if: ? Your health care provider tells you not to drink. ? You are pregnant, may be pregnant, or are planning to become pregnant.  If you drink alcohol: ? Limit how much you use to 0-1 drink a day. ? Limit intake if you are breastfeeding.  Be aware of how much alcohol is in your drink. In the [place] one drink equals one 12 oz bottle of beer (355 mL), one 5 oz glass of wine (148 mL), or one 1 oz glass of hard liquor (44 mL). General instructions  Schedule regular health, dental, and eye exams.  Stay current with your vaccines.  Tell your health care provider if: ? You often feel depressed. ? You have ever been abused or do not feel safe at home. Summary  Adopting a healthy lifestyle and getting preventive care are important in promoting health and wellness.  Follow your health care provider's instructions about healthy  diet, exercising, and getting tested or screened for diseases.  Follow your health care provider's instructions on monitoring your cholesterol and blood pressure. This information is not intended to replace advice given to you by your health care provider. Make sure you discuss any questions you have with your health care provider. Document Revised: 03/27/2018 Document Reviewed: 03/27/2018 Elsevier Patient Education  2020 [employer]vier Inc.  

## 2019-04-25 DIAGNOSIS — E669 Obesity, unspecified: Secondary | ICD-10-CM | POA: Insufficient documentation

## 2019-04-25 NOTE — Assessment & Plan Note (Signed)
Flu shot up to date. Pneumonia declines 13 today with pandemic. Shingrix counseled. Tetanus due 2027. Colonoscopy due 2025. Mammogram due 2022, pap smear aged out and dexa ordered. Counseled about sun safety and mole surveillance. Counseled about the dangers of distracted driving. Given 10 year screening recommendations.

## 2019-04-25 NOTE — Assessment & Plan Note (Signed)
Taking otc, checking CMP to ensure no renal insufficiency from ibuprofen.

## 2019-04-25 NOTE — Assessment & Plan Note (Signed)
BP good on hctz 25 mg daily. Checking CMP and adjust as needed.

## 2019-04-25 NOTE — Assessment & Plan Note (Signed)
Weight up over the last year. Counseling given on actions she can take to reduce weight and options to help keep her on track.

## 2019-05-14 ENCOUNTER — Ambulatory Visit: Payer: Medicare Other

## 2019-05-23 ENCOUNTER — Ambulatory Visit: Payer: Medicare Other

## 2019-06-04 ENCOUNTER — Ambulatory Visit: Payer: Medicare Other

## 2019-07-26 ENCOUNTER — Other Ambulatory Visit: Payer: Self-pay | Admitting: Internal Medicine

## 2019-07-26 DIAGNOSIS — I10 Essential (primary) hypertension: Secondary | ICD-10-CM

## 2019-07-29 ENCOUNTER — Ambulatory Visit (INDEPENDENT_AMBULATORY_CARE_PROVIDER_SITE_OTHER)
Admission: RE | Admit: 2019-07-29 | Discharge: 2019-07-29 | Disposition: A | Discharge status: Home / Self Care | Payer: Medicare Other | Source: Ambulatory Visit | Attending: Internal Medicine | Admitting: Internal Medicine

## 2019-07-29 ENCOUNTER — Other Ambulatory Visit: Payer: Self-pay

## 2019-07-29 DIAGNOSIS — E2839 Other primary ovarian failure: Secondary | ICD-10-CM | POA: Diagnosis not present

## 2019-08-13 DIAGNOSIS — Z20822 Contact with and (suspected) exposure to covid-19: Secondary | ICD-10-CM | POA: Diagnosis not present

## 2019-09-16 DIAGNOSIS — L821 Other seborrheic keratosis: Secondary | ICD-10-CM | POA: Diagnosis not present

## 2019-09-16 DIAGNOSIS — L570 Actinic keratosis: Secondary | ICD-10-CM | POA: Diagnosis not present

## 2019-10-14 ENCOUNTER — Encounter: Payer: Self-pay | Admitting: Internal Medicine

## 2019-10-14 ENCOUNTER — Ambulatory Visit (INDEPENDENT_AMBULATORY_CARE_PROVIDER_SITE_OTHER): Payer: Medicare Other | Admitting: Internal Medicine

## 2019-10-14 ENCOUNTER — Ambulatory Visit (INDEPENDENT_AMBULATORY_CARE_PROVIDER_SITE_OTHER): Payer: Medicare Other

## 2019-10-14 ENCOUNTER — Other Ambulatory Visit: Payer: Self-pay

## 2019-10-14 VITALS — BP 144/92 | HR 64 | Temp 98.3°F | Ht 64.5 in | Wt 171.0 lb

## 2019-10-14 DIAGNOSIS — G8929 Other chronic pain: Secondary | ICD-10-CM

## 2019-10-14 DIAGNOSIS — E6609 Other obesity due to excess calories: Secondary | ICD-10-CM | POA: Diagnosis not present

## 2019-10-14 DIAGNOSIS — R7303 Prediabetes: Secondary | ICD-10-CM | POA: Diagnosis not present

## 2019-10-14 DIAGNOSIS — M545 Other chronic pain: Secondary | ICD-10-CM | POA: Insufficient documentation

## 2019-10-14 DIAGNOSIS — Z6830 Body mass index (BMI) 30.0-30.9, adult: Secondary | ICD-10-CM

## 2019-10-14 DIAGNOSIS — R42 Dizziness and giddiness: Secondary | ICD-10-CM

## 2019-10-14 IMAGING — DX DG LUMBAR SPINE COMPLETE 4+V
5 series · 5 of 5 positions shown · non-contrast
Comparison: None.

CLINICAL DATA: Low back pain for 1 year

EXAM:
LUMBAR SPINE - COMPLETE 4+ VIEW

[l-spine ap]
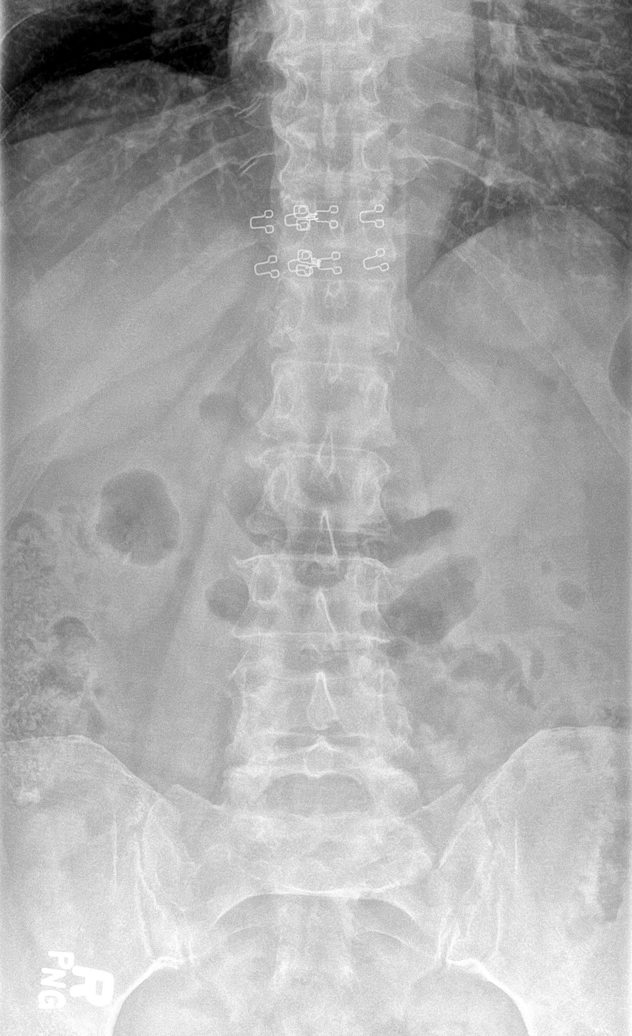

[l-spine obl (1 of 2)]
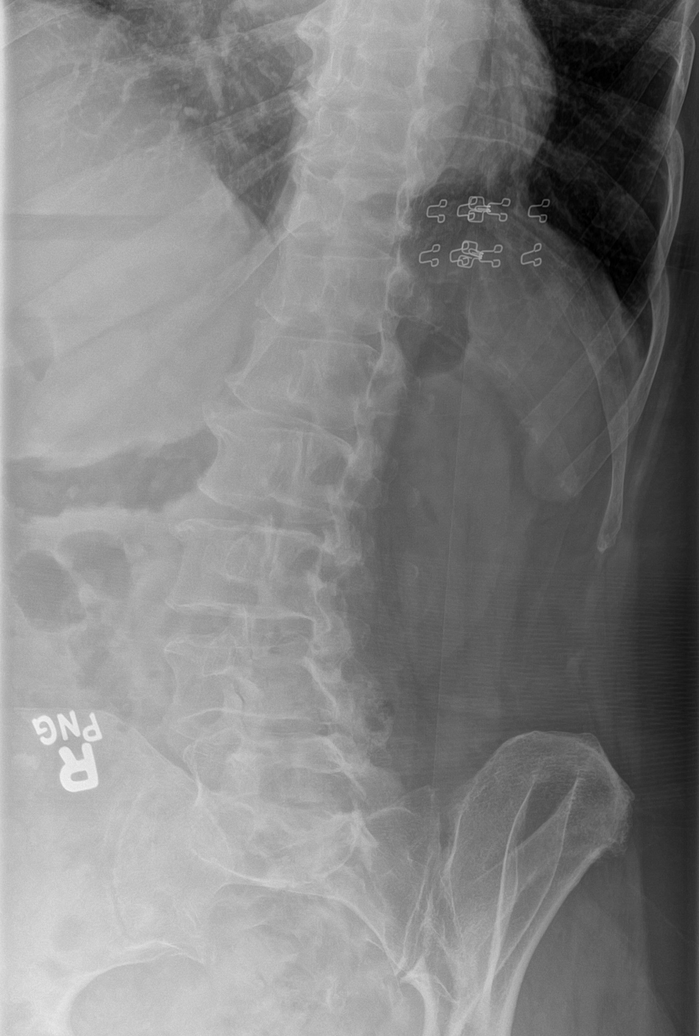

[l-spine obl (2 of 2)]
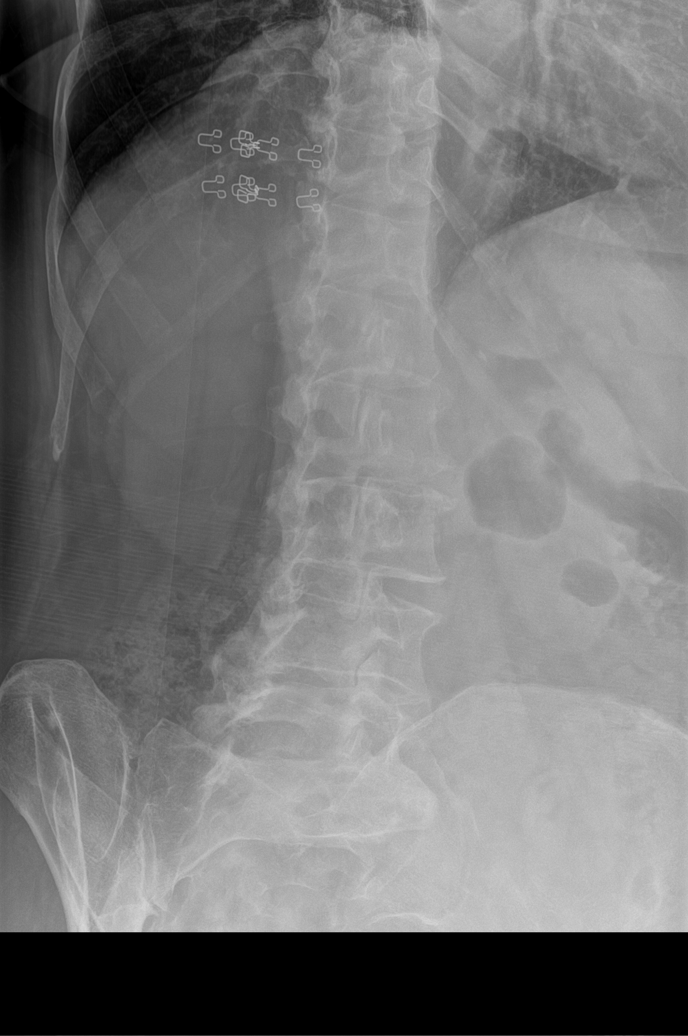

[l-spine lateral]
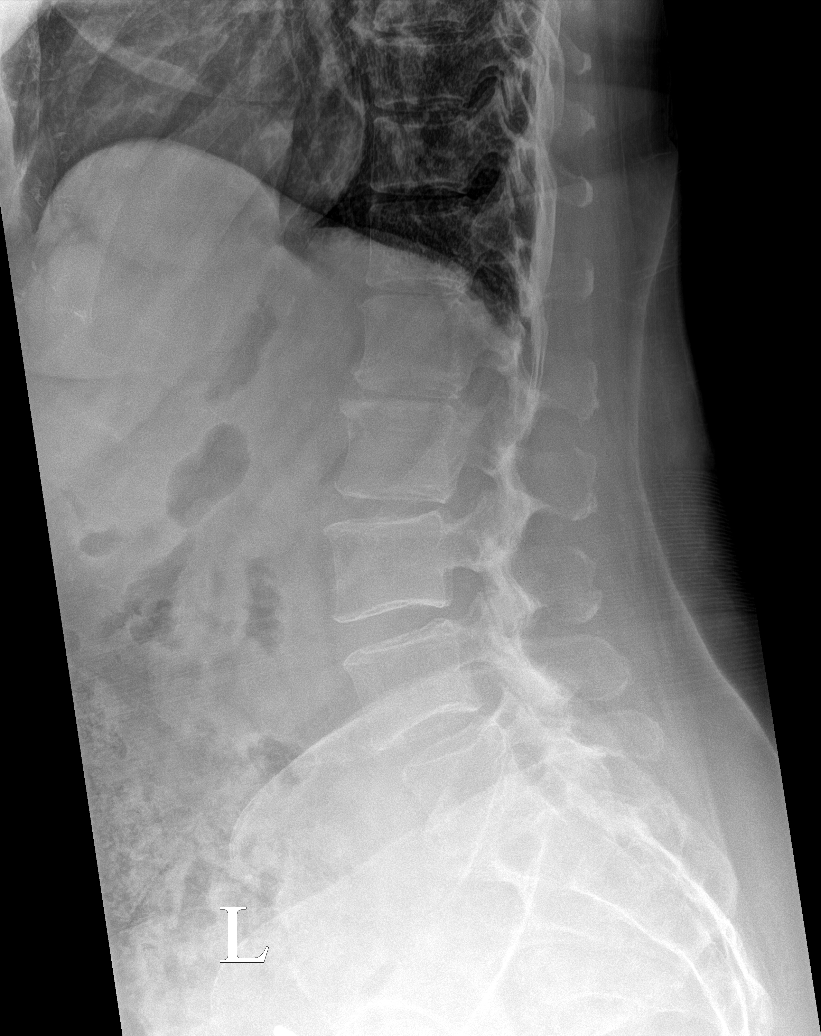

[l-spine spot]
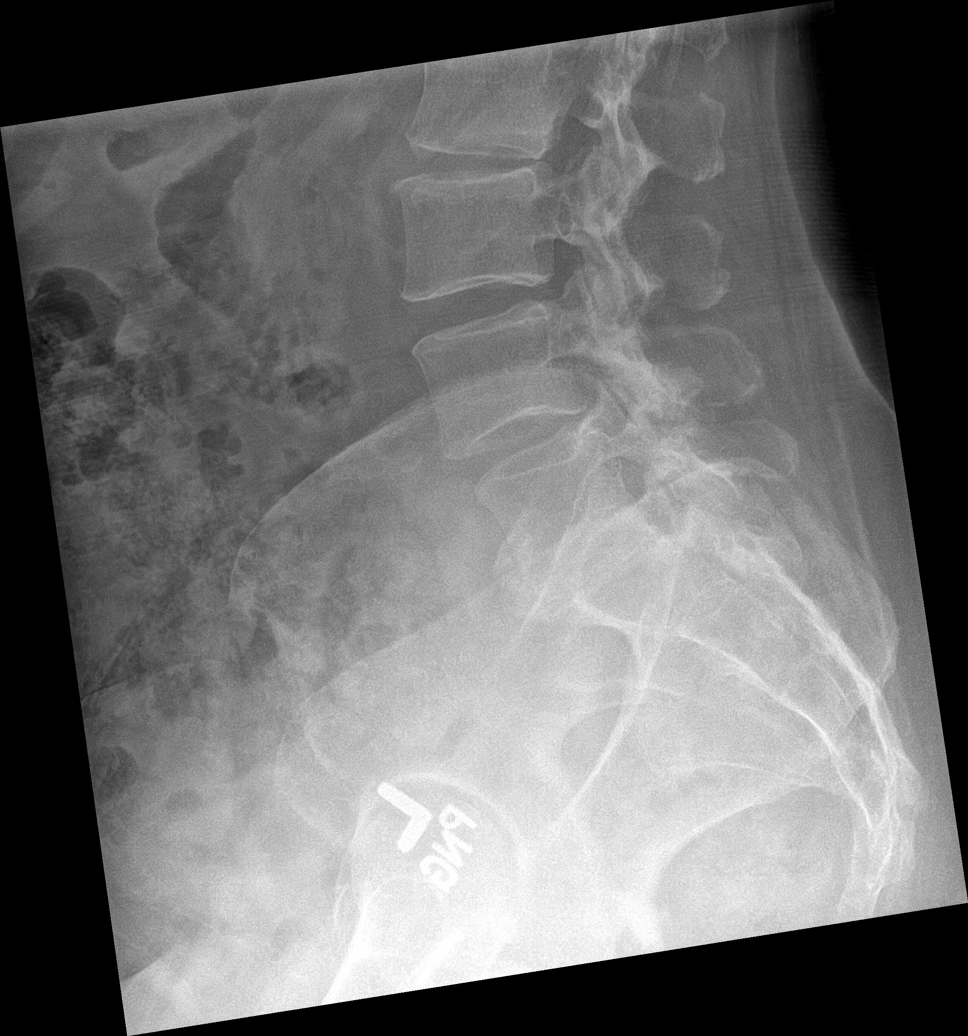

[5 of 5 positions shown; findings below may reference images not displayed]

FINDINGS: Osteopenia. No fracture or dislocation of the lumbar spine. Mild
multilevel disc space height loss and osteophytosis. Mild facet
degenerative change of the lower lumbar levels. Nonobstructive
pattern of overlying bowel gas.
IMPRESSION: 1.  Osteopenia.

2. Mild multilevel disc space height loss and osteophytosis. Mild
facet degenerative change of the lower lumbar levels. Lumbar disc
and neural foraminal pathology may be further evaluated by MRI if
indicated by neurologically localizing signs and symptoms.

## 2019-10-14 MED ORDER — MECLIZINE HCL 12.5 MG PO TABS: 12.5000 mg | ORAL_TABLET | Freq: Three times a day (TID) | ORAL | 0 refills | Status: DC | PRN

## 2019-10-14 NOTE — Patient Instructions (Signed)
We will do an x-ray of the low back.

## 2019-10-14 NOTE — Assessment & Plan Note (Signed)
Not due for HgA1c but education provided today and explained how HgA1c readings work and why diet changes and weight loss is important in preventing progression to diabetes.

## 2019-10-14 NOTE — Assessment & Plan Note (Signed)
Could be related to gait but checking x-ray to rule out changes in the spine and evaluate for need to see orthopedics.

## 2019-10-14 NOTE — Assessment & Plan Note (Signed)
Likely BPPV and given epley maneuver information and rx meclizine for any flares.

## 2019-10-14 NOTE — Assessment & Plan Note (Signed)
Weight is stable today. She is wanting to discuss various diet methods and apps and systems in detail today which we did and offered advice on what might work best for her. Given her pre-diabetes she needs to work on decreasing size of carbohydrates and work on weight loss to prevent diabetes. Talked to her about the etiology of pre-diabetes and the methodology for testing HgA1c.

## 2019-10-14 NOTE — Progress Notes (Signed)
   Subjective:   Patient ID: Dana Small, female    DOB: April 25, 1945, 74 y.o.   MRN: [id number]  HPI The patient is a 74 YO female coming in for concerns about low back pain (started with foot problem, she feels that she is walking differently due to right foot problem, she is having pain in the low back and hip, has been taking otc medications which do not help much, is wondering if she needs to see orthopedics) and dizziness (has had 5 episodes each lasting less than 30 seconds, with feeling of room spinning, some mild nausea but no vomiting, needs to hold on until this passes, last episode about 3-4 weeks ago or more, has not tried anything for it, overall not increasing in frequency, denies ear pain or pressure, denies sinus problems), and weight (she is working on weight loss, cannot figure out what to try with diet, wants to discuss various options).   Review of Systems  Constitutional: Negative.   HENT: Negative.   Eyes: Negative.   Respiratory: Negative for cough, chest tightness and shortness of breath.   Cardiovascular: Negative for chest pain, palpitations and leg swelling.  Gastrointestinal: Negative for abdominal distention, abdominal pain, constipation, diarrhea, nausea and vomiting.  Musculoskeletal: Positive for arthralgias and back pain.  Skin: Negative.   Neurological: Positive for dizziness. Negative for weakness, light-headedness, numbness and headaches.  Psychiatric/Behavioral: Negative.     Objective:  Physical Exam Constitutional:      Appearance: She is well-developed.  HENT:     Head: Normocephalic and atraumatic.     Right Ear: Tympanic membrane normal.     Left Ear: Tympanic membrane normal.  Cardiovascular:     Rate and Rhythm: Normal rate and regular rhythm.  Pulmonary:     Effort: Pulmonary effort is normal. No respiratory distress.     Breath sounds: Normal breath sounds. No wheezing or rales.  Abdominal:     General: Bowel sounds are normal. There is  no distension.     Palpations: Abdomen is soft.     Tenderness: There is no abdominal tenderness. There is no rebound.  Musculoskeletal:        General: Tenderness present.     Cervical back: Normal range of motion.  Skin:    General: Skin is warm and dry.  Neurological:     Mental Status: She is alert and oriented to person, place, and time.     Coordination: Coordination normal.     Vitals:   10/14/19 0948  BP: (!) 144/92  Pulse: 64  Temp: 98.3 F (36.8 C)  TempSrc: Oral  SpO2: 98%  Weight: 171 lb (77.6 kg)  Height: 5' 4.5" (1.638 m)    This visit occurred during the SARS-CoV-2 public health emergency.  Safety protocols were in place, including screening questions prior to the visit, additional usage of staff PPE, and extensive cleaning of exam room while observing appropriate contact time as indicated for disinfecting solutions.   Assessment & Plan:

## 2019-10-27 ENCOUNTER — Telehealth: Payer: Self-pay

## 2019-10-27 DIAGNOSIS — R42 Dizziness and giddiness: Secondary | ICD-10-CM

## 2019-10-27 NOTE — Telephone Encounter (Signed)
I have spoke to the patient. She stated that she is still having vertigo issues. It was really bad again last night. She stated that she has been taking the medication but she would like a referral because per the patient "its scaring me". Please advise.

## 2019-10-27 NOTE — Telephone Encounter (Signed)
There is already some other kind of message about this, did we find out if she is actually having vertigo currently? If so, better or worse than at recent visit?

## 2019-10-27 NOTE — Telephone Encounter (Signed)
Called pt, LVM.   

## 2019-10-27 NOTE — Telephone Encounter (Signed)
F/u  Returning call back to the CMA.  

## 2019-10-27 NOTE — Addendum Note (Signed)
Addended by: Pricilla Holm A on: 10/27/2019 01:45 PM   Modules accepted: Orders

## 2019-10-27 NOTE — Telephone Encounter (Signed)
Pt has been informed.

## 2019-10-27 NOTE — Telephone Encounter (Signed)
New message   Sent a my chart message    Referral to ENT   Reason : vertigo   Facility : [facility]

## 2019-10-27 NOTE — Telephone Encounter (Signed)
Referral placed.

## 2019-10-31 ENCOUNTER — Telehealth: Payer: Self-pay

## 2019-10-31 NOTE — Telephone Encounter (Signed)
New message    Need a referral to ENT upcoming appt on  8.19.2021 to see Dr. Redmond Baseman

## 2019-11-03 NOTE — Telephone Encounter (Signed)
MD is out of the office this week will hold until she return next week for referral../lmb

## 2019-11-10 NOTE — Telephone Encounter (Signed)
Please see referrals tab, also multiple other phone notes about same topic. This referral has already been done.

## 2019-12-02 ENCOUNTER — Encounter (INDEPENDENT_AMBULATORY_CARE_PROVIDER_SITE_OTHER): Payer: Self-pay | Admitting: Otolaryngology

## 2019-12-02 ENCOUNTER — Other Ambulatory Visit: Payer: Self-pay

## 2019-12-02 ENCOUNTER — Ambulatory Visit (INDEPENDENT_AMBULATORY_CARE_PROVIDER_SITE_OTHER): Payer: Medicare Other | Admitting: Otolaryngology

## 2019-12-02 VITALS — Temp 97.7°F

## 2019-12-02 DIAGNOSIS — R42 Dizziness and giddiness: Secondary | ICD-10-CM

## 2019-12-02 NOTE — Progress Notes (Signed)
HPI: Dana Small is a 74 y.o. female who presents is referred by her PCP for evaluation of vertigo that she has had for about 2 months.  She was given.  Meclizine to help with the dizziness.  She initially had an episode of dizziness about 2 months ago while she was in the kitchen bending over working on chopping up some food.  She described a sensation of spinning that lasted for 10 to 15 minutes.  She has had a couple other episodes that have happened while she was in the kitchen doing similar work but were not as bad as the initial episode.  She has had no vertigo or dizziness at night while she is in bed.  She did have a brief episode while in the car several days ago.  But apparently it most often happens while she is in the kitchen.  She has not noticed any ear fullness or change in her hearing associated with the episodes.  She does note that she does not hear quite as well from her right ear as she does her left but this has been longstanding.  Past Medical History:  Diagnosis Date  . Arthritis   . Hypertension    Past Surgical History:  Procedure Laterality Date  . COLONOSCOPY  06/11/2013   [facility]  . TOE SURGERY    . tooth implant     Social History   Socioeconomic History  . Marital status: Married    Spouse name: Not on file  . Number of children: Not on file  . Years of education: Not on file  . Highest education level: Not on file  Occupational History  . Not on file  Tobacco Use  . Smoking status: Never Smoker  . Smokeless tobacco: Never Used  Vaping Use  . Vaping Use: Never used  Substance and Sexual Activity  . Alcohol use: Yes  . Drug use: Never  . Sexual activity: Not on file  Other Topics Concern  . Not on file  Social History Narrative  . Not on file   Social Determinants of Health   Financial Resource Strain:   . Difficulty of Paying Living Expenses:   Food Insecurity:   . Worried About Charity fundraiser in the Last Year:   . Arts development officer in the Last Year:   Transportation Needs:   . Film/video editor (Medical):   Marland Kitchen Lack of Transportation (Non-Medical):   Physical Activity:   . Days of Exercise per Week:   . Minutes of Exercise per Session:   Stress:   . Feeling of Stress :   Social Connections:   . Frequency of Communication with Friends and Family:   . Frequency of Social Gatherings with Friends and Family:   . Attends Religious Services:   . Active Member of Clubs or Organizations:   . Attends Archivist Meetings:   Marland Kitchen Marital Status:    Family History  Problem Relation Age of Onset  . Heart disease Other        Parents  . Hypertension Other        Parents  . Diverticulitis Father   . Breast cancer Neg Hx   . Colon cancer Neg Hx   . Esophageal cancer Neg Hx   . Cancer Neg Hx   . Stomach cancer Neg Hx   . Rectal cancer Neg Hx    No Known Allergies Prior to Admission medications   Medication Sig Start  Date End Date Taking? Authorizing Provider  Cholecalciferol (VITAMIN D3 PO) Take by mouth daily.   Yes [provider]  Ibuprofen (ADVIL PO) Take by mouth as needed.   Yes [provider]  meclizine (ANTIVERT) 12.5 MG tablet Take 1 tablet (12.5 mg total) by mouth 3 (three) times daily as needed for dizziness. 10/14/19  Yes Hoyt Koch, MD  Multiple Vitamins-Minerals (MULTIVITAMIN ADULT PO) Take by mouth daily.   Yes [provider]  triamterene-hydrochlorothiazide (MAXZIDE-25) 37.5-25 MG tablet TAKE 1 TABLET BY MOUTH DAILY 07/28/19  Yes Hoyt Koch, MD     Positive ROS: Otherwise negative  All other systems have been reviewed and were otherwise negative with the exception of those mentioned in the HPI and as above.  Physical Exam: Constitutional: Alert, well-appearing, no acute distress Ears: External ears without lesions or tenderness. Ear canals are clear bilaterally with intact, clear TMs bilaterally.  On Dix-Hallpike testing she  had no clinical evidence of BPPV.  On tuning fork testing she has slight diminished hearing in the right ear compared to the left with AC > BC bilaterally. Nasal: External nose without lesions. Clear nasal passages bilaterally. Oral: Lips and gums without lesions. Tongue and palate mucosa without lesions. Posterior oropharynx clear. Neck: No palpable adenopathy or masses Respiratory: Breathing comfortably  Skin: No facial/neck lesions or rash noted.  Procedures  Assessment: Episodes of dizziness are not totally consistent with BPPV.  I am unable to elicit any evidence of BPPV in the office today.  Plan: She will follow-up in 1 month for recheck and audiologic testing as she does have mild hearing loss. I gave her information on BPPV. Suggested taking one baby aspirin a day as this could be vascular.   Radene Journey, MD   CC:

## 2019-12-15 ENCOUNTER — Telehealth: Payer: Self-pay | Admitting: Internal Medicine

## 2019-12-15 DIAGNOSIS — R42 Dizziness and giddiness: Secondary | ICD-10-CM

## 2019-12-15 NOTE — Addendum Note (Signed)
Addended by: Binnie Rail on: 12/15/2019 12:14 PM   Modules accepted: Orders

## 2019-12-15 NOTE — Telephone Encounter (Signed)
   Patient calling to request a referral to Neurologist, Dr Jaynee Eagles to discuss dizziness /vertigo Patient states she has seen Dr Lucia Gaskins for questionable vertigo but would prefer to see Neurologist Advised patient Dr Sharlet Salina is on leave, declined appointment at this time

## 2019-12-15 NOTE — Telephone Encounter (Signed)
Referral ordered

## 2019-12-15 NOTE — Telephone Encounter (Signed)
Pt had an OV on 6/29 with PCP to discuss "dizziness". Would pt need another OV for a referral or would the appt suffice? Please advise.

## 2019-12-17 ENCOUNTER — Ambulatory Visit (INDEPENDENT_AMBULATORY_CARE_PROVIDER_SITE_OTHER): Payer: Medicare Other | Admitting: Otolaryngology

## 2019-12-17 ENCOUNTER — Encounter (INDEPENDENT_AMBULATORY_CARE_PROVIDER_SITE_OTHER): Payer: Self-pay | Admitting: Otolaryngology

## 2019-12-17 ENCOUNTER — Other Ambulatory Visit: Payer: Self-pay

## 2019-12-17 VITALS — Temp 97.5°F

## 2019-12-17 DIAGNOSIS — H903 Sensorineural hearing loss, bilateral: Secondary | ICD-10-CM | POA: Diagnosis not present

## 2019-12-17 DIAGNOSIS — R42 Dizziness and giddiness: Secondary | ICD-10-CM

## 2019-12-17 NOTE — Progress Notes (Signed)
HPI: Dana Small is a 74 y.o. female who returns today for evaluation of dizziness.  She has been taken meclizine as well as a baby aspirin but has continued to have these episodes of vertigo that are not necessarily occurring on a regular basis.  She has been taken meclizine although she has not taken any meclizine today.  She apparently had a bad episode a couple of days ago.  She is scheduled to see a neurologist later this week.  She has not noted any significant change in her hearing although she does have diminished hearing in both ears.  She has had no earache or headache.  Past Medical History:  Diagnosis Date  . Arthritis   . Hypertension    Past Surgical History:  Procedure Laterality Date  . COLONOSCOPY  06/11/2013   [facility]  . TOE SURGERY    . tooth implant     Social History   Socioeconomic History  . Marital status: Married    Spouse name: Not on file  . Number of children: Not on file  . Years of education: Not on file  . Highest education level: Not on file  Occupational History  . Not on file  Tobacco Use  . Smoking status: Never Smoker  . Smokeless tobacco: Never Used  Vaping Use  . Vaping Use: Never used  Substance and Sexual Activity  . Alcohol use: Yes  . Drug use: Never  . Sexual activity: Not on file  Other Topics Concern  . Not on file  Social History Narrative  . Not on file   Social Determinants of Health   Financial Resource Strain:   . Difficulty of Paying Living Expenses: Not on file  Food Insecurity:   . Worried About Charity fundraiser in the Last Year: Not on file  . Ran Out of Food in the Last Year: Not on file  Transportation Needs:   . Lack of Transportation (Medical): Not on file  . Lack of Transportation (Non-Medical): Not on file  Physical Activity:   . Days of Exercise per Week: Not on file  . Minutes of Exercise per Session: Not on file  Stress:   . Feeling of Stress : Not on file  Social Connections:    . Frequency of Communication with Friends and Family: Not on file  . Frequency of Social Gatherings with Friends and Family: Not on file  . Attends Religious Services: Not on file  . Active Member of Clubs or Organizations: Not on file  . Attends Archivist Meetings: Not on file  . Marital Status: Not on file   Family History  Problem Relation Age of Onset  . Heart disease Other        Parents  . Hypertension Other        Parents  . Diverticulitis Father   . Breast cancer Neg Hx   . Colon cancer Neg Hx   . Esophageal cancer Neg Hx   . Cancer Neg Hx   . Stomach cancer Neg Hx   . Rectal cancer Neg Hx    No Known Allergies Prior to Admission medications   Medication Sig Start Date End Date Taking? Authorizing Provider  Cholecalciferol (VITAMIN D3 PO) Take by mouth daily.   Yes [provider]  Ibuprofen (ADVIL PO) Take by mouth as needed.   Yes [provider]  meclizine (ANTIVERT) 12.5 MG tablet Take 1 tablet (12.5 mg total) by mouth 3 (three) times daily  as needed for dizziness. 10/14/19  Yes Hoyt Koch, MD  Multiple Vitamins-Minerals (MULTIVITAMIN ADULT PO) Take by mouth daily.   Yes [provider]  triamterene-hydrochlorothiazide (MAXZIDE-25) 37.5-25 MG tablet TAKE 1 TABLET BY MOUTH DAILY 07/28/19  Yes Hoyt Koch, MD     Positive ROS: Otherwise negative  All other systems have been reviewed and were otherwise negative with the exception of those mentioned in the HPI and as above.  Physical Exam: Constitutional: Alert, well-appearing, no acute distress Ears: External ears without lesions or tenderness. Ear canals are clear bilaterally with intact, clear TMs bilaterally.  On Dix-Hallpike testing in the office today she had no clinical evidence of BPPV.  On hearing screening with the 1024 tuning fork she had a mild SNHL in both ears which was symmetric. Nasal: External nose without lesions. Clear nasal passages Oral:  Lips and gums without lesions. Tongue and palate mucosa without lesions. Posterior oropharynx clear. Neck: No palpable adenopathy or masses Respiratory: Breathing comfortably  Skin: No facial/neck lesions or rash noted.  Procedures  Assessment: History of episodes of dizziness and/or vertigo. Mild hearing loss in both ears.  Plan: Patient is scheduled to see a neurologist this next week. We will go ahead and schedule VNG testing and audiologic testing to evaluate vestibular function in 2 to 3 weeks pending results of neurology findings.   Radene Journey, MD

## 2019-12-18 ENCOUNTER — Encounter: Payer: Self-pay | Admitting: Neurology

## 2019-12-18 ENCOUNTER — Telehealth: Payer: Self-pay | Admitting: Neurology

## 2019-12-18 ENCOUNTER — Other Ambulatory Visit: Payer: Self-pay

## 2019-12-18 ENCOUNTER — Ambulatory Visit (INDEPENDENT_AMBULATORY_CARE_PROVIDER_SITE_OTHER): Payer: Medicare Other | Admitting: Neurology

## 2019-12-18 VITALS — BP 190/79 | HR 71 | Ht 64.0 in | Wt 183.0 lb

## 2019-12-18 DIAGNOSIS — I10 Essential (primary) hypertension: Secondary | ICD-10-CM

## 2019-12-18 DIAGNOSIS — H814 Vertigo of central origin: Secondary | ICD-10-CM

## 2019-12-18 DIAGNOSIS — R42 Dizziness and giddiness: Secondary | ICD-10-CM | POA: Diagnosis not present

## 2019-12-18 DIAGNOSIS — I169 Hypertensive crisis, unspecified: Secondary | ICD-10-CM | POA: Diagnosis not present

## 2019-12-18 DIAGNOSIS — H8302 Labyrinthitis, left ear: Secondary | ICD-10-CM | POA: Diagnosis not present

## 2019-12-18 NOTE — Progress Notes (Signed)
Provider:  Larey Seat, M D  Referring Provider: Hoyt Koch, * Primary Care Physician:  Hoyt Koch, MD  Chief Complaint  Patient presents with  . New Patient (Initial Visit)    pt alone, rm 11. presents today with having concerns with dizziness. she has been followed by ENT who has evaluated her and doesn't feel it's positional vertigo. She states she can't pinpoint if there is a particular thing that causes this but she has very high BP. Marland Kitchen    HPI:  Dana Small is a 74 y.o. female patient is seen here on 12-18-2019 upon a request for consultation /referral from Dr. Lucia Gaskins, ENT.      I have the pleasure of seeing Dana Small today in a face-to-face visit here she was referred by her primary care physician, Dr. Pricilla Holm MD, to ENT Dr. Leonides Sake. Newman.  By the time she reached time she had tried meclizine.date right That the sensation of spinning did not be effective much and she describes it as a sensation of spinning lasted for up to 15 minutes.  She had a couple of episodes while meclizine.  She has not had of vertigo or dizziness episodes in the past so this is a new phenomenon and it is not happening while she is in bed.  She did have 1 episode while being seated in a car driving.  It apparently happens most often when she is in the kitchen standing and moving.  There has been no ear pain but today she presents with high blood pressure and she has had blood pressure in the past and nonsteroidals seem to increase her hypertension.  She reports that Dr. Lucia Gaskins found no evidence of benign positional vertigo.  Dr. Lucia Gaskins saw her on 17 August and again on 1 September which was yesterday.  She stated that she had a bad episode again in the last days of August.  Her current medications include vitamin D3 meclizine, which is at a low dose 12.5 mg, she takes multivitamins and she is on Maxide 25 for blood pressure control.  She does not have significant  hearing loss but mild hearing loss has been established.  Apparently Dr. Lucia Gaskins already arranged for a VNG testing and audiologic testing to evaluate vestibular function.  As she presented here today she had a blood pressure of 194/80 in the supine position 63 heart rate regular then seated her blood pressure rose further 195/76 with a heart rate of 66 and as she stood up her heart rate increased to 71 which is physiologically normal but her blood pressure did not decrease it was again measured at 190/79 mmHg.  This all followed at baseline blood pressure that was taken when she was first presenting to the office of 188/89 mmHg with 67 heart rate.  Dana Small wrote an exact accounts of what she has experienced and I would like to add this later to my notes.  This is only the report from Saturday, August 28 through now.  She states she was power washing the front of her house with steps and for about 30 minutes when she stopped washing and turned her head to the left her head began to spin she could only prevent herself from falling when she grabbed her husband's arm for support.  The spinning lasted for 3 to 4 minutes went step when it stopped she moved to a chair outside for about 5 minutes when she went back into the  house and as he looked down at the counter in the kitchen her head again began to spin for 3 to 4 minutes.  On Sunday she sat quietly all day no spinning she had taken to meclizine that day on Monday, August 30 years at 940 she was sitting in her den 40 minutes after taking meclizine and her head started spinning for about 30 minutes set seconds sorry she had turned her head to the left to talk to her husband and the vertigo syndrome.  Lasted maybe another 30 seconds.  On Tuesday she took a pill in the morning had no spinning all day on Wednesday which is yesterday but 1 September she was reading the paper looking down and her head was spinning for several seconds when she took the pill but at  930 she had another small episode of spinning within 30 minutes of taking meclizine.  Review of Systems: Out of a complete 14 system review, the patient complains of only the following symptoms, and all other reviewed systems are negative.   Vertigo wit head-movements to the left and down.   Social History   Socioeconomic History  . Marital status: Married    Spouse name: Not on file  . Number of children: Not on file  . Years of education: Not on file  . Highest education level: Not on file  Occupational History  . Not on file  Tobacco Use  . Smoking status: Never Smoker  . Smokeless tobacco: Never Used  Vaping Use  . Vaping Use: Never used  Substance and Sexual Activity  . Alcohol use: Yes  . Drug use: Never  . Sexual activity: Not on file  Other Topics Concern  . Not on file  Social History Narrative  . Not on file   Social Determinants of Health   Financial Resource Strain:   . Difficulty of Paying Living Expenses: Not on file  Food Insecurity:   . Worried About Charity fundraiser in the Last Year: Not on file  . Ran Out of Food in the Last Year: Not on file  Transportation Needs:   . Lack of Transportation (Medical): Not on file  . Lack of Transportation (Non-Medical): Not on file  Physical Activity:   . Days of Exercise per Week: Not on file  . Minutes of Exercise per Session: Not on file  Stress:   . Feeling of Stress : Not on file  Social Connections:   . Frequency of Communication with Friends and Family: Not on file  . Frequency of Social Gatherings with Friends and Family: Not on file  . Attends Religious Services: Not on file  . Active Member of Clubs or Organizations: Not on file  . Attends Archivist Meetings: Not on file  . Marital Status: Not on file  Intimate Partner Violence:   . Fear of Current or Ex-Partner: Not on file  . Emotionally Abused: Not on file  . Physically Abused: Not on file  . Sexually Abused: Not on file     Family History  Problem Relation Age of Onset  . Heart disease Other        Parents  . Hypertension Other        Parents  . Diverticulitis Father   . Breast cancer Neg Hx   . Colon cancer Neg Hx   . Esophageal cancer Neg Hx   . Cancer Neg Hx   . Stomach cancer Neg Hx   . Rectal cancer Neg  Hx     Past Medical History:  Diagnosis Date  . Arthritis   . Hypertension     Past Surgical History:  Procedure Laterality Date  . COLONOSCOPY  06/11/2013   [facility]  . TOE SURGERY    . tooth implant      Current Outpatient Medications  Medication Sig Dispense Refill  . Cholecalciferol (VITAMIN D3 PO) Take by mouth daily.    . Ibuprofen (ADVIL PO) Take by mouth as needed.    . meclizine (ANTIVERT) 12.5 MG tablet Take 1 tablet (12.5 mg total) by mouth 3 (three) times daily as needed for dizziness. 30 tablet 0  . Multiple Vitamins-Minerals (MULTIVITAMIN ADULT PO) Take by mouth daily.    Marland Kitchen triamterene-hydrochlorothiazide (MAXZIDE-25) 37.5-25 MG tablet TAKE 1 TABLET BY MOUTH DAILY 90 tablet 2   No current facility-administered medications for this visit.    Allergies as of 12/18/2019  . (No Known Allergies)    Vitals: BP (!) 190/79 (BP Location: Left Arm, Patient Position: Standing)   Pulse 71   Ht 5\' 4"  (1.626 m)   Wt 183 lb (83 kg)   BMI 31.41 kg/m  Last Weight:  Wt Readings from Last 1 Encounters:  12/18/19 183 lb (83 kg)   Last Height:   Ht Readings from Last 1 Encounters:  12/18/19 5\' 4"  (1.626 m)    Physical exam:  General: The patient is awake, alert and appears not in acute distress. The patient is well groomed. Head: Normocephalic, atraumatic. Neck is supple. Mallampati 2, neck circumference:15.5. Cardiovascular:  Regular rate and rhythm, without  murmurs or carotid bruit, and without distended neck veins. Respiratory: Lungs are clear to auscultation. Skin:  Without evidence of edema, or rash Trunk: BMI is elevated - this  patient  has  normal posture.  Neurologic exam : The patient is awake and alert, oriented to place and time.  Memory subjective  described as intact. There is a normal attention span & concentration ability. Speech is fluent without dysarthria, dysphonia or aphasia. Mood and affect are appropriate.  Cranial nerves: Pupils are equal and briskly reactive to light. Funduscopic exam without evidence of pallor or edema. Extraocular movements  in vertical and horizontal planes reveal a 5 beat nystagmus to the left and right, but stronger wit gaze to the left- not vertical - and after rapid head movements she has clearly a more pronounced nystagmus to the left .  Visual fields by finger perimetry are intact. Hearing to finger rub intact.  Facial sensation intact to fine touch. Facial motor strength is symmetric and tongue and uvula move midline. Tongue protrusion into either cheek is normal. Shoulder shrug is normal.   Motor exam:   Normal tone ,muscle bulk and symmetric  strength in all extremities.  Sensory:  Fine touch, pinprick and vibration were tested in all extremities. Proprioception was normal.  Coordination: Rapid alternating movements in the fingers/hands were normal.  Finger-to-nose maneuver  normal without evidence of ataxia, dysmetria or tremor.  Gait and station: Patient walks without assistive device  Deep tendon reflexes: in the  upper and lower extremities are symmetric and intact. Babinski maneuver response deferred.   Assessment:  After physical and neurologic examination, review of laboratory studies, imaging, neurophysiology testing and pre-existing records, assessment is that of :    Suspected vestibular dysfunction- posterior. Referral to vestibular rehab, MRI brain.      Extreme hypertension today,  Note send to PCP>      Larey Seat MD  12/18/2019             

## 2019-12-18 NOTE — Patient Instructions (Signed)

## 2019-12-18 NOTE — Telephone Encounter (Signed)
Medicare/tricare order sent to GI. No auth they will reach out to the patient to schedule.  

## 2019-12-18 NOTE — Addendum Note (Signed)
Addended by: Larey Seat on: 12/18/2019 01:45 PM   Modules accepted: Orders

## 2019-12-19 ENCOUNTER — Ambulatory Visit (INDEPENDENT_AMBULATORY_CARE_PROVIDER_SITE_OTHER): Payer: Medicare Other | Admitting: Family

## 2019-12-19 VITALS — BP 158/70 | HR 73 | Temp 98.0°F | Ht 64.0 in | Wt 183.0 lb

## 2019-12-19 DIAGNOSIS — I10 Essential (primary) hypertension: Secondary | ICD-10-CM | POA: Diagnosis not present

## 2019-12-19 DIAGNOSIS — R42 Dizziness and giddiness: Secondary | ICD-10-CM | POA: Diagnosis not present

## 2019-12-19 LAB — COMPREHENSIVE METABOLIC PANEL
ALT: 20 IU/L (ref 0–32)
AST: 19 IU/L (ref 0–40)
Albumin/Globulin Ratio: 1.7 (ref 1.2–2.2)
Albumin: 4.5 g/dL (ref 3.7–4.7)
Alkaline Phosphatase: 82 IU/L (ref 48–121)
BUN/Creatinine Ratio: 22 (ref 12–28)
BUN: 18 mg/dL (ref 8–27)
Bilirubin Total: 0.2 mg/dL (ref 0.0–1.2)
CO2: 29 mmol/L (ref 20–29)
Calcium: 9.8 mg/dL (ref 8.7–10.3)
Chloride: 100 mmol/L (ref 96–106)
Creatinine, Ser: 0.81 mg/dL (ref 0.57–1.00)
GFR calc Af Amer: 83 mL/min/{1.73_m2} (ref >59)
GFR calc non Af Amer: 72 mL/min/{1.73_m2} (ref >59)
Globulin, Total: 2.6 g/dL (ref 1.5–4.5)
Glucose: 84 mg/dL (ref 65–99)
Potassium: 3.5 mmol/L (ref 3.5–5.2)
Sodium: 141 mmol/L (ref 134–144)
Total Protein: 7.1 g/dL (ref 6.0–8.5)

## 2019-12-19 MED ORDER — LOSARTAN POTASSIUM-HCTZ 100-25 MG PO TABS
1.0000 | ORAL_TABLET | Freq: Every day | ORAL | 0 refills | Status: DC
Start: 2019-12-19 — End: 2020-01-08

## 2019-12-19 NOTE — Progress Notes (Signed)
Dana Small is a 74 y.o. female with the following history as recorded in [employer]:  Patient Active Problem List   Diagnosis Date Noted  . Chronic bilateral low back pain without sciatica 10/14/2019  . Dizziness 10/14/2019  . Pre-diabetes 10/14/2019  . Obesity 04/25/2019  . Rectal pain 05/17/2018  . Diarrhea 05/17/2018  . Essential hypertension 06/18/2014  . Osteoarthritis 06/18/2014  . Hearing loss in right ear 06/18/2014  . Routine general medical examination at a health care facility 06/18/2014    Current Outpatient Medications  Medication Sig Dispense Refill  . Cholecalciferol (VITAMIN D3 PO) Take by mouth daily.    . Ibuprofen (ADVIL PO) Take by mouth as needed.    Marland Kitchen losartan-hydrochlorothiazide (HYZAAR) 100-25 MG tablet Take 1 tablet by mouth daily. 30 tablet 0  . meclizine (ANTIVERT) 12.5 MG tablet Take 1 tablet (12.5 mg total) by mouth 3 (three) times daily as needed for dizziness. 30 tablet 0  . Multiple Vitamins-Minerals (MULTIVITAMIN ADULT PO) Take by mouth daily.     No current facility-administered medications for this visit.    Allergies: Patient has no known allergies.  Past Medical History:  Diagnosis Date  . Arthritis   . Hypertension     Past Surgical History:  Procedure Laterality Date  . COLONOSCOPY  06/11/2013   [facility]  . TOE SURGERY    . tooth implant      Family History  Problem Relation Age of Onset  . Heart disease Other        Parents  . Hypertension Other        Parents  . Diverticulitis Father   . Breast cancer Neg Hx   . Colon cancer Neg Hx   . Esophageal cancer Neg Hx   . Cancer Neg Hx   . Stomach cancer Neg Hx   . Rectal cancer Neg Hx     Social History   Tobacco Use  . Smoking status: Never Smoker  . Smokeless tobacco: Never Used  Substance Use Topics  . Alcohol use: Yes    Subjective:  Patient was seen at ENT/ neurology in the past 2 weeks with concerns for persistent dizziness; was at neurology  yesterday and blood pressure was noted to be extremely elevated;  In reviewing blood pressure readings, blood pressure has been elevated since November 2020; Denies any chest pain or shortness of breath; denies any irregular heartbeat;      Objective:  Vitals:   12/19/19 0909  BP: (!) 158/70  Pulse: 73  Temp: 98 F (36.7 C)  SpO2: 99%  Weight: 183 lb (83 kg)  Height: 5\' 4"  (1.626 m)    General: Well developed, well nourished, in no acute distress  Head: Normocephalic and atraumatic  Lungs: Respirations unlabored; clear to auscultation bilaterally without wheeze, rales, rhonchi  CVS exam: normal rate and regular rhythm.  Neurologic: Alert and oriented; speech intact; face symmetrical; moves all extremities well; CNII-XII intact without focal deficit   Assessment:  1. Essential hypertension   2. Dizziness     Plan:  Uncontrolled- suspect this is also contributing to her dizziness; Reviewed CMP that was done at neurology yesterday; patient defers having EKG today- wants to treat her blood pressure first; D/C Triam/ HCTZ- change to Losartan HCT 100/25 daily;  Follow-up in 2 weeks- patient prefers to see MDs and will be scheduled accordingly for follow-up; her PCP will be back in early November;   Return in about 2 weeks (around 01/02/2020).  No orders  of the defined types were placed in this encounter.   Requested Prescriptions   Signed Prescriptions Disp Refills  . losartan-hydrochlorothiazide (HYZAAR) 100-25 MG tablet 30 tablet 0    Sig: Take 1 tablet by mouth daily.

## 2019-12-23 ENCOUNTER — Ambulatory Visit
Admission: RE | Admit: 2019-12-23 | Discharge: 2019-12-23 | Disposition: A | Discharge status: Home / Self Care | Payer: Medicare Other | Source: Ambulatory Visit | Attending: Neurology | Admitting: Neurology

## 2019-12-23 ENCOUNTER — Other Ambulatory Visit: Payer: Self-pay

## 2019-12-23 DIAGNOSIS — I10 Essential (primary) hypertension: Secondary | ICD-10-CM

## 2019-12-23 DIAGNOSIS — H814 Vertigo of central origin: Secondary | ICD-10-CM | POA: Diagnosis not present

## 2019-12-23 DIAGNOSIS — H8302 Labyrinthitis, left ear: Secondary | ICD-10-CM

## 2019-12-23 DIAGNOSIS — R42 Dizziness and giddiness: Secondary | ICD-10-CM

## 2019-12-23 MED ORDER — GADOBENATE DIMEGLUMINE 529 MG/ML IV SOLN
15.0000 mL | Freq: Once | INTRAVENOUS | Status: AC | PRN
  Administered 2019-12-23: 15 mL via INTRAVENOUS

## 2019-12-23 NOTE — Progress Notes (Signed)
Normal CMET- this patient had a non fast glucose level of 84 mg/dl , normal liver and renal function. OK for MRI with contrast, PS: PCP has to be updated.

## 2019-12-24 ENCOUNTER — Telehealth: Payer: Self-pay | Admitting: Neurology

## 2019-12-24 NOTE — Telephone Encounter (Signed)
Called the patient and reviewed lab work and stated that labs were good. Pt verbalized understanding.

## 2019-12-24 NOTE — Telephone Encounter (Signed)
-----   Message from Larey Seat, MD sent at 12/23/2019 11:38 AM EDT ----- Normal CMET- this patient had a non fast glucose level of 84 mg/dl , normal liver and renal function. OK for MRI with contrast, PS: PCP has to be updated.

## 2019-12-25 ENCOUNTER — Encounter: Payer: Self-pay | Admitting: Neurology

## 2019-12-25 ENCOUNTER — Telehealth: Payer: Self-pay | Admitting: Neurology

## 2019-12-25 NOTE — Telephone Encounter (Signed)
MRI findings just rule out stroke, tumor etc as causes for symptoms, vertigo is a symptom. Vestibular dysfunction is most likely cause.  The cyst has nothing to do with your symptoms, but needs to be noted so that future images can look back and compare size, etc.

## 2019-12-25 NOTE — Progress Notes (Signed)
IMPRESSION:   MRI brain (with and without) demonstrating: - Mild chronic small vessel ischemic disease. - Incidental benign left infraputaminal cyst (1.3cm). - No acute findings.    INTERPRETING PHYSICIAN:  Penni Bombard, MD

## 2019-12-25 NOTE — Telephone Encounter (Signed)
Called the patient to review MRI report with her. We discussed the findings in detail. Patient did have a couple of questions she wanted me to ask Dr Brett Fairy. 1) would this cyst have any affect on her symptoms or is there anything we need to do with this? 2) Based off the report, would Dr Brett Fairy agree final determination to be vertigo? (She is scheduled 9/24 to have the balance test and she wanted to confirm if she should proceed with that)  I informed her I would confirm my answers with Dr Brett Fairy and send her a message. Patient verbalized understanding and was appreciative.

## 2019-12-25 NOTE — Telephone Encounter (Signed)
-----   Message from Larey Seat, MD sent at 12/25/2019  8:58 AM EDT ----- IMPRESSION:   MRI brain (with and without) demonstrating: - Mild chronic small vessel ischemic disease. - Incidental benign left infraputaminal cyst (1.3cm). - No acute findings.    INTERPRETING PHYSICIAN:  Penni Bombard, MD

## 2019-12-30 ENCOUNTER — Other Ambulatory Visit: Payer: Self-pay | Admitting: Internal Medicine

## 2019-12-30 MED ORDER — MECLIZINE HCL 12.5 MG PO TABS: 12.5000 mg | ORAL_TABLET | Freq: Three times a day (TID) | ORAL | 0 refills | Status: DC | PRN

## 2019-12-31 ENCOUNTER — Ambulatory Visit (INDEPENDENT_AMBULATORY_CARE_PROVIDER_SITE_OTHER): Payer: Medicare Other | Admitting: Otolaryngology

## 2020-01-05 NOTE — Patient Instructions (Addendum)
  Blood work was ordered.     Medications reviewed and updated.  Changes include :   none    Follow up with Dr Sharlet Salina.

## 2020-01-05 NOTE — Progress Notes (Signed)
Subjective:    Patient ID: Dana Small, female    DOB: 1945/07/21, 74 y.o.   MRN: [id number]  HPI The patient is here for follow up of her BP   She saw laura on 9/3.  She was having dizziness and saw ENT and neuro.  She did have an MRI.  BP was elevated at neurology visit.  Triam/hctz changed to Production assistant, radio by Mickel Baas.    She is here for follow up.  She is taking all of her medication as prescribed.  She denies side effects.  She has had a little vertigo - like it was coming on.  She is further evaluation of her vertigo with audiology.  She does not checked her BP at home.      Medications and allergies reviewed with patient and updated if appropriate.  Patient Active Problem List   Diagnosis Date Noted  . Chronic bilateral low back pain without sciatica 10/14/2019  . Dizziness 10/14/2019  . Pre-diabetes 10/14/2019  . Obesity 04/25/2019  . Rectal pain 05/17/2018  . Diarrhea 05/17/2018  . Essential hypertension 06/18/2014  . Osteoarthritis 06/18/2014  . Hearing loss in right ear 06/18/2014  . Routine general medical examination at a health care facility 06/18/2014    Current Outpatient Medications on File Prior to Visit  Medication Sig Dispense Refill  . Cholecalciferol (VITAMIN D3 PO) Take by mouth daily.    . Ibuprofen (ADVIL PO) Take by mouth as needed.    Marland Kitchen losartan-hydrochlorothiazide (HYZAAR) 100-25 MG tablet Take 1 tablet by mouth daily. 30 tablet 0  . meclizine (ANTIVERT) 12.5 MG tablet Take 1 tablet (12.5 mg total) by mouth 3 (three) times daily as needed for dizziness. 30 tablet 0  . Multiple Vitamins-Minerals (MULTIVITAMIN ADULT PO) Take by mouth daily.     No current facility-administered medications on file prior to visit.    Past Medical History:  Diagnosis Date  . Arthritis   . Hypertension     Past Surgical History:  Procedure Laterality Date  . COLONOSCOPY  06/11/2013   [facility]  . TOE SURGERY    . tooth implant       Social History   Socioeconomic History  . Marital status: Married    Spouse name: Not on file  . Number of children: Not on file  . Years of education: Not on file  . Highest education level: Not on file  Occupational History  . Not on file  Tobacco Use  . Smoking status: Never Smoker  . Smokeless tobacco: Never Used  Vaping Use  . Vaping Use: Never used  Substance and Sexual Activity  . Alcohol use: Yes  . Drug use: Never  . Sexual activity: Not on file  Other Topics Concern  . Not on file  Social History Narrative  . Not on file   Social Determinants of Health   Financial Resource Strain:   . Difficulty of Paying Living Expenses: Not on file  Food Insecurity:   . Worried About Charity fundraiser in the Last Year: Not on file  . Ran Out of Food in the Last Year: Not on file  Transportation Needs:   . Lack of Transportation (Medical): Not on file  . Lack of Transportation (Non-Medical): Not on file  Physical Activity:   . Days of Exercise per Week: Not on file  . Minutes of Exercise per Session: Not on file  Stress:   . Feeling of Stress : Not on file  Social  Connections:   . Frequency of Communication with Friends and Family: Not on file  . Frequency of Social Gatherings with Friends and Family: Not on file  . Attends Religious Services: Not on file  . Active Member of Clubs or Organizations: Not on file  . Attends Archivist Meetings: Not on file  . Marital Status: Not on file    Family History  Problem Relation Age of Onset  . Heart disease Other        Parents  . Hypertension Other        Parents  . Diverticulitis Father   . Breast cancer Neg Hx   . Colon cancer Neg Hx   . Esophageal cancer Neg Hx   . Cancer Neg Hx   . Stomach cancer Neg Hx   . Rectal cancer Neg Hx     Review of Systems  Constitutional: Negative for fever.  Cardiovascular: Negative for chest pain, palpitations and leg swelling.  Neurological: Positive for  dizziness. Negative for headaches.       Objective:   Vitals:   01/06/20 1105  BP: 130/60  Pulse: 62  Temp: 97.7 F (36.5 C)  SpO2: 96%   BP Readings from Last 3 Encounters:  01/06/20 130/60  12/19/19 (!) 158/70  12/18/19 (!) 190/79   Wt Readings from Last 3 Encounters:  01/06/20 185 lb (83.9 kg)  12/19/19 183 lb (83 kg)  12/18/19 183 lb (83 kg)   Body mass index is 31.76 kg/m.   Physical Exam    Constitutional: Appears well-developed and well-nourished. No distress.  HENT:  Head: Normocephalic and atraumatic.  Neck: Neck supple. No tracheal deviation present. No thyromegaly present.  No cervical lymphadenopathy Cardiovascular: Normal rate, regular rhythm and normal heart sounds.   No murmur heard. No carotid bruit .  No edema Pulmonary/Chest: Effort normal and breath sounds normal. No respiratory distress. No has no wheezes. No rales.  Skin: Skin is warm and dry. Not diaphoretic.  Psychiatric: Normal mood and affect. Behavior is normal.      Assessment & Plan:    See Problem List for Assessment and Plan of chronic medical problems.    This visit occurred during the SARS-CoV-2 public health emergency.  Safety protocols were in place, including screening questions prior to the visit, additional usage of staff PPE, and extensive cleaning of exam room while observing appropriate contact time as indicated for disinfecting solutions.

## 2020-01-06 ENCOUNTER — Other Ambulatory Visit: Payer: Self-pay

## 2020-01-06 ENCOUNTER — Ambulatory Visit (INDEPENDENT_AMBULATORY_CARE_PROVIDER_SITE_OTHER): Payer: Medicare Other | Admitting: Internal Medicine

## 2020-01-06 ENCOUNTER — Encounter: Payer: Self-pay | Admitting: Internal Medicine

## 2020-01-06 VITALS — BP 130/60 | HR 62 | Temp 97.7°F | Ht 64.0 in | Wt 185.0 lb

## 2020-01-06 DIAGNOSIS — I10 Essential (primary) hypertension: Secondary | ICD-10-CM

## 2020-01-06 NOTE — Assessment & Plan Note (Signed)
Chronic Blood pressure well controlled here today Advised her to start monitoring her blood pressure at home Discussed weight loss Continue losartan-hydrochlorothiazide 100-25 mg daily BMP today

## 2020-01-07 LAB — BASIC METABOLIC PANEL WITH GFR
BUN: 20 mg/dL (ref 7–25)
CO2: 32 mmol/L (ref 20–32)
Calcium: 9.8 mg/dL (ref 8.6–10.4)
Chloride: 101 mmol/L (ref 98–110)
Creat: 0.64 mg/dL (ref 0.60–0.93)
GFR, Est African American: 103 mL/min/{1.73_m2} (ref >=60)
GFR, Est Non African American: 89 mL/min/{1.73_m2} (ref >=60)
Glucose, Bld: 97 mg/dL (ref 65–99)
Potassium: 3.7 mmol/L (ref 3.5–5.3)
Sodium: 141 mmol/L (ref 135–146)

## 2020-01-08 ENCOUNTER — Encounter: Payer: Self-pay | Admitting: Internal Medicine

## 2020-01-08 MED ORDER — LOSARTAN POTASSIUM-HCTZ 100-25 MG PO TABS: 1.0000 | ORAL_TABLET | Freq: Every day | ORAL | 0 refills | Status: DC

## 2020-01-20 ENCOUNTER — Other Ambulatory Visit: Payer: Self-pay

## 2020-01-20 ENCOUNTER — Ambulatory Visit (INDEPENDENT_AMBULATORY_CARE_PROVIDER_SITE_OTHER): Payer: Medicare Other

## 2020-01-20 VITALS — BP 150/74 | HR 75

## 2020-01-20 DIAGNOSIS — Z23 Encounter for immunization: Secondary | ICD-10-CM | POA: Diagnosis not present

## 2020-01-21 ENCOUNTER — Ambulatory Visit: Payer: Medicare Other

## 2020-01-21 ENCOUNTER — Ambulatory Visit (INDEPENDENT_AMBULATORY_CARE_PROVIDER_SITE_OTHER): Payer: Medicare Other | Admitting: Otolaryngology

## 2020-02-03 ENCOUNTER — Encounter: Payer: Self-pay | Admitting: Internal Medicine

## 2020-02-03 DIAGNOSIS — R42 Dizziness and giddiness: Secondary | ICD-10-CM | POA: Diagnosis not present

## 2020-02-03 NOTE — Telephone Encounter (Signed)
    Patient wants to know if she needs to take medication today.

## 2020-02-04 MED ORDER — AMLODIPINE BESYLATE 5 MG PO TABS: 5.0000 mg | ORAL_TABLET | Freq: Every day | ORAL | 3 refills | Status: DC

## 2020-02-04 MED ORDER — HYDROCHLOROTHIAZIDE 25 MG PO TABS: 25.0000 mg | ORAL_TABLET | Freq: Every day | ORAL | 3 refills | Status: DC

## 2020-02-04 NOTE — Addendum Note (Signed)
Addended by: Binnie Rail on: 02/04/2020 08:51 PM   Modules accepted: Orders

## 2020-02-11 ENCOUNTER — Telehealth (INDEPENDENT_AMBULATORY_CARE_PROVIDER_SITE_OTHER): Payer: Self-pay

## 2020-02-16 ENCOUNTER — Encounter (INDEPENDENT_AMBULATORY_CARE_PROVIDER_SITE_OTHER): Payer: Self-pay

## 2020-02-19 ENCOUNTER — Ambulatory Visit: Payer: Medicare Other | Admitting: Neurology

## 2020-03-25 ENCOUNTER — Other Ambulatory Visit (HOSPITAL_COMMUNITY): Payer: Self-pay | Admitting: Internal Medicine

## 2020-03-25 MED FILL — SHINGRIX 50 MCG SUS: 50 | 1 days supply | Qty: 1 | Fill #0

## 2020-04-01 ENCOUNTER — Telehealth: Payer: Self-pay | Admitting: Internal Medicine

## 2020-04-01 NOTE — Telephone Encounter (Signed)
   Patient calling to request a referral to specialist  She states whenever she looks downward , vertigo begins. She is seeing a chiropractor for neck pain and he as well mentioned the issue may be nerve related. She would like a referral to see possibly a Neuorologist  Patient seeking advice/ referral

## 2020-04-01 NOTE — Telephone Encounter (Signed)
I think she is overdue for follow up with me. It also appears she has already seen a neurologist for this issue as well as an ENT doctor so we can address further at visit.

## 2020-04-01 NOTE — Telephone Encounter (Signed)
[facility] scheduler to contact pt to schedule OV.

## 2020-04-02 ENCOUNTER — Other Ambulatory Visit: Payer: Self-pay

## 2020-04-02 ENCOUNTER — Ambulatory Visit (INDEPENDENT_AMBULATORY_CARE_PROVIDER_SITE_OTHER): Payer: Medicare Other | Admitting: Internal Medicine

## 2020-04-02 ENCOUNTER — Encounter: Payer: Self-pay | Admitting: Internal Medicine

## 2020-04-02 VITALS — BP 138/72 | HR 82 | Temp 98.5°F | Ht 64.0 in | Wt 182.4 lb

## 2020-04-02 DIAGNOSIS — I10 Essential (primary) hypertension: Secondary | ICD-10-CM | POA: Diagnosis not present

## 2020-04-02 MED ORDER — AMLODIPINE BESYLATE 10 MG PO TABS
10.0000 mg | ORAL_TABLET | Freq: Every day | ORAL | 3 refills | Status: DC
Start: 2020-04-02 — End: 2020-04-13

## 2020-04-02 MED ORDER — PRAVASTATIN SODIUM 20 MG PO TABS: 20.0000 mg | ORAL_TABLET | Freq: Every day | ORAL | 3 refills | Status: DC

## 2020-04-02 NOTE — Progress Notes (Signed)
   Subjective:   Patient ID: Dana Small, female    DOB: 1945/11/13, 74 y.o.   MRN: [id number]  HPI The patient is a 74 YO female coming in for concerns about vertigo. She has seen multiple specialists and primary care for this. She had BP medication adjustment which did help some. Her BP are still running high at home to 130-140s/70-80s. She denies chest pains or headaches. Sometimes she is still getting the dizziness. Mostly if she looks down a certain way or when she was gardening for an hour or more. She denies nausea or vomiting. Not taking anything for it currently. 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   Review of Systems  Constitutional: Negative.   HENT: Negative.   Eyes: Negative.   Respiratory: Negative for cough, chest tightness and shortness of breath.   Cardiovascular: Negative for chest pain, palpitations and leg swelling.  Gastrointestinal: Negative for abdominal distention, abdominal pain, constipation, diarrhea, nausea and vomiting.  Musculoskeletal: Negative.   Skin: Negative.   Neurological: Positive for dizziness.  Psychiatric/Behavioral: Negative.     Objective:  Physical Exam Constitutional:      Appearance: She is well-developed and well-nourished.  HENT:     Head: Normocephalic and atraumatic.  Eyes:     Extraocular Movements: EOM normal.  Cardiovascular:     Rate and Rhythm: Normal rate and regular rhythm.  Pulmonary:     Effort: Pulmonary effort is normal. No respiratory distress.     Breath sounds: Normal breath sounds. No wheezing or rales.  Abdominal:     General: Bowel sounds are normal. There is no distension.     Palpations: Abdomen is soft.     Tenderness: There is no abdominal tenderness. There is no rebound.  Musculoskeletal:        General: No edema.     Cervical back: Normal range of motion.  Skin:    General: Skin is warm and dry.  Neurological:     Mental Status: She is alert and oriented to person, place, and  time.     Coordination: Coordination normal.  Psychiatric:        Mood and Affect: Mood and affect normal.     Vitals:   04/02/20 0905  BP: 138/72  Pulse: 82  Temp: 98.5 F (36.9 C)  Weight: 182 lb 6.4 oz (82.7 kg)  Height: 5\' 4"  (1.626 m)    This visit occurred during the SARS-CoV-2 public health emergency.  Safety protocols were in place, including screening questions prior to the visit, additional usage of staff PPE, and extensive cleaning of exam room while observing appropriate contact time as indicated for disinfecting solutions.   Assessment & Plan:

## 2020-04-02 NOTE — Patient Instructions (Addendum)
We will increase the amlodipine to 10 mg daily. You can take 2 pills of this until you run out and get the new dose when you refill it.   Let us know if you need physical therapy for the neck.   We have sent in pravastatin for the cholesterol to take daily.

## 2020-04-02 NOTE — Assessment & Plan Note (Signed)
Increase amlodipine to 10 mg daily. Keep hctz 25 mg daily. Adding pravastatin for CV risk ~20%.

## 2020-04-09 ENCOUNTER — Other Ambulatory Visit: Payer: Self-pay | Admitting: Internal Medicine

## 2020-04-13 ENCOUNTER — Other Ambulatory Visit: Payer: Self-pay

## 2020-04-13 ENCOUNTER — Ambulatory Visit (INDEPENDENT_AMBULATORY_CARE_PROVIDER_SITE_OTHER): Payer: Medicare Other | Admitting: Internal Medicine

## 2020-04-13 ENCOUNTER — Encounter: Payer: Self-pay | Admitting: Internal Medicine

## 2020-04-13 VITALS — BP 142/82 | HR 74 | Temp 98.4°F | Ht 64.0 in | Wt 186.0 lb

## 2020-04-13 DIAGNOSIS — R7303 Prediabetes: Secondary | ICD-10-CM | POA: Diagnosis not present

## 2020-04-13 DIAGNOSIS — T502X5A Adverse effect of carbonic-anhydrase inhibitors, benzothiadiazides and other diuretics, initial encounter: Secondary | ICD-10-CM

## 2020-04-13 DIAGNOSIS — I6789 Other cerebrovascular disease: Secondary | ICD-10-CM | POA: Diagnosis not present

## 2020-04-13 DIAGNOSIS — G450 Vertebro-basilar artery syndrome: Secondary | ICD-10-CM | POA: Insufficient documentation

## 2020-04-13 DIAGNOSIS — E876 Hypokalemia: Secondary | ICD-10-CM | POA: Diagnosis not present

## 2020-04-13 DIAGNOSIS — I10 Essential (primary) hypertension: Secondary | ICD-10-CM | POA: Diagnosis not present

## 2020-04-13 LAB — BASIC METABOLIC PANEL
BUN: 19 mg/dL (ref 6–23)
CO2: 33 mEq/L — ABNORMAL HIGH (ref 19–32)
Calcium: 9.7 mg/dL (ref 8.4–10.5)
Chloride: 102 mEq/L (ref 96–112)
Creatinine, Ser: 0.66 mg/dL (ref 0.40–1.20)
GFR: 86.56 mL/min (ref >60.00)
Glucose, Bld: 107 mg/dL — ABNORMAL HIGH (ref 70–99)
Potassium: 3.1 mEq/L — ABNORMAL LOW (ref 3.5–5.1)
Sodium: 141 mEq/L (ref 135–145)

## 2020-04-13 LAB — HEMOGLOBIN A1C: Hgb A1c MFr Bld: 5.8 % (ref 4.6–6.5)

## 2020-04-13 MED ORDER — POTASSIUM CHLORIDE CRYS ER 20 MEQ PO TBCR
20.0000 meq | EXTENDED_RELEASE_TABLET | Freq: Three times a day (TID) | ORAL | 0 refills | Status: DC
Start: 2020-04-13 — End: 2020-06-14

## 2020-04-13 MED ORDER — IRBESARTAN 150 MG PO TABS
150.0000 mg | ORAL_TABLET | Freq: Every day | ORAL | 1 refills | Status: DC
Start: 2020-04-13 — End: 2020-10-06

## 2020-04-13 NOTE — Progress Notes (Signed)
Subjective:  Patient ID: Dana Small, female    DOB: 23-Mar-1946  Age: 74 y.o. MRN: [id number]  CC: Hypertension  This visit occurred during the SARS-CoV-2 public health emergency.  Safety protocols were in place, including screening questions prior to the visit, additional usage of staff PPE, and extensive cleaning of exam room while observing appropriate contact time as indicated for disinfecting solutions.   NEW TO ME  HPI Dana Small presents for f/up   1.  She tells me that for the better part of the last year she has been dealing with episodes of dizziness and vertigo.  She has been seen by audiology, ENT, and neurology.  An MRI of the brain showed small vessel disease.  She continues to complain of the sensation of dizziness and vertigo whenever she tilts her head forward.  She feels like something pinches in her upper neck and occipital scalp region.  She says its not a very painful sensation.  She has also developed mild ataxia.  2.  She tells me that about a week ago she increased her amlodipine dose from 5 mg to 10 mg.  Since then she has had redness, discomfort, and swelling around both ankles.  She rarely takes Advil which has not helped.  She also tells me her blood pressure has not been well controlled.  Outpatient Medications Prior to Visit  Medication Sig Dispense Refill  . Cholecalciferol (VITAMIN D3 PO) Take by mouth daily.    . hydrochlorothiazide (HYDRODIURIL) 25 MG tablet Take 1 tablet (25 mg total) by mouth daily. 30 tablet 3  . meclizine (ANTIVERT) 12.5 MG tablet Take 1 tablet (12.5 mg total) by mouth 3 (three) times daily as needed for dizziness. 30 tablet 0  . Multiple Vitamins-Minerals (MULTIVITAMIN ADULT PO) Take by mouth daily.    . pravastatin (PRAVACHOL) 20 MG tablet Take 1 tablet (20 mg total) by mouth daily. 90 tablet 3  . amLODipine (NORVASC) 10 MG tablet Take 1 tablet (10 mg total) by mouth daily. 90 tablet 3  . Ibuprofen (ADVIL PO) Take by mouth as  needed.     No facility-administered medications prior to visit.    ROS Review of Systems  Constitutional: Negative.  Negative for appetite change, diaphoresis, fatigue and unexpected weight change.  HENT: Negative.   Respiratory: Negative.  Negative for cough, chest tightness and shortness of breath.   Cardiovascular: Positive for leg swelling. Negative for chest pain and palpitations.  Gastrointestinal: Negative for abdominal pain, constipation, diarrhea, nausea and vomiting.  Genitourinary: Negative.  Negative for difficulty urinating.  Musculoskeletal: Positive for gait problem. Negative for arthralgias and myalgias.       Mild ataxia  Skin: Negative.  Negative for color change and pallor.  Neurological: Positive for dizziness. Negative for tremors, seizures, syncope, facial asymmetry, speech difficulty, light-headedness, numbness and headaches.  Hematological: Negative for adenopathy. Does not bruise/bleed easily.  Psychiatric/Behavioral: Negative.  The patient is not nervous/anxious.     Objective:  BP (!) 142/82   Pulse 74   Temp 98.4 F (36.9 C) (Oral)   Ht 5\' 4"  (1.626 m)   Wt 186 lb (84.4 kg)   SpO2 97%   BMI 31.93 kg/m   BP Readings from Last 3 Encounters:  04/13/20 (!) 142/82  04/02/20 138/72  01/20/20 (!) 150/74    Wt Readings from Last 3 Encounters:  04/13/20 186 lb (84.4 kg)  04/02/20 182 lb 6.4 oz (82.7 kg)  01/06/20 185 lb (83.9 kg)    Physical  Exam Vitals reviewed.  HENT:     Nose: Nose normal.     Mouth/Throat:     Mouth: Mucous membranes are moist.  Eyes:     General: No scleral icterus.    Extraocular Movements: Extraocular movements intact.     Conjunctiva/sclera: Conjunctivae normal.     Pupils: Pupils are equal, round, and reactive to light.  Cardiovascular:     Rate and Rhythm: Normal rate and regular rhythm.     Heart sounds: No murmur heard.     Comments: Trace pitting bilateral ankle edema with lacy erythema Pulmonary:      Effort: Pulmonary effort is normal.     Breath sounds: No stridor. No wheezing, rhonchi or rales.  Abdominal:     General: Abdomen is flat.     Palpations: There is no mass.     Tenderness: There is no abdominal tenderness.     Hernia: No hernia is present.  Musculoskeletal:        General: No swelling.     Cervical back: Neck supple.     Right lower leg: Edema present.     Left lower leg: Edema present.  Lymphadenopathy:     Cervical: No cervical adenopathy.  Skin:    General: Skin is warm and dry.     Coloration: Skin is not pale.  Neurological:     General: No focal deficit present.     Mental Status: She is alert and oriented to person, place, and time. Mental status is at baseline.     Cranial Nerves: Cranial nerves are intact.     Motor: Motor function is intact. No weakness or seizure activity.     Coordination: Romberg sign positive. Coordination abnormal.     Deep Tendon Reflexes: Reflexes normal.  Psychiatric:        Mood and Affect: Mood normal.        Behavior: Behavior normal.     Lab Results  Component Value Date   WBC 6.8 04/24/2019   HGB 13.7 04/24/2019   HCT 41.4 04/24/2019   PLT 248.0 04/24/2019   GLUCOSE 107 (H) 04/13/2020   CHOL 183 04/24/2019   TRIG 133.0 04/24/2019   HDL 46.70 04/24/2019   LDLCALC 109 (H) 04/24/2019   ALT 20 12/18/2019   AST 19 12/18/2019   NA 141 04/13/2020   K 3.1 (L) 04/13/2020   CL 102 04/13/2020   CREATININE 0.66 04/13/2020   BUN 19 04/13/2020   CO2 33 (H) 04/13/2020   TSH 2.24 04/24/2019   HGBA1C 5.8 04/13/2020    MR BRAIN W WO CONTRAST  Result Date: 12/24/2019 [facility] NEUROIMAGING REPORT STUDY DATE: 12/23/19 PATIENT NAME: Dana Small DOB: 16-Dec-1945 MRN: [id number] ORDERING CLINICIAN: Dohmeier, Asencion Partridge, MD CLINICAL HISTORY: 74 year old female with vertigo. EXAM: MR BRAIN W WO CONTRAST TECHNIQUE: MRI of the brain with and without contrast was obtained utilizing 5 mm axial slices with T1, T2, T2  flair, SWI and diffusion weighted views.  T1 sagittal, T2 coronal and postcontrast views in the axial and coronal plane were obtained. CONTRAST: 49ml multihance COMPARISON: none IMAGING SITE: [facility] [address] (1.5 Tesla MRI)  FINDINGS: No abnormal lesions are seen on diffusion-weighted views to suggest acute ischemia. The cortical sulci, fissures and cisterns are normal in size and appearance. Lateral, third and fourth ventricle are normal in size and appearance. Benign left infraputaminal cyst (1.3cm). No extra-axial fluid collections are seen. No evidence of mass effect or midline shift.  Mild periventricular and subcortical chronic small vessel ischemic disease. No abnormal lesions on post-contrast views. On sagittal views the posterior fossa, pituitary gland and corpus callosum are unremarkable. No evidence of intracranial hemorrhage on SWI views. The orbits and their contents, paranasal sinuses and calvarium are unremarkable.  Intracranial flow voids are present.   MRI brain (with and without) demonstrating: - Mild chronic small vessel ischemic disease. - Incidental benign left infraputaminal cyst (1.3cm). - No acute findings. INTERPRETING PHYSICIAN: Suanne Marker, MD Certified in Neurology, Neurophysiology and Neuroimaging [facility] [address], [address] [place], [place] [address] [phone]    Assessment & Plan:   Rosangela was seen today for hypertension.  Diagnoses and all orders for this visit:  VBI (vertebrobasilar insufficiency)- Her symptoms are suspicious for the VBI.  I recommended that she undergo an MRA of the neck and head. -     MR Angiogram Neck W Wo Contrast; Future -     MR Angiogram Head Wo Contrast; Future  Cerebral microvascular disease- See above. -     MR Angiogram Neck W Wo Contrast; Future -     MR Angiogram Head Wo Contrast; Future  Essential hypertension- Her blood pressure is not well controlled and I think the CCB  has caused ankle edema.  I recommended that she stop taking amlodipine and to start taking an ARB.  HCTZ has caused hypokalemia so I have asked her to start taking a potassium supplement. -     Basic metabolic panel; Future -     irbesartan (AVAPRO) 150 MG tablet; Take 1 tablet (150 mg total) by mouth daily. -     Basic metabolic panel -     potassium chloride SA (KLOR-CON) 20 MEQ tablet; Take 1 tablet (20 mEq total) by mouth 3 (three) times daily.  Pre-diabetes- Her A1c is at 5.8%.  Medical therapy is not indicated. -     Basic metabolic panel; Future -     Hemoglobin A1c; Future -     Hemoglobin A1c -     Basic metabolic panel  Diuretic-induced hypokalemia -     potassium chloride SA (KLOR-CON) 20 MEQ tablet; Take 1 tablet (20 mEq total) by mouth 3 (three) times daily.   I have discontinued Kittie Hinchey's Ibuprofen (ADVIL PO) and amLODipine. I am also having her start on irbesartan and potassium chloride SA. Additionally, I am having her maintain her Multiple Vitamins-Minerals (MULTIVITAMIN ADULT PO), Cholecalciferol (VITAMIN D3 PO), meclizine, hydrochlorothiazide, and pravastatin.  Meds ordered this encounter  Medications  . irbesartan (AVAPRO) 150 MG tablet    Sig: Take 1 tablet (150 mg total) by mouth daily.    Dispense:  90 tablet    Refill:  1  . potassium chloride SA (KLOR-CON) 20 MEQ tablet    Sig: Take 1 tablet (20 mEq total) by mouth 3 (three) times daily.    Dispense:  270 tablet    Refill:  0   I spent 50 minutes in preparing to see the patient by review of recent labs, imaging and procedures, obtaining and reviewing separately obtained history, communicating with the patient and family or caregiver, ordering medications, tests or procedures, and documenting clinical information in the EHR including the differential Dx, treatment, and any further evaluation and other management of 1. VBI (vertebrobasilar insufficiency) 2. Cerebral microvascular disease 3. Essential  hypertension 4. Pre-diabetes 5. Diuretic-induced hypokalemia     Follow-up: Return in about 3 months (around 07/12/2020).  Sanda Linger, MD

## 2020-04-13 NOTE — Patient Instructions (Signed)

## 2020-05-04 ENCOUNTER — Encounter: Payer: Self-pay | Admitting: Internal Medicine

## 2020-05-04 ENCOUNTER — Other Ambulatory Visit: Payer: Self-pay | Admitting: Internal Medicine

## 2020-05-04 MED ORDER — DIAZEPAM 2 MG PO TABS: 6.0000 mg | ORAL_TABLET | Freq: Once | ORAL | 0 refills | Status: AC

## 2020-05-05 ENCOUNTER — Ambulatory Visit (INDEPENDENT_AMBULATORY_CARE_PROVIDER_SITE_OTHER): Payer: Medicare Other

## 2020-05-05 ENCOUNTER — Other Ambulatory Visit: Payer: Self-pay

## 2020-05-05 VITALS — Ht 64.0 in | Wt 182.4 lb

## 2020-05-05 DIAGNOSIS — Z0000 Encounter for general adult medical examination without abnormal findings: Secondary | ICD-10-CM

## 2020-05-05 NOTE — Progress Notes (Addendum)
Subjective:   Dana Small is a 75 y.o. female who presents for Medicare Annual (Subsequent) preventive examination.  Review of Systems    No ROS. Medicare Wellness Visit. Additional risk factors are reflected in social history. Cardiac Risk Factors include: advanced age (>107men, >63 women);hypertension;dyslipidemia;family history of premature cardiovascular disease     Objective:    Today's Vitals   05/05/20 1333  Weight: 182 lb 6.4 oz (82.7 kg)  Height: 5\' 4"  (1.626 m)   Body mass index is 31.31 kg/m.  Advanced Directives 05/05/2020 07/03/2014  Does Patient Have a Medical Advance Directive? Yes No  Type of Paramedic of [place];Living will -  Does patient want to make changes to medical advance directive? No - Patient declined -  Copy of [place] in Chart? No - copy requested -  Would patient like information on creating a medical advance directive? - Yes - Educational materials given    Current Medications (verified) Outpatient Encounter Medications as of 05/05/2020  Medication Sig  . Cholecalciferol (VITAMIN D3 PO) Take by mouth daily.  . hydrochlorothiazide (HYDRODIURIL) 25 MG tablet Take 1 tablet (25 mg total) by mouth daily.  . irbesartan (AVAPRO) 150 MG tablet Take 1 tablet (150 mg total) by mouth daily.  . meclizine (ANTIVERT) 12.5 MG tablet Take 1 tablet (12.5 mg total) by mouth 3 (three) times daily as needed for dizziness.  . Multiple Vitamins-Minerals (MULTIVITAMIN ADULT PO) Take by mouth daily.  . potassium chloride SA (KLOR-CON) 20 MEQ tablet Take 1 tablet (20 mEq total) by mouth 3 (three) times daily.  . pravastatin (PRAVACHOL) 20 MG tablet Take 1 tablet (20 mg total) by mouth daily.   No facility-administered encounter medications on file as of 05/05/2020.    Allergies (verified) Amlodipine   History: Past Medical History:  Diagnosis Date  . Arthritis   . Hypertension    Past Surgical History:   Procedure Laterality Date  . COLONOSCOPY  06/11/2013   [facility]  . TOE SURGERY    . tooth implant     Family History  Problem Relation Age of Onset  . Heart disease Other        Parents  . Hypertension Other        Parents  . Diverticulitis Father   . Breast cancer Neg Hx   . Colon cancer Neg Hx   . Esophageal cancer Neg Hx   . Cancer Neg Hx   . Stomach cancer Neg Hx   . Rectal cancer Neg Hx    Social History   Socioeconomic History  . Marital status: Married    Spouse name: Not on file  . Number of children: Not on file  . Years of education: Not on file  . Highest education level: Not on file  Occupational History  . Not on file  Tobacco Use  . Smoking status: Never Smoker  . Smokeless tobacco: Never Used  Vaping Use  . Vaping Use: Never used  Substance and Sexual Activity  . Alcohol use: Yes  . Drug use: Never  . Sexual activity: Not on file  Other Topics Concern  . Not on file  Social History Narrative  . Not on file   Social Determinants of Health   Financial Resource Strain: Low Risk   . Difficulty of Paying Living Expenses: Not hard at all  Food Insecurity: No Food Insecurity  . Worried About Charity fundraiser in the Last Year: Never true  .  Ran Out of Food in the Last Year: Never true  Transportation Needs: No Transportation Needs  . Lack of Transportation (Medical): No  . Lack of Transportation (Non-Medical): No  Physical Activity: Sufficiently Active  . Days of Exercise per Week: 5 days  . Minutes of Exercise per Session: 30 min  Stress: No Stress Concern Present  . Feeling of Stress : Not at all  Social Connections: Socially Integrated  . Frequency of Communication with Friends and Family: More than three times a week  . Frequency of Social Gatherings with Friends and Family: Once a week  . Attends Religious Services: More than 4 times per year  . Active Member of Clubs or Organizations: Yes  . Attends Archivist  Meetings: More than 4 times per year  . Marital Status: Married    Tobacco Counseling Counseling given: Not Answered   Clinical Intake:  Pre-visit preparation completed: Yes  Pain : No/denies pain     Nutritional Risks: None Diabetes: No  How often do you need to have someone help you when you read instructions, pamphlets, or other written materials from your doctor or pharmacy?: 1 - Never What is the last grade level you completed in school?: College Education  Diabetic? no  Interpreter Needed?: No  Information entered by :: Lisette Abu, LPN   Activities of Daily Living In your present state of health, do you have any difficulty performing the following activities: 05/05/2020 01/06/2020  Hearing? Y N  Vision? N N  Difficulty concentrating or making decisions? N N  Walking or climbing stairs? N N  Dressing or bathing? N N  Doing errands, shopping? N N  Preparing Food and eating ? N -  Using the Toilet? N -  In the past six months, have you accidently leaked urine? N -  Do you have problems with loss of bowel control? N -  Managing your Medications? N -  Managing your Finances? N -  Housekeeping or managing your Housekeeping? N -  Some recent data might be hidden    Patient Care Team: Hoyt Koch, MD as PCP - General (Internal Medicine) Opthamology, [facility] as Consulting Physician (Ophthalmology)  Indicate any recent Medical Services you may have received from other than [facility] providers in the past year (date may be approximate).     Assessment:   This is a routine wellness examination for Jessina.  Hearing/Vision screen No exam data present  Dietary issues and exercise activities discussed: Current Exercise Habits: Home exercise routine;Structured exercise class, Type of exercise: strength training/weights;stretching;treadmill;walking;Other - see comments (water aerobics), Time (Minutes): 30, Frequency (Times/Week): 5, Weekly Exercise  (Minutes/Week): 150, Intensity: Moderate, Exercise limited by: None identified  Goals    .  Patient Stated (pt-stated)      My goal is to lose 30 pounds and continue to be active and independent.      Depression Screen PHQ 2/9 Scores 05/05/2020 04/02/2020 04/24/2019 10/23/2017 10/11/2016 10/11/2015 07/03/2014  PHQ - 2 Score 2 2 0 0 0 0 0    Fall Risk Fall Risk  05/05/2020 04/02/2020 01/06/2020 03/11/2019 10/23/2017  Falls in the past year? 0 0 0 0 No  Comment - - - Emmi Telephone Survey: data to providers prior to load -  Number falls in past yr: 0 0 0 - -  Injury with Fall? 0 0 0 - -  Risk for fall due to : No Fall Risks - No Fall Risks - -  Follow up Falls evaluation completed -  Falls evaluation completed - -    FALL RISK PREVENTION PERTAINING TO THE HOME:  Any stairs in or around the home? Yes  If so, are there any without handrails? No  Home free of loose throw rugs in walkways, pet beds, electrical cords, etc? Yes  Adequate lighting in your home to reduce risk of falls? Yes   ASSISTIVE DEVICES UTILIZED TO PREVENT FALLS:  Life alert? No  Use of a cane, walker or w/c? No  Grab bars in the bathroom? Yes  Shower chair or bench in shower? Yes  Elevated toilet seat or a handicapped toilet? No   TIMED UP AND GO:  Was the test performed? No .  Length of time to ambulate 10 feet: 0 sec.   Gait steady and fast without use of assistive device  Cognitive Function: Normal cognitive status assessed by direct observation by this Nurse Health Advisor. No abnormalities found.          Immunizations Immunization History  Administered Date(s) Administered  . Fluad Quad(high Dose 65+) 12/11/2018, 01/20/2020  . Influenza, High Dose Seasonal PF 01/11/2016, 01/01/2017, 01/24/2018  . Influenza-Unspecified 01/15/2014  . Moderna Sars-Covid-2 Vaccination 05/13/2019, 06/10/2019  . Pneumococcal Polysaccharide-23 10/11/2015  . Tdap 03/20/2016  . Zoster Recombinat (Shingrix) 03/25/2020     TDAP status: Up to date  Flu Vaccine status: Up to date  Pneumococcal vaccine status: Due, Education has been provided regarding the importance of this vaccine. Advised may receive this vaccine at local pharmacy or Health Dept. Aware to provide a copy of the vaccination record if obtained from local pharmacy or Health Dept. Verbalized acceptance and understanding.  Covid-19 vaccine status: Completed vaccines  Qualifies for Shingles Vaccine? Yes   Zostavax completed Yes   Shingrix Completed?: No.    Education has been provided regarding the importance of this vaccine. Patient has been advised to call insurance company to determine out of pocket expense if they have not yet received this vaccine. Advised may also receive vaccine at local pharmacy or Health Dept. Verbalized acceptance and understanding.  Screening Tests Health Maintenance  Topic Date Due  . Hepatitis C Screening  Never done  . PNA vac Low Risk Adult (2 of 2 - PCV13) 10/10/2016  . COVID-19 Vaccine (3 - Booster for Moderna series) 12/08/2019  . MAMMOGRAM  04/15/2021  . COLONOSCOPY (Pts 45-24yrs Insurance coverage will need to be confirmed)  02/17/2024  . TETANUS/TDAP  03/20/2026  . INFLUENZA VACCINE  Completed  . DEXA SCAN  Completed    Health Maintenance  Health Maintenance Due  Topic Date Due  . Hepatitis C Screening  Never done  . PNA vac Low Risk Adult (2 of 2 - PCV13) 10/10/2016  . COVID-19 Vaccine (3 - Booster for Moderna series) 12/08/2019    Colorectal cancer screening: Type of screening: Colonoscopy. Completed 02/17/2019. Repeat every 5 years  Mammogram status: Completed 04/16/2019. Repeat every year  Bone Density status: Completed 07/29/2019. Results reflect: Bone density results: OSTEOPENIA. Repeat every 2 years.  Lung Cancer Screening: (Low Dose CT Chest recommended if Age 44-80 years, 30 pack-year currently smoking OR have quit w/in 15years.) does not qualify.   Lung Cancer Screening Referral:  no  Additional Screening:  Hepatitis C Screening: does qualify; Completed no  Vision Screening: Recommended annual ophthalmology exams for early detection of glaucoma and other disorders of the eye. Is the patient up to date with their annual eye exam?  Yes  Who is the provider or what is the name of  the office in which the patient attends annual eye exams? [facility]  If pt is not established with a provider, would they like to be referred to a provider to establish care? No .   Dental Screening: Recommended annual dental exams for proper oral hygiene  Community Resource Referral / Chronic Care Management: CRR required this visit?  No   CCM required this visit?  No      Plan:     I have personally reviewed and noted the following in the patient's chart:   . Medical and social history . Use of alcohol, tobacco or illicit drugs  . Current medications and supplements . Functional ability and status . Nutritional status . Physical activity . Advanced directives . List of other physicians . Hospitalizations, surgeries, and ER visits in previous 12 months . Vitals . Screenings to include cognitive, depression, and falls . Referrals and appointments  In addition, I have reviewed and discussed with patient certain preventive protocols, quality metrics, and best practice recommendations. A written personalized care plan for preventive services as well as general preventive health recommendations were provided to patient.     Sheral Flow, LPN   [id number]   Nurse Notes:  There were no vitals filed for this visit.

## 2020-05-05 NOTE — Patient Instructions (Signed)
Ms. Pawlicki , Thank you for taking time to come for your Medicare Wellness Visit. I appreciate your ongoing commitment to your health goals. Please review the following plan we discussed and let me know if I can assist you in the future.   Screening recommendations/referrals: Colonoscopy: 02/17/2019; due every 5 years Mammogram: 04/16/2019; due every 1-2 years Bone Density: 07/29/2019; due every 2 years Recommended yearly ophthalmology/optometry visit for glaucoma screening and checkup Recommended yearly dental visit for hygiene and checkup  Vaccinations: Influenza vaccine: 01/20/2020 Pneumococcal vaccine: need Prevnar 13 Tdap vaccine: 03/20/2016; due every 10 years Shingles vaccine: need second dose  Covid-19: up to date  Advanced directives: Please bring a copy of your health care power of attorney and living will to the office at your convenience.  Conditions/risks identified: Yes; Reviewed health maintenance screenings with patient today and relevant education, vaccines, and/or referrals were provided. Please continue to do your personal lifestyle choices by: daily care of teeth and gums, regular physical activity (goal should be 5 days a week for 30 minutes), eat a healthy diet, avoid tobacco and drug use, limiting any alcohol intake, taking a low-dose aspirin (if not allergic or have been advised by your provider otherwise) and taking vitamins and minerals as recommended by your provider. Continue doing brain stimulating activities (puzzles, reading, adult coloring books, staying active) to keep memory sharp. Continue to eat heart healthy diet (full of fruits, vegetables, whole grains, lean protein, water--limit salt, fat, and sugar intake) and increase physical activity as tolerated.  Next appointment: Please schedule your next Medicare Wellness Visit with your Nurse Health Advisor in 1 year by calling [phone].   Preventive Care 28 Years and Older, Female Preventive care refers to  lifestyle choices and visits with your health care provider that can promote health and wellness. What does preventive care include?  A yearly physical exam. This is also called an annual well check.  Dental exams once or twice a year.  Routine eye exams. Ask your health care provider how often you should have your eyes checked.  Personal lifestyle choices, including:  Daily care of your teeth and gums.  Regular physical activity.  Eating a healthy diet.  Avoiding tobacco and drug use.  Limiting alcohol use.  Practicing safe sex.  Taking low-dose aspirin every day.  Taking vitamin and mineral supplements as recommended by your health care provider. What happens during an annual well check? The services and screenings done by your health care provider during your annual well check will depend on your age, overall health, lifestyle risk factors, and family history of disease. Counseling  Your health care provider may ask you questions about your:  Alcohol use.  Tobacco use.  Drug use.  Emotional well-being.  Home and relationship well-being.  Sexual activity.  Eating habits.  History of falls.  Memory and ability to understand (cognition).  Work and work Statistician.  Reproductive health. Screening  You may have the following tests or measurements:  Height, weight, and BMI.  Blood pressure.  Lipid and cholesterol levels. These may be checked every 5 years, or more frequently if you are over 60 years old.  Skin check.  Lung cancer screening. You may have this screening every year starting at age 41 if you have a 30-pack-year history of smoking and currently smoke or have quit within the past 15 years.  Fecal occult blood test (FOBT) of the stool. You may have this test every year starting at age 11.  Flexible sigmoidoscopy or  colonoscopy. You may have a sigmoidoscopy every 5 years or a colonoscopy every 10 years starting at age 48.  Hepatitis C blood  test.  Hepatitis B blood test.  Sexually transmitted disease (STD) testing.  Diabetes screening. This is done by checking your blood sugar (glucose) after you have not eaten for a while (fasting). You may have this done every 1-3 years.  Bone density scan. This is done to screen for osteoporosis. You may have this done starting at age 39.  Mammogram. This may be done every 1-2 years. Talk to your health care provider about how often you should have regular mammograms. Talk with your health care provider about your test results, treatment options, and if necessary, the need for more tests. Vaccines  Your health care provider may recommend certain vaccines, such as:  Influenza vaccine. This is recommended every year.  Tetanus, diphtheria, and acellular pertussis (Tdap, Td) vaccine. You may need a Td booster every 10 years.  Zoster vaccine. You may need this after age 80.  Pneumococcal 13-valent conjugate (PCV13) vaccine. One dose is recommended after age 50.  Pneumococcal polysaccharide (PPSV23) vaccine. One dose is recommended after age 18. Talk to your health care provider about which screenings and vaccines you need and how often you need them. This information is not intended to replace advice given to you by your health care provider. Make sure you discuss any questions you have with your health care provider. Document Released: 04/30/2015 Document Revised: 12/22/2015 Document Reviewed: 02/02/2015 Elsevier Interactive Patient Education  2017 [place] Prevention in the Home Falls can cause injuries. They can happen to people of all ages. There are many things you can do to make your home safe and to help prevent falls. What can I do on the outside of my home?  Regularly fix the edges of walkways and driveways and fix any cracks.  Remove anything that might make you trip as you walk through a door, such as a raised step or threshold.  Trim any bushes or trees on the  path to your home.  Use bright outdoor lighting.  Clear any walking paths of anything that might make someone trip, such as rocks or tools.  Regularly check to see if handrails are loose or broken. Make sure that both sides of any steps have handrails.  Any raised decks and porches should have guardrails on the edges.  Have any leaves, snow, or ice cleared regularly.  Use sand or salt on walking paths during winter.  Clean up any spills in your garage right away. This includes oil or grease spills. What can I do in the bathroom?  Use night lights.  Install grab bars by the toilet and in the tub and shower. Do not use towel bars as grab bars.  Use non-skid mats or decals in the tub or shower.  If you need to sit down in the shower, use a plastic, non-slip stool.  Keep the floor dry. Clean up any water that spills on the floor as soon as it happens.  Remove soap buildup in the tub or shower regularly.  Attach bath mats securely with double-sided non-slip rug tape.  Do not have throw rugs and other things on the floor that can make you trip. What can I do in the bedroom?  Use night lights.  Make sure that you have a light by your bed that is easy to reach.  Do not use any sheets or blankets that are too  big for your bed. They should not hang down onto the floor.  Have a firm chair that has side arms. You can use this for support while you get dressed.  Do not have throw rugs and other things on the floor that can make you trip. What can I do in the kitchen?  Clean up any spills right away.  Avoid walking on wet floors.  Keep items that you use a lot in easy-to-reach places.  If you need to reach something above you, use a strong step stool that has a grab bar.  Keep electrical cords out of the way.  Do not use floor polish or wax that makes floors slippery. If you must use wax, use non-skid floor wax.  Do not have throw rugs and other things on the floor that can  make you trip. What can I do with my stairs?  Do not leave any items on the stairs.  Make sure that there are handrails on both sides of the stairs and use them. Fix handrails that are broken or loose. Make sure that handrails are as long as the stairways.  Check any carpeting to make sure that it is firmly attached to the stairs. Fix any carpet that is loose or worn.  Avoid having throw rugs at the top or bottom of the stairs. If you do have throw rugs, attach them to the floor with carpet tape.  Make sure that you have a light switch at the top of the stairs and the bottom of the stairs. If you do not have them, ask someone to add them for you. What else can I do to help prevent falls?  Wear shoes that:  Do not have high heels.  Have rubber bottoms.  Are comfortable and fit you well.  Are closed at the toe. Do not wear sandals.  If you use a stepladder:  Make sure that it is fully opened. Do not climb a closed stepladder.  Make sure that both sides of the stepladder are locked into place.  Ask someone to hold it for you, if possible.  Clearly mark and make sure that you can see:  Any grab bars or handrails.  First and last steps.  Where the edge of each step is.  Use tools that help you move around (mobility aids) if they are needed. These include:  Canes.  Walkers.  Scooters.  Crutches.  Turn on the lights when you go into a dark area. Replace any light bulbs as soon as they burn out.  Set up your furniture so you have a clear path. Avoid moving your furniture around.  If any of your floors are uneven, fix them.  If there are any pets around you, be aware of where they are.  Review your medicines with your doctor. Some medicines can make you feel dizzy. This can increase your chance of falling. Ask your doctor what other things that you can do to help prevent falls. This information is not intended to replace advice given to you by your health care  provider. Make sure you discuss any questions you have with your health care provider. Document Released: 01/28/2009 Document Revised: 09/09/2015 Document Reviewed: 05/08/2014 Elsevier Interactive Patient Education  2017 [employer].

## 2020-05-08 ENCOUNTER — Other Ambulatory Visit: Payer: Medicare Other

## 2020-05-25 ENCOUNTER — Ambulatory Visit
Admission: RE | Admit: 2020-05-25 | Discharge: 2020-05-25 | Disposition: A | Discharge status: Home / Self Care | Payer: Medicare Other | Source: Ambulatory Visit | Attending: Internal Medicine | Admitting: Internal Medicine

## 2020-05-25 ENCOUNTER — Other Ambulatory Visit: Payer: Self-pay

## 2020-05-25 DIAGNOSIS — G450 Vertebro-basilar artery syndrome: Secondary | ICD-10-CM

## 2020-05-25 DIAGNOSIS — I6789 Other cerebrovascular disease: Secondary | ICD-10-CM

## 2020-05-25 DIAGNOSIS — I6602 Occlusion and stenosis of left middle cerebral artery: Secondary | ICD-10-CM | POA: Diagnosis not present

## 2020-05-25 DIAGNOSIS — R42 Dizziness and giddiness: Secondary | ICD-10-CM | POA: Diagnosis not present

## 2020-05-25 DIAGNOSIS — I6622 Occlusion and stenosis of left posterior cerebral artery: Secondary | ICD-10-CM | POA: Diagnosis not present

## 2020-05-25 DIAGNOSIS — I6502 Occlusion and stenosis of left vertebral artery: Secondary | ICD-10-CM | POA: Diagnosis not present

## 2020-05-25 IMAGING — MR MR MRA NECK WO/W CM
2 series · 48 of 48 positions shown · IV contrast (8 ML GADAVIST)
Comparison: None.

CLINICAL DATA: Dizziness, vertigo and head tilt.

EXAM:
MRA NECK WITHOUT AND WITH CONTRAST
TECHNIQUE: Multiplanar and multiecho pulse sequences of the neck were obtained
without and with intravenous contrast. Angiographic images of the
neck were obtained using MRA technique without and with intravenous
contrast.
CONTRAST:  8mL GADAVIST GADOBUTROL 1 MMOL/ML IV SOLN

[Series 5: (id) · axial · 3.0mm · 0.98mm/px · z∈[-158,-59]mm · 16 of 50 slices shown]
[im 1/50]
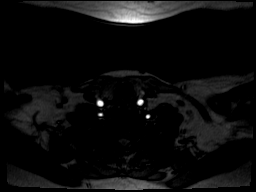
[im 4/50]
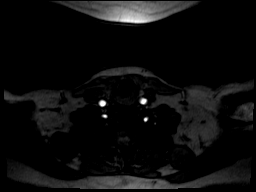
[im 7/50]
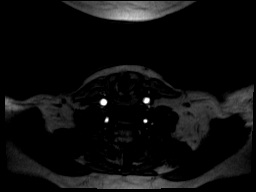
[im 10/50]
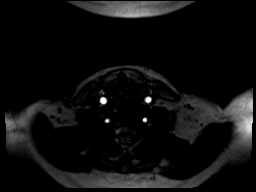
[im 14/50]
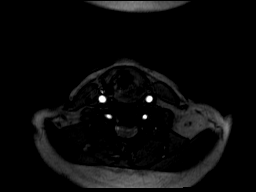
[im 17/50]
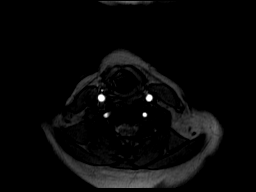
[im 20/50]
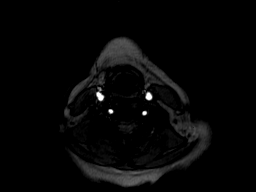
[im 23/50]
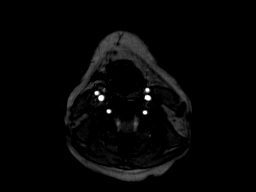
[im 27/50]
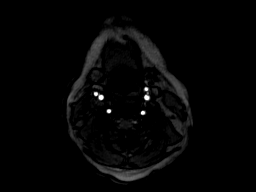
[im 30/50]
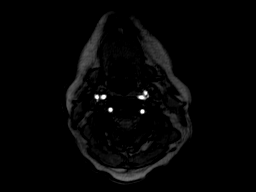
[im 33/50]
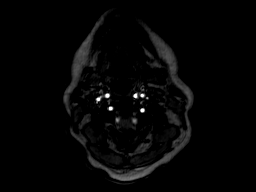
[im 36/50]
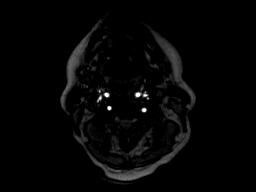
[im 40/50]
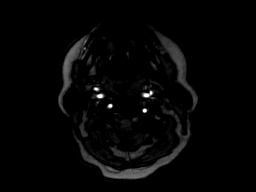
[im 43/50]
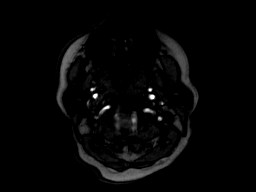
[im 46/50]
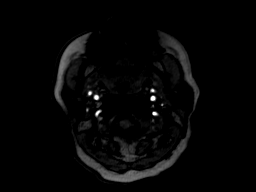
[im 50/50]
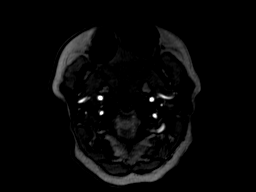

[Series 11: (id)_tt=1.0s · coronal · 0.8mm · 0.78mm/px · 32 of 101 slices shown]
[im 1/101]
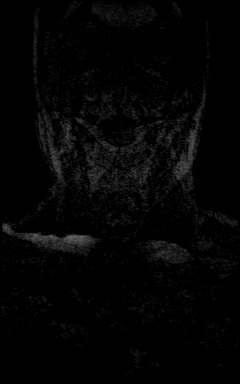
[im 4/101]
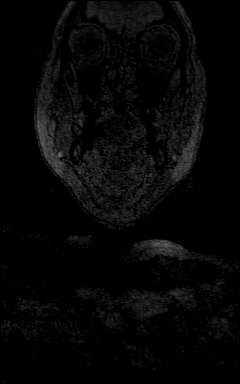
[im 7/101]
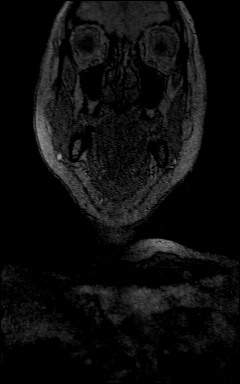
[im 10/101]
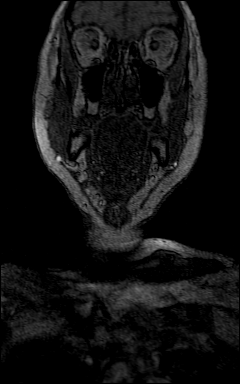
[im 13/101]
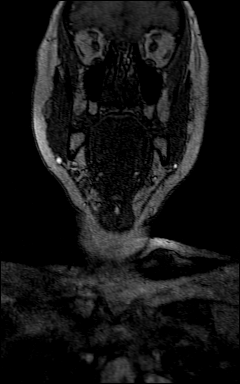
[im 17/101]
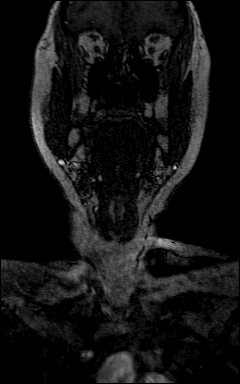
[im 20/101]
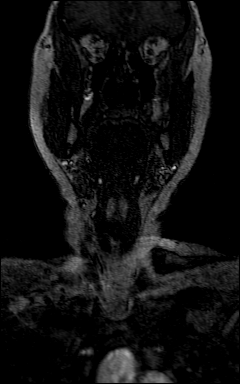
[im 23/101]
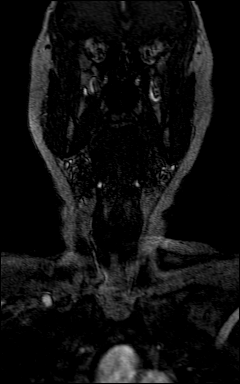
[im 26/101]
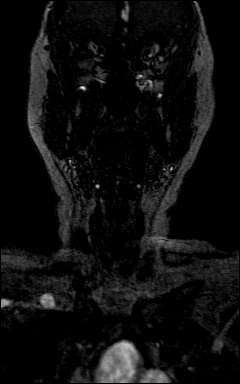
[im 30/101]
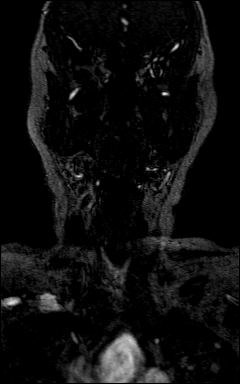
[im 33/101]
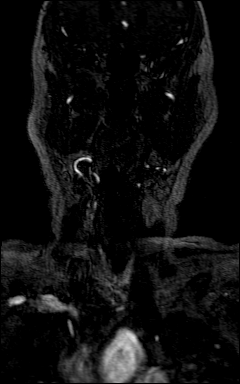
[im 36/101]
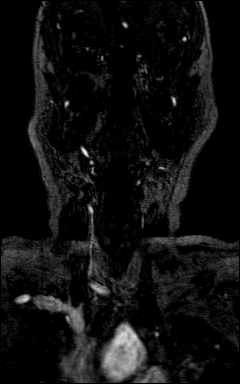
[im 39/101]
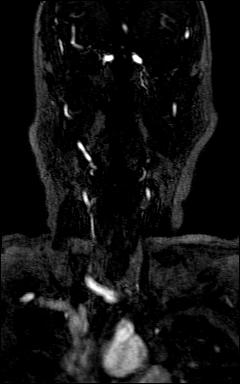
[im 42/101]
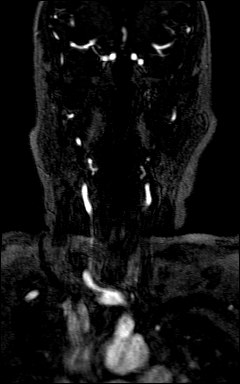
[im 46/101]
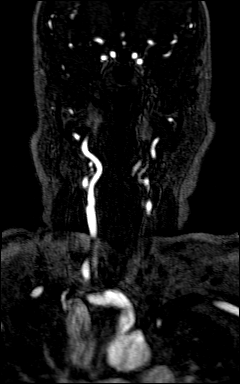
[im 49/101]
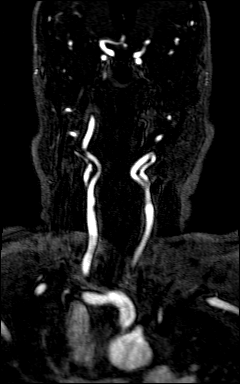
[im 52/101]
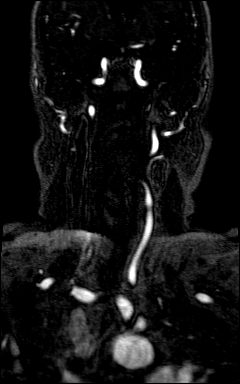
[im 55/101]
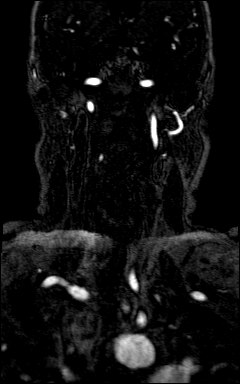
[im 59/101]
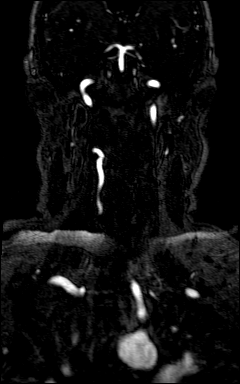
[im 62/101]
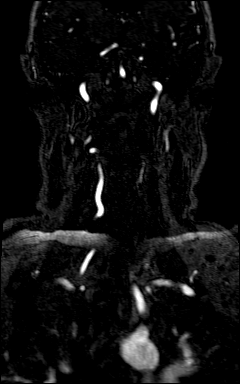
[im 65/101]
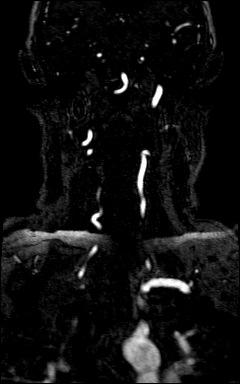
[im 68/101]
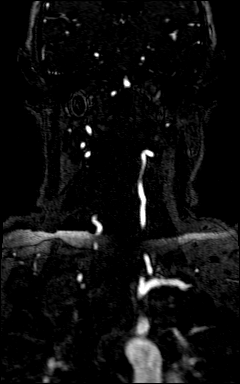
[im 71/101]
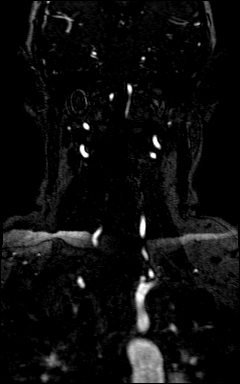
[im 75/101]
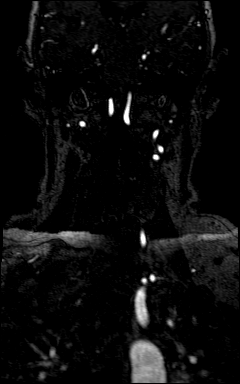
[im 78/101]
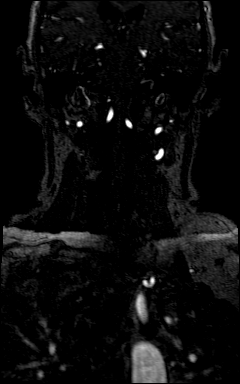
[im 81/101]
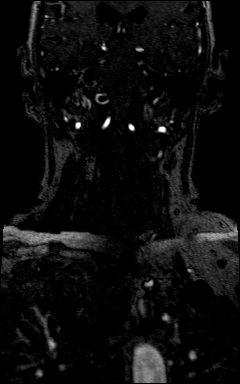
[im 84/101]
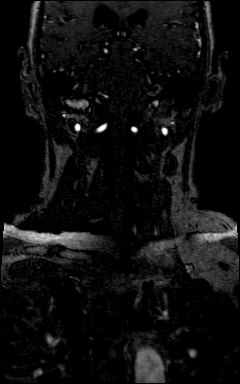
[im 88/101]
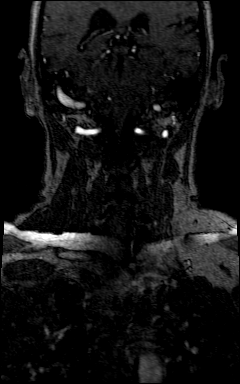
[im 91/101]
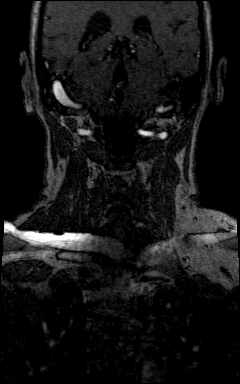
[im 94/101]
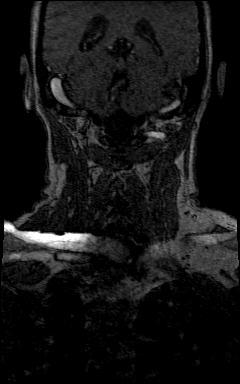
[im 97/101]
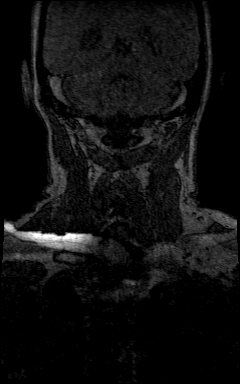
[im 101/101]
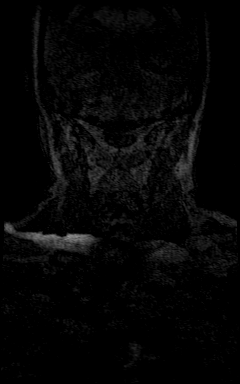

[48 of 48 positions shown; findings below may reference images not displayed]

FINDINGS: Great vessel origins are patent.

Bilateral carotid arteries are patent without evidence of
hemodynamically significant stenosis. Mild narrowing at the carotid
bifurcations without evidence of greater than 50% narrowing.

Codominant vertebral arteries, which are tortuous but without
evidence of hemodynamically significant stenosis. Mild narrowing of
the proximal left vertebral artery without evidence of greater than
50% narrowing.
IMPRESSION: No evidence of hemodynamically significant stenosis in the neck.
Mild proximal left vertebral artery stenosis.

## 2020-05-25 IMAGING — MR MR MRA HEAD W/O CM
1 series · 23 of 48 positions shown · non-contrast
Comparison: Brain MRI [DATE].

CLINICAL DATA: Vertebrobasilar insufficiency. Cerebral
microvascular disease. Dizziness, nonspecific. Additional history
provided by scanning technologist: Patient reports dizziness,
vertigo with head tilt forward, history of small vessel disease,
history of hypertension.

EXAM:
MRA HEAD WITHOUT CONTRAST
TECHNIQUE: Angiographic images of the Circle of Willis were obtained using MRA
technique without intravenous contrast.

[Series 3: tof_3d_multi-slab new · axial · 0.7mm · 0.35mm/px · z∈[-53,+67]mm · 23 of 182 slices shown]
[im 1/182]
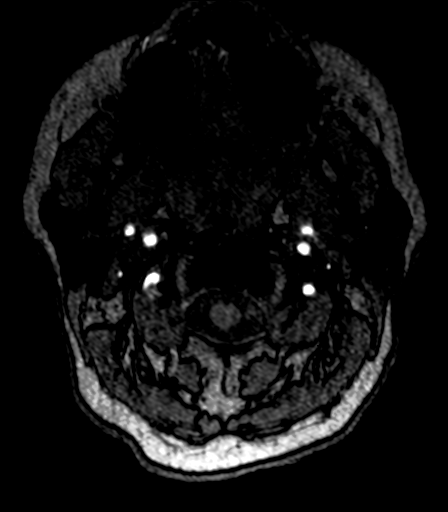
[im 4/182]
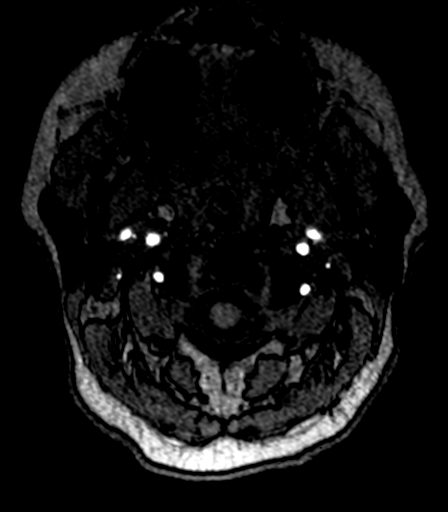
[im 8/182]
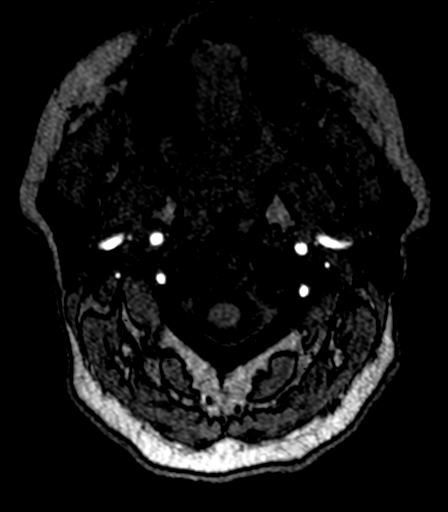
[im 12/182]
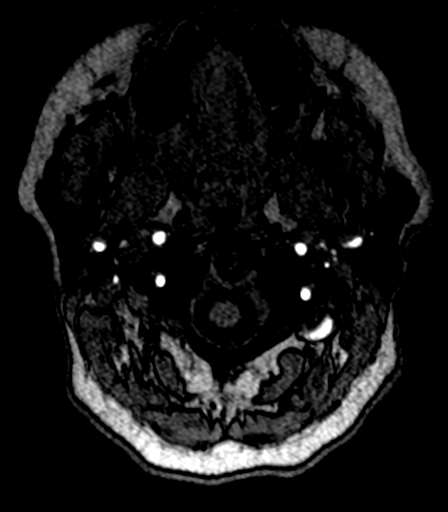
[im 16/182]
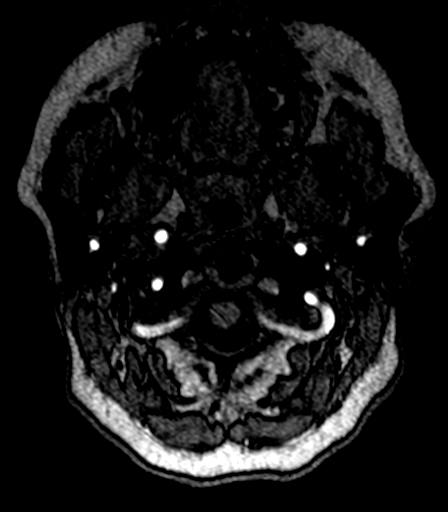
[im 20/182]
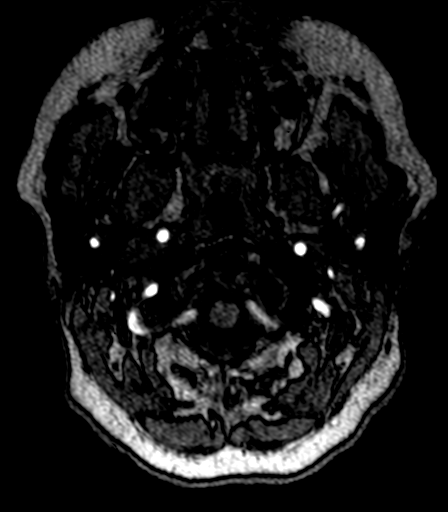
[im 24/182]
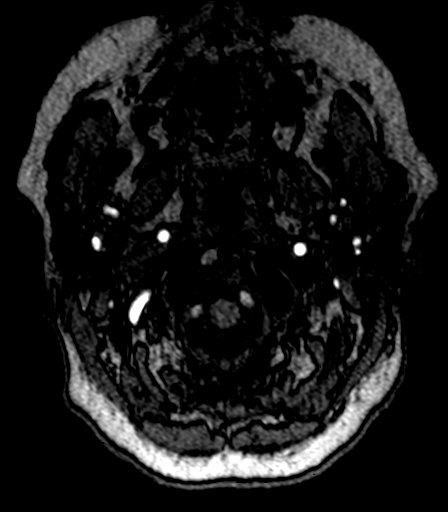
[im 27/182]
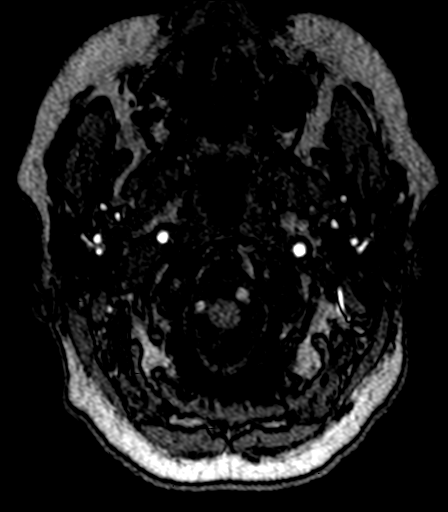
[im 31/182]
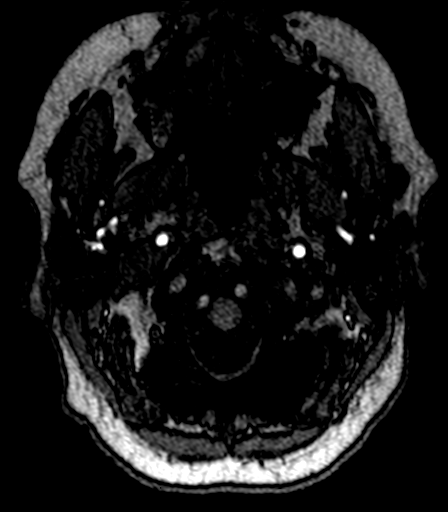
[im 35/182]
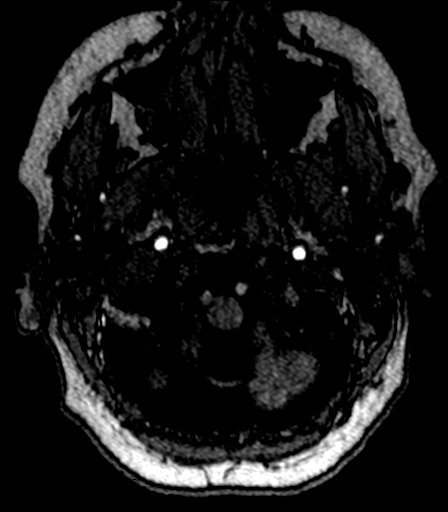
[im 39/182]
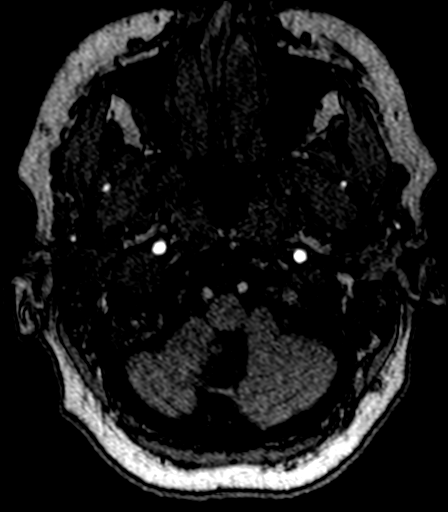
[im 43/182]
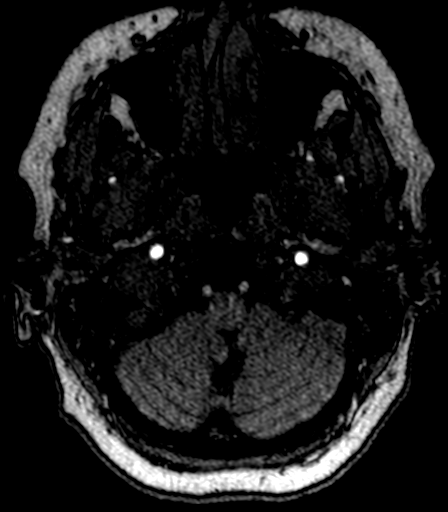
[im 47/182]
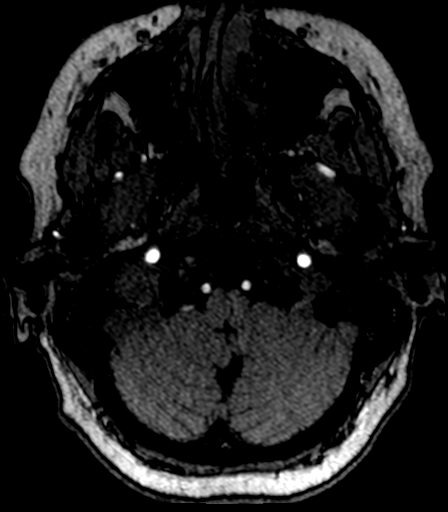
[im 51/182]
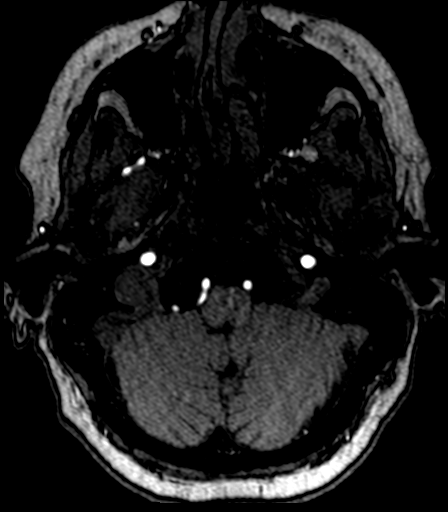
[im 54/182]
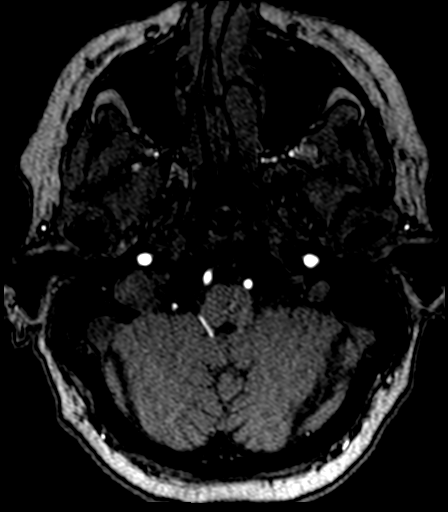
[im 58/182]
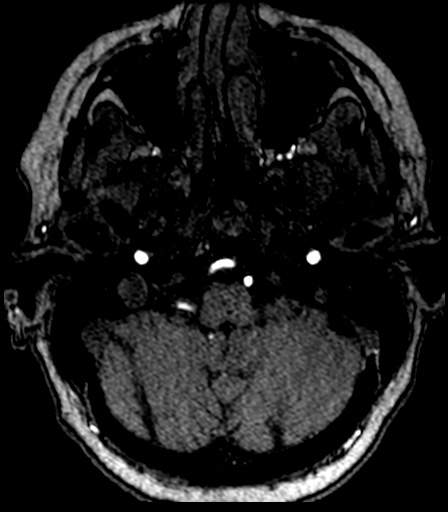
[im 81/182]
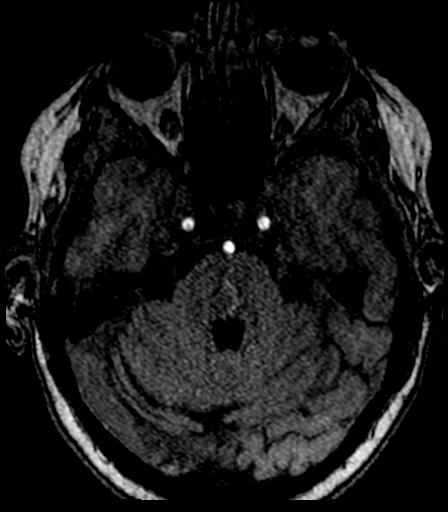
[im 93/182]
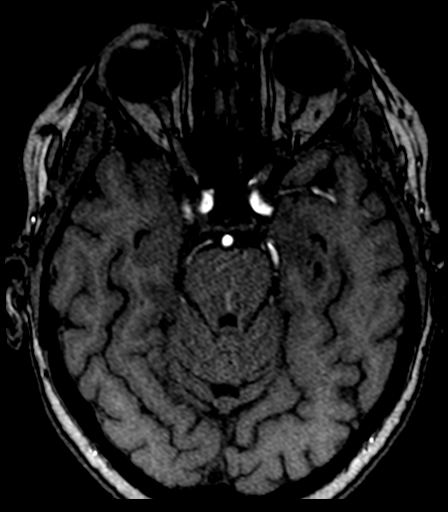
[im 104/182]
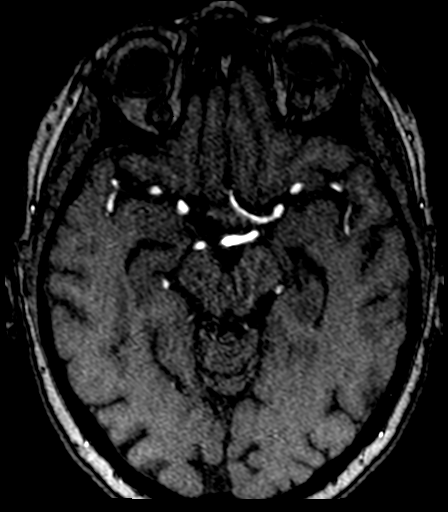
[im 128/182]
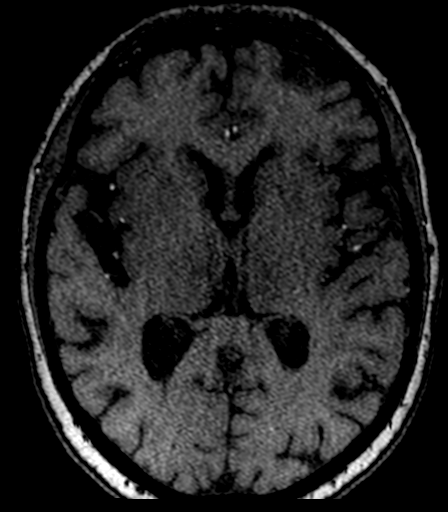
[im 151/182]
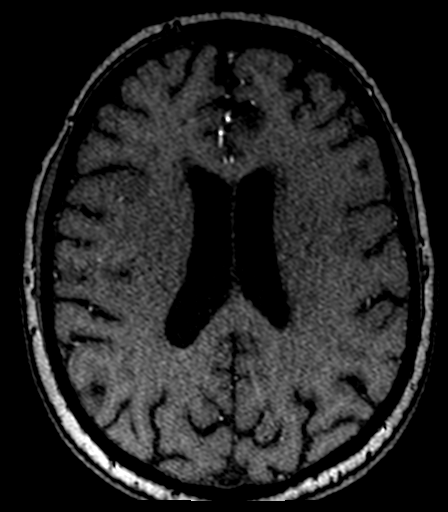
[im 155/182]
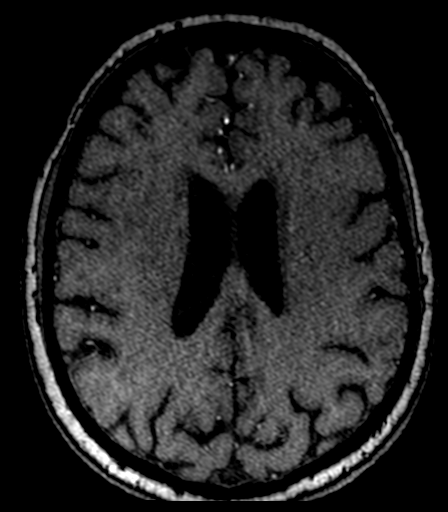
[im 174/182]
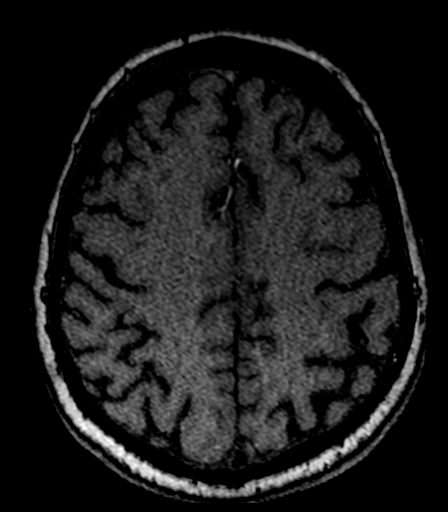

[23 of 48 positions shown; findings below may reference images not displayed]

FINDINGS: The intracranial internal carotid arteries are patent. The M1 middle
cerebral arteries are patent. No M2 proximal branch occlusion or
high-grade proximal stenosis is identified. The anterior cerebral
arteries are patent.

The intracranial vertebral arteries are patent. The basilar artery
is patent. The posterior cerebral arteries are patent. Moderate to
severe focal stenosis within the proximal P2 left PCA (series 12,
image 6).

1-2 mm posteriorly projecting vascular protrusion arising from the
supraclinoid right ICA which may reflect an infundibulum or aneurysm
(series 3, image 96) (series 101, images 110 and 111).
IMPRESSION: No intracranial large vessel occlusion.

Moderate to severe focal stenosis within the proximal P2 left
posterior cerebral artery.

No other intracranial high-grade proximal arterial stenosis is
identified.

1-2 mm vascular protrusion arising from the supraclinoid right ICA,
which may reflect an infundibulum or aneurysm.

## 2020-05-25 MED ORDER — GADOBUTROL 1 MMOL/ML IV SOLN
8.0000 mL | Freq: Once | INTRAVENOUS | Status: AC | PRN
  Administered 2020-05-25: 8 mL via INTRAVENOUS

## 2020-05-26 ENCOUNTER — Encounter: Payer: Self-pay | Admitting: Internal Medicine

## 2020-05-26 DIAGNOSIS — H903 Sensorineural hearing loss, bilateral: Secondary | ICD-10-CM | POA: Diagnosis not present

## 2020-05-26 DIAGNOSIS — R42 Dizziness and giddiness: Secondary | ICD-10-CM | POA: Diagnosis not present

## 2020-05-26 DIAGNOSIS — H8110 Benign paroxysmal vertigo, unspecified ear: Secondary | ICD-10-CM | POA: Insufficient documentation

## 2020-05-27 ENCOUNTER — Other Ambulatory Visit: Payer: Medicare Other

## 2020-05-27 NOTE — Telephone Encounter (Signed)
Hi Mrs. Hatchell and Dr. Ronnald Ramp,   There is no evidence of stroke- good news.  No high grade stenosis in neck ( vertebral and carotid arteries) , one single finding of a posterior cerebral artery stenosis was made, and by MRA graded as moderate to severe ( MRA often overstates the stenosis degree), affecting the left P2 segment. The vessel supplies occipital lobes and posteromedial temporal lobes, and in a STROKE there can be visual symptoms but typically not vertigo. It does not affect balance or hearing.   Larey Seat, MD

## 2020-05-28 ENCOUNTER — Other Ambulatory Visit: Payer: Self-pay | Admitting: Internal Medicine

## 2020-05-28 DIAGNOSIS — G450 Vertebro-basilar artery syndrome: Secondary | ICD-10-CM

## 2020-05-28 DIAGNOSIS — R42 Dizziness and giddiness: Secondary | ICD-10-CM | POA: Insufficient documentation

## 2020-05-30 NOTE — Progress Notes (Signed)
Please see separate phone note. FYI RN Mardene Celeste, Dr. Ronnald Ramp.

## 2020-06-01 ENCOUNTER — Other Ambulatory Visit: Payer: Self-pay | Admitting: Internal Medicine

## 2020-06-01 ENCOUNTER — Ambulatory Visit: Payer: Medicare Other | Admitting: Internal Medicine

## 2020-06-03 ENCOUNTER — Encounter: Payer: Self-pay | Admitting: Physical Therapy

## 2020-06-03 ENCOUNTER — Ambulatory Visit: Payer: Medicare Other | Attending: Internal Medicine | Admitting: Physical Therapy

## 2020-06-03 ENCOUNTER — Other Ambulatory Visit: Payer: Self-pay

## 2020-06-03 DIAGNOSIS — R42 Dizziness and giddiness: Secondary | ICD-10-CM | POA: Diagnosis not present

## 2020-06-03 DIAGNOSIS — R2681 Unsteadiness on feet: Secondary | ICD-10-CM | POA: Insufficient documentation

## 2020-06-03 DIAGNOSIS — M542 Cervicalgia: Secondary | ICD-10-CM | POA: Diagnosis not present

## 2020-06-04 NOTE — Therapy (Signed)
[facility] [address] [place], [place], [address] Phone: [phone]   Fax:  [phone]  Physical Therapy Evaluation  Patient Details  Name: Dana Small MRN: [id number] Date of Birth: [birth year] Referring Provider (PT): Scarlette Calico, MD   Encounter Date: 06/03/2020   PT End of Session - 06/04/20 1648    Visit Number 1    Number of Visits 9    Date for PT Re-Evaluation 07/02/20    Authorization Type Medicare    Authorization Time Period 06-03-20 - 08-06-20    PT Start Time 0934    PT Stop Time 1015    PT Time Calculation (min) 41 min    Activity Tolerance Patient tolerated treatment well    Behavior During Therapy [facility] for tasks assessed/performed           Past Medical History:  Diagnosis Date  . Arthritis   . Hypertension     Past Surgical History:  Procedure Laterality Date  . COLONOSCOPY  06/11/2013   [facility]  . TOE SURGERY    . tooth implant      There were no vitals filed for this visit.    Subjective Assessment - 06/03/20 0936    Subjective Pt reports dizziness started in Sept. 2021 - looking down at phone and with looking down in kitchen as with doing cooking activities; also had episode in pool during water aerobics 3 weeks ago when she was in deeper end; has now moved to the shallow end to be able to hold in case dizziness re-occurs; pt states it is always brought on by looking down at phone and usually looking to left; pt states she does not have dizziness unless her neck tightens up    Diagnostic tests MRI x 2;  MRA    Patient Stated Goals get rid of my neck discomfort when walking and looking down    Currently in Pain? Other (Comment)   cervical tightness - mostly Rt upper trap             [facility] PT Assessment - 06/04/20 0001      Assessment   Medical Diagnosis Dizziness; Vertebrobasilar insufficiency    Referring Provider (PT) Scarlette Calico, MD    Onset  Date/Surgical Date --   Sept. 2021   Prior Therapy None      Precautions   Precautions None      Balance Screen   Has the patient fallen in the past 6 months No    Has the patient had a decrease in activity level because of a fear of falling?  No    Is the patient reluctant to leave their home because of a fear of falling?  No      Prior Function   Level of Independence Independent                  Vestibular Assessment - 06/04/20 0001      Symptom Behavior   Subjective history of current problem Pt reports episode of dizziness in pool while she was doing water aerobics about 3 weeks ago    Type of Dizziness  Imbalance;Lightheadedness;"Funny feeling in head";"World moves"    Frequency of Dizziness varies - if she looks down she gets dizzy    Duration of Dizziness approx. 15 secs yesterday    Symptom Nature Positional    Aggravating Factors Forward bending;Comment   Looking down and to the left   Relieving Factors Head stationary  Progression of Symptoms Better      Oculomotor Exam   Oculomotor Alignment Normal    Spontaneous Absent    Smooth Pursuits Intact    Saccades Intact      Visual Acuity   Static line 10    Dynamic line 7      Positional Testing   Dix-Hallpike Dix-Hallpike Right;Dix-Hallpike Left    Sidelying Test Sidelying Right;Sidelying Left      Dix-Hallpike Right   Dix-Hallpike Right Duration none    Dix-Hallpike Right Symptoms No nystagmus      Dix-Hallpike Left   Dix-Hallpike Left Duration none    Dix-Hallpike Left Symptoms No nystagmus      Sidelying Right   Sidelying Right Duration none    Sidelying Right Symptoms No nystagmus      Sidelying Left   Sidelying Left Duration none    Sidelying Left Symptoms No nystagmus              Objective measurements completed on examination: See above findings.                 PT Short Term Goals - 06/04/20 1659      PT SHORT TERM GOAL #1   Title STG's = LTG's              PT Long Term Goals - 06/04/20 1700      PT LONG TERM GOAL #1   Title Pt will subjectively report at least 50% improvement in Rt upper trap tightness.    Time 4    Period Weeks    Status New    Target Date 07/02/20      PT LONG TERM GOAL #2   Title Improve FOTO score by at least 10 points to demo improvement in function/ vertigo.    Time 4    Period Weeks    Status New    Target Date 07/02/20      PT LONG TERM GOAL #3   Title Perform SOT and establish HEP as appropriate.    Time 4    Period Weeks    Status New    Target Date 07/02/20      PT LONG TERM GOAL #4   Title Pt will report ability to look down at phone for at least 2" and not experience dizziness.    Time 4    Period Weeks    Status New    Target Date 07/02/20      PT LONG TERM GOAL #5   Title Complete DHI and establish goal as appropriate.    Time 4    Period Weeks    Status New    Target Date 07/02/20      Additional Long Term Goals   Additional Long Term Goals Yes      PT LONG TERM GOAL #6   Title Independent in HEP for cervical stretches and vestibular exercises.    Time 4    Period Weeks    Status New    Target Date 07/02/20                  Plan - 06/04/20 1650    Clinical Impression Statement Pt is a 75 yr old lady with c/o episodic dizziness which typically occurs when she looks down and to the left.  Pt does present with Rt upper trap tightness.  Pt has been diagnosed with vertebasilar insufficiency but only mild stenosis per chart review.  No nystagmus was provoked  with any positional testing and no room spinning vertigo reported with any positional testing.  Pt reports dizziness has much improved since onset in Sept. 2021.  Will continue to evaluate for cervicogenic vertigo.    Personal Factors and Comorbidities Comorbidity 2;Time since onset of injury/illness/exacerbation;Past/Current Experience    Comorbidities LBP without sciatica, hearing loss Rt ear, HTN, OA, VBI, cerebral  microvascular disease    Examination-Activity Limitations Bend;Locomotion Level;Stand;Transfers    Examination-Participation Restrictions Cleaning;Community Activity;Yard Work;Shop;Meal Prep    Stability/Clinical Decision Making Evolving/Moderate complexity    Clinical Decision Making Moderate    Rehab Potential Good    PT Frequency 2x / week    PT Duration 4 weeks    PT Treatment/Interventions ADLs/Self Care Home Management;Balance training;Neuromuscular re-education;Patient/family education;Therapeutic activities;Therapeutic exercise;Electrical Stimulation;Vestibular;Dry needling    PT Next Visit Plan HEP - x1 viewing & cervical stretches and ROM; further assess vertigo; SOT?    Consulted and Agree with Plan of Care Patient           Patient will benefit from skilled therapeutic intervention in order to improve the following deficits and impairments:  Dizziness,Decreased balance  Visit Diagnosis: Cervicalgia - Plan: PT plan of care cert/re-cert  Dizziness and giddiness - Plan: PT plan of care cert/re-cert  Unsteadiness on feet - Plan: PT plan of care cert/re-cert     Problem List Patient Active Problem List   Diagnosis Date Noted  . Vertigo 05/28/2020  . VBI (vertebrobasilar insufficiency) 04/13/2020  . Diuretic-induced hypokalemia 04/13/2020  . Cerebral microvascular disease 04/13/2020  . Chronic bilateral low back pain without sciatica 10/14/2019  . Dizziness 10/14/2019  . Pre-diabetes 10/14/2019  . Obesity 04/25/2019  . Rectal pain 05/17/2018  . Diarrhea 05/17/2018  . Essential hypertension 06/18/2014  . Osteoarthritis 06/18/2014  . Hearing loss in right ear 06/18/2014  . Routine general medical examination at a health care facility 06/18/2014    [name], PT 06/04/2020, 5:14 PM  [facility] [address] [place], [place], [address] Phone: [phone]   Fax:  [phone]  Name: Dana Small MRN: [id number] Date of Birth: January 17, 1946

## 2020-06-14 ENCOUNTER — Other Ambulatory Visit: Payer: Self-pay

## 2020-06-14 ENCOUNTER — Ambulatory Visit (INDEPENDENT_AMBULATORY_CARE_PROVIDER_SITE_OTHER): Payer: Medicare Other | Admitting: Internal Medicine

## 2020-06-14 ENCOUNTER — Encounter: Payer: Self-pay | Admitting: Internal Medicine

## 2020-06-14 VITALS — BP 148/80 | HR 72 | Temp 98.1°F | Resp 16 | Ht 64.0 in | Wt 186.0 lb

## 2020-06-14 DIAGNOSIS — I10 Essential (primary) hypertension: Secondary | ICD-10-CM

## 2020-06-14 DIAGNOSIS — E876 Hypokalemia: Secondary | ICD-10-CM

## 2020-06-14 DIAGNOSIS — T502X5A Adverse effect of carbonic-anhydrase inhibitors, benzothiadiazides and other diuretics, initial encounter: Secondary | ICD-10-CM

## 2020-06-14 DIAGNOSIS — R42 Dizziness and giddiness: Secondary | ICD-10-CM

## 2020-06-14 DIAGNOSIS — E785 Hyperlipidemia, unspecified: Secondary | ICD-10-CM

## 2020-06-14 LAB — HEPATIC FUNCTION PANEL
ALT: 24 U/L (ref 0–35)
AST: 21 U/L (ref 0–37)
Albumin: 4.1 g/dL (ref 3.5–5.2)
Alkaline Phosphatase: 70 U/L (ref 39–117)
Bilirubin, Direct: 0 mg/dL (ref 0.0–0.3)
Total Bilirubin: 0.3 mg/dL (ref 0.2–1.2)
Total Protein: 7.3 g/dL (ref 6.0–8.3)

## 2020-06-14 LAB — BASIC METABOLIC PANEL
BUN: 26 mg/dL — ABNORMAL HIGH (ref 6–23)
CO2: 31 mEq/L (ref 19–32)
Calcium: 9.5 mg/dL (ref 8.4–10.5)
Chloride: 101 mEq/L (ref 96–112)
Creatinine, Ser: 0.72 mg/dL (ref 0.40–1.20)
GFR: 82.4 mL/min (ref >60.00)
Glucose, Bld: 96 mg/dL (ref 70–99)
Potassium: 3.5 mEq/L (ref 3.5–5.1)
Sodium: 138 mEq/L (ref 135–145)

## 2020-06-14 LAB — CBC WITH DIFFERENTIAL/PLATELET
Basophils Absolute: 0 10*3/uL (ref 0.0–0.1)
Basophils Relative: 0.5 % (ref 0.0–3.0)
Eosinophils Absolute: 0.1 10*3/uL (ref 0.0–0.7)
Eosinophils Relative: 0.8 % (ref 0.0–5.0)
HCT: 41.1 % (ref 36.0–46.0)
Hemoglobin: 14 g/dL (ref 12.0–15.0)
Lymphocytes Relative: 27.3 % (ref 12.0–46.0)
Lymphs Abs: 2.1 10*3/uL (ref 0.7–4.0)
MCHC: 34 g/dL (ref 30.0–36.0)
MCV: 93 fl (ref 78.0–100.0)
Monocytes Absolute: 0.7 10*3/uL (ref 0.1–1.0)
Monocytes Relative: 8.8 % (ref 3.0–12.0)
Neutro Abs: 4.8 10*3/uL (ref 1.4–7.7)
Neutrophils Relative %: 62.6 % (ref 43.0–77.0)
Platelets: 189 10*3/uL (ref 150.0–400.0)
RBC: 4.42 Mil/uL (ref 3.87–5.11)
RDW: 13.2 % (ref 11.5–15.5)
WBC: 7.6 10*3/uL (ref 4.0–10.5)

## 2020-06-14 LAB — LIPID PANEL
Cholesterol: 154 mg/dL (ref 0–200)
HDL: 51.3 mg/dL (ref >39.00)
LDL Cholesterol: 81 mg/dL (ref 0–99)
NonHDL: 102.51
Total CHOL/HDL Ratio: 3
Triglycerides: 110 mg/dL (ref 0.0–149.0)
VLDL: 22 mg/dL (ref 0.0–40.0)

## 2020-06-14 LAB — MAGNESIUM: Magnesium: 2 mg/dL (ref 1.5–2.5)

## 2020-06-14 LAB — TSH: TSH: 2.21 u[IU]/mL (ref 0.35–4.50)

## 2020-06-14 MED ORDER — DIAZEPAM 2 MG PO TABS: 2.0000 mg | ORAL_TABLET | Freq: Three times a day (TID) | ORAL | 1 refills | Status: DC | PRN

## 2020-06-14 NOTE — Progress Notes (Signed)
Subjective:  Patient ID: Dana Small, female    DOB: February 23, 1946  Age: 75 y.o. MRN: [id number]  CC: Hypertension  This visit occurred during the SARS-CoV-2 public health emergency.  Safety protocols were in place, including screening questions prior to the visit, additional usage of staff PPE, and extensive cleaning of exam room while observing appropriate contact time as indicated for disinfecting solutions.    HPI Sharlet Notaro presents for f/up -  She continues to complain of dizziness and vertigo.  She does not know a pattern as to whether or not this is caused by medications or alterations in her blood pressure.  Her blood pressure at times is mildly elevated and then at other times is mildly low.  She denies lightheadedness, palpitations, paresthesias, or slurred speech.  Outpatient Medications Prior to Visit  Medication Sig Dispense Refill  . irbesartan (AVAPRO) 150 MG tablet Take 1 tablet (150 mg total) by mouth daily. 90 tablet 1  . Multiple Vitamins-Minerals (MULTIVITAMIN ADULT PO) Take by mouth daily.    . pravastatin (PRAVACHOL) 20 MG tablet Take 1 tablet (20 mg total) by mouth daily. 90 tablet 3  . hydrochlorothiazide (HYDRODIURIL) 25 MG tablet TAKE 1 TABLET(25 MG) BY MOUTH DAILY 30 tablet 3  . potassium chloride SA (KLOR-CON) 20 MEQ tablet Take 1 tablet (20 mEq total) by mouth 3 (three) times daily. 270 tablet 0  . Cholecalciferol (VITAMIN D3 PO) Take by mouth daily.    . meclizine (ANTIVERT) 12.5 MG tablet Take 1 tablet (12.5 mg total) by mouth 3 (three) times daily as needed for dizziness. (Patient not taking: Reported on 06/03/2020) 30 tablet 0   No facility-administered medications prior to visit.    ROS Review of Systems  Constitutional: Negative for chills, diaphoresis, fatigue and fever.  HENT: Negative.   Eyes: Negative for visual disturbance.  Respiratory: Negative for cough, chest tightness, shortness of breath and wheezing.   Cardiovascular: Negative for  chest pain, palpitations and leg swelling.  Gastrointestinal: Negative for abdominal pain, diarrhea, nausea and vomiting.  Endocrine: Negative.   Genitourinary: Negative.  Negative for difficulty urinating.  Musculoskeletal: Negative for arthralgias and myalgias.  Skin: Negative.  Negative for color change and pallor.  Neurological: Positive for dizziness. Negative for weakness, light-headedness and headaches.  Hematological: Negative for adenopathy. Does not bruise/bleed easily.  Psychiatric/Behavioral: Negative.     Objective:  BP (!) 148/80   Pulse 72   Temp 98.1 F (36.7 C) (Oral)   Resp 16   Ht 5\' 4"  (1.626 m)   Wt 186 lb (84.4 kg)   SpO2 98%   BMI 31.93 kg/m   BP Readings from Last 3 Encounters:  06/14/20 (!) 148/80  04/13/20 (!) 142/82  04/02/20 138/72    Wt Readings from Last 3 Encounters:  06/14/20 186 lb (84.4 kg)  05/05/20 182 lb 6.4 oz (82.7 kg)  04/13/20 186 lb (84.4 kg)    Physical Exam Vitals reviewed.  Constitutional:      Appearance: Normal appearance.  HENT:     Nose: Nose normal.     Mouth/Throat:     Mouth: Mucous membranes are moist.  Eyes:     General: No scleral icterus.    Conjunctiva/sclera: Conjunctivae normal.  Cardiovascular:     Rate and Rhythm: Normal rate and regular rhythm.     Heart sounds: No murmur heard.   Pulmonary:     Effort: Pulmonary effort is normal.     Breath sounds: No stridor. No wheezing, rhonchi or  rales.  Abdominal:     General: Abdomen is flat. Bowel sounds are normal. There is no distension.     Palpations: Abdomen is soft. There is no hepatomegaly, splenomegaly or mass.  Musculoskeletal:        General: Normal range of motion.     Cervical back: Neck supple.     Right lower leg: No edema.     Left lower leg: No edema.  Lymphadenopathy:     Cervical: No cervical adenopathy.  Skin:    General: Skin is warm and dry.     Coloration: Skin is not pale.  Neurological:     General: No focal deficit  present.     Mental Status: She is alert and oriented to person, place, and time. Mental status is at baseline.  Psychiatric:        Mood and Affect: Mood normal.        Behavior: Behavior normal.     Lab Results  Component Value Date   WBC 7.6 06/14/2020   HGB 14.0 06/14/2020   HCT 41.1 06/14/2020   PLT 189.0 06/14/2020   GLUCOSE 96 06/14/2020   CHOL 154 06/14/2020   TRIG 110.0 06/14/2020   HDL 51.30 06/14/2020   LDLCALC 81 06/14/2020   ALT 24 06/14/2020   AST 21 06/14/2020   NA 138 06/14/2020   K 3.5 06/14/2020   CL 101 06/14/2020   CREATININE 0.72 06/14/2020   BUN 26 (H) 06/14/2020   CO2 31 06/14/2020   TSH 2.21 06/14/2020   HGBA1C 5.8 04/13/2020    MR Angiogram Head Wo Contrast  Result Date: 05/25/2020 CLINICAL DATA:  Vertebrobasilar insufficiency. Cerebral microvascular disease. Dizziness, nonspecific. Additional history provided by scanning technologist: Patient reports dizziness, vertigo with head tilt forward, history of small vessel disease, history of hypertension. EXAM: MRA HEAD WITHOUT CONTRAST TECHNIQUE: Angiographic images of the Circle of Willis were obtained using MRA technique without intravenous contrast. COMPARISON:  Brain MRI 12/23/2019. FINDINGS: The intracranial internal carotid arteries are patent. The M1 middle cerebral arteries are patent. No M2 proximal branch occlusion or high-grade proximal stenosis is identified. The anterior cerebral arteries are patent. The intracranial vertebral arteries are patent. The basilar artery is patent. The posterior cerebral arteries are patent. Moderate to severe focal stenosis within the proximal P2 left PCA (series 12, image 6). 1-2 mm posteriorly projecting vascular protrusion arising from the supraclinoid right ICA which may reflect an infundibulum or aneurysm (series 3, image 96) (series 101, images 110 and 111). IMPRESSION: No intracranial large vessel occlusion. Moderate to severe focal stenosis within the proximal P2  left posterior cerebral artery. No other intracranial high-grade proximal arterial stenosis is identified. 1-2 mm vascular protrusion arising from the supraclinoid right ICA, which may reflect an infundibulum or aneurysm. Electronically Signed   By: Kellie Simmering DO   On: 05/25/2020 09:59   MR Angiogram Neck W Wo Contrast  Result Date: 05/25/2020 CLINICAL DATA:  Dizziness, vertigo and head tilt. EXAM: MRA NECK WITHOUT AND WITH CONTRAST TECHNIQUE: Multiplanar and multiecho pulse sequences of the neck were obtained without and with intravenous contrast. Angiographic images of the neck were obtained using MRA technique without and with intravenous contrast. CONTRAST:  75mL GADAVIST GADOBUTROL 1 MMOL/ML IV SOLN COMPARISON:  None. FINDINGS: Great vessel origins are patent. Bilateral carotid arteries are patent without evidence of hemodynamically significant stenosis. Mild narrowing at the carotid bifurcations without evidence of greater than 50% narrowing. Codominant vertebral arteries, which are tortuous but without evidence of  hemodynamically significant stenosis. Mild narrowing of the proximal left vertebral artery without evidence of greater than 50% narrowing. IMPRESSION: No evidence of hemodynamically significant stenosis in the neck. Mild proximal left vertebral artery stenosis. Electronically Signed   By: Margaretha Sheffield MD   On: 05/25/2020 10:56    Assessment & Plan:   Chalyn was seen today for hypertension.  Diagnoses and all orders for this visit:  Essential hypertension- Her dizziness may be caused by an overcontrolled blood pressure.  I recommended that she decrease the dose of hydrochlorothiazide by 50%. -     CBC with Differential/Platelet; Future -     Magnesium; Future -     TSH; Future -     TSH -     Magnesium -     CBC with Differential/Platelet -     hydrochlorothiazide (MICROZIDE) 12.5 MG capsule; Take 1 capsule (12.5 mg total) by mouth daily. -     potassium chloride (KLOR-CON  10) 10 MEQ tablet; Take 1 tablet (10 mEq total) by mouth 2 (two) times daily.  Diuretic-induced hypokalemia- She complains that the current potassium tablet is too big.  Her potassium level is barely normal at 3.5.  I recommended that she continue the potassium supplement with a smaller tablet. -     Basic metabolic panel; Future -     Magnesium; Future -     Magnesium -     Basic metabolic panel -     potassium chloride (KLOR-CON 10) 10 MEQ tablet; Take 1 tablet (10 mEq total) by mouth 2 (two) times daily.  Hyperlipidemia with target LDL less than 100- She has achieved her LDL goal and is doing well on the statin. -     Hepatic function panel; Future -     Lipid panel; Future -     Lipid panel -     Hepatic function panel  Vertigo -     diazepam (VALIUM) 2 MG tablet; Take 1 tablet (2 mg total) by mouth every 8 (eight) hours as needed for anxiety.   I have discontinued Mechele Claude Azure's Cholecalciferol (VITAMIN D3 PO), meclizine, potassium chloride SA, and hydrochlorothiazide. I am also having her start on diazepam, hydrochlorothiazide, and potassium chloride. Additionally, I am having her maintain her Multiple Vitamins-Minerals (MULTIVITAMIN ADULT PO), pravastatin, and irbesartan.  Meds ordered this encounter  Medications  . diazepam (VALIUM) 2 MG tablet    Sig: Take 1 tablet (2 mg total) by mouth every 8 (eight) hours as needed for anxiety.    Dispense:  90 tablet    Refill:  1  . hydrochlorothiazide (MICROZIDE) 12.5 MG capsule    Sig: Take 1 capsule (12.5 mg total) by mouth daily.    Dispense:  90 capsule    Refill:  1  . potassium chloride (KLOR-CON 10) 10 MEQ tablet    Sig: Take 1 tablet (10 mEq total) by mouth 2 (two) times daily.    Dispense:  180 tablet    Refill:  1     Follow-up: Return in about 3 months (around 09/11/2020).  Scarlette Calico, MD

## 2020-06-14 NOTE — Patient Instructions (Signed)

## 2020-06-15 ENCOUNTER — Encounter: Payer: Self-pay | Admitting: Internal Medicine

## 2020-06-15 ENCOUNTER — Ambulatory Visit: Payer: Medicare Other | Admitting: Physical Therapy

## 2020-06-15 MED ORDER — POTASSIUM CHLORIDE ER 10 MEQ PO TBCR
10.0000 meq | EXTENDED_RELEASE_TABLET | Freq: Two times a day (BID) | ORAL | 1 refills | Status: DC
Start: 2020-06-15 — End: 2020-10-21

## 2020-06-15 MED ORDER — HYDROCHLOROTHIAZIDE 12.5 MG PO CAPS: 12.5000 mg | ORAL_CAPSULE | Freq: Every day | ORAL | 1 refills | Status: DC

## 2020-06-16 ENCOUNTER — Other Ambulatory Visit: Payer: Self-pay

## 2020-06-16 ENCOUNTER — Ambulatory Visit: Payer: Medicare Other | Attending: Internal Medicine | Admitting: Physical Therapy

## 2020-06-16 DIAGNOSIS — M542 Cervicalgia: Secondary | ICD-10-CM | POA: Insufficient documentation

## 2020-06-16 DIAGNOSIS — R42 Dizziness and giddiness: Secondary | ICD-10-CM

## 2020-06-17 ENCOUNTER — Ambulatory Visit: Payer: Medicare Other | Admitting: Physical Therapy

## 2020-06-17 DIAGNOSIS — M542 Cervicalgia: Secondary | ICD-10-CM | POA: Diagnosis not present

## 2020-06-17 DIAGNOSIS — R42 Dizziness and giddiness: Secondary | ICD-10-CM | POA: Diagnosis not present

## 2020-06-17 NOTE — Patient Instructions (Signed)
Extension    Hands behind neck, bend head back as far as is comfortable. Hold _5___ seconds. Repeat __5__ times. Do _1-2___ sessions per day.  Lateral Flexion    With head in comfortable, centered position and chin slightly tucked, gently bring right ear toward right shoulder. Hold __30__ seconds. Repeat with left side. Repeat _3___ times. Do __1-2__ sessions per day.  Use same hand to pull head down toward shoulder - hold 30 secs  http://gt2.exer.us/9   Copyright  VHI. All rights reserved.    Copyright  VHI. All rights reserved.  AROM: Neck Flexion    Bend head forward. Hold _2___ seconds. Repeat __10__ times per set. Do _1___ sets per session. Do _1___ sessions per day.  http://orth.exer.us/299   Copyright  VHI. All rights reserved.

## 2020-06-17 NOTE — Therapy (Signed)
[facility] [address] [place], [place], [address] Phone: [phone]   Fax:  [phone]  Physical Therapy Treatment  Patient Details  Name: Dana Small MRN: [id number] Date of Birth: 09-27-1945 Referring Provider (PT): Scarlette Calico, MD   Encounter Date: 06/16/2020   PT End of Session - 06/17/20 0855    Visit Number 2    Number of Visits 9    Date for PT Re-Evaluation 07/02/20    Authorization Type Medicare    Authorization Time Period 06-03-20 - 08-06-20    PT Start Time 0935    PT Stop Time 1017    PT Time Calculation (min) 42 min    Activity Tolerance Patient tolerated treatment well    Behavior During Therapy [facility] for tasks assessed/performed           Past Medical History:  Diagnosis Date  . Arthritis   . Hypertension     Past Surgical History:  Procedure Laterality Date  . COLONOSCOPY  06/11/2013   [facility]  . TOE SURGERY    . tooth implant      There were no vitals filed for this visit.   Subjective Assessment - 06/16/20 0938    Subjective Pt reports she had a good day yesterday but had an episode of dizziness on Monday while driving; has had blood work done and found out that her potassium is low - has now been prescribed lower dosage of the diuretic  - episode on Monday could have been caused by low potassium and dehydration    Diagnostic tests MRI x 2;  MRA    Patient Stated Goals get rid of my neck discomfort when walking and looking down    Currently in Pain? No/denies                             [facility] Adult PT Treatment/Exercise - 06/17/20 0001      Neck Exercises: Seated   Other Seated Exercise Mulligan's stretch with towel - 3 reps to each side - 30 sec hold      Neck Exercises: Prone   Neck Retraction 5 reps   5 sec hold          Vestibular Treatment/Exercise - 06/17/20 0001      Vestibular Treatment/Exercise   Vestibular Treatment  Provided Gaze    Gaze Exercises X1 Viewing Horizontal;X1 Viewing Vertical      X1 Viewing Horizontal   Foot Position bil. stance - on floor    Time --   60 secs   Reps 1    Comments no c/o increasesd dizziness upon completion of exercise      X1 Viewing Vertical   Foot Position bil. stance - on floor    Time --   60 secs   Reps 1    Comments no c/o increased dizziness upon completion of exercise                 PT Education - 06/17/20 0855    Education Details Medbridge HEP - [id number]    Person(s) Educated Patient    Methods Explanation;Handout;Demonstration    Comprehension Verbalized understanding;Returned demonstration            PT Short Term Goals - 06/04/20 1659      PT SHORT TERM GOAL #1   Title STG's = LTG's  PT Long Term Goals - 06/17/20 0901      PT LONG TERM GOAL #1   Title Pt will subjectively report at least 50% improvement in Rt upper trap tightness.    Time 4    Period Weeks    Status New      PT LONG TERM GOAL #2   Title Improve FOTO score by at least 10 points to demo improvement in function/ vertigo.    Time 4    Period Weeks    Status New      PT LONG TERM GOAL #3   Title Perform SOT and establish HEP as appropriate.    Time 4    Period Weeks    Status New      PT LONG TERM GOAL #4   Title Pt will report ability to look down at phone for at least 2" and not experience dizziness.    Time 4    Period Weeks    Status New      PT LONG TERM GOAL #5   Title Complete DHI and establish goal as appropriate.    Time 4    Period Weeks    Status New      PT LONG TERM GOAL #6   Title Independent in HEP for cervical stretches and vestibular exercises.    Time 4    Period Weeks    Status New                 Plan - 06/16/20 0945    Clinical Impression Statement Pt reports much improvement in Rt upper trap tightness after performing strengthening exercise for her shoulders at the gym - pt states she used a weight  machine and it significantly helped.  Pt able to perform gaze stabilization exercise for 1" (both horizontal & vertical directions) and had no increased c/o dizziness upon completion of exercise.  Etiology of dizziness remains unknown - does not appear to be cervicogenic at this time.  Cont with POC.    Personal Factors and Comorbidities Comorbidity 2;Time since onset of injury/illness/exacerbation;Past/Current Experience    Comorbidities LBP without sciatica, hearing loss Rt ear, HTN, OA, VBI, cerebral microvascular disease    Examination-Activity Limitations Bend;Locomotion Level;Stand;Transfers    Examination-Participation Restrictions Cleaning;Community Activity;Yard Work;Shop;Meal Prep    Stability/Clinical Decision Making Evolving/Moderate complexity    Rehab Potential Good    PT Frequency 2x / week    PT Duration 4 weeks    PT Treatment/Interventions ADLs/Self Care Home Management;Balance training;Neuromuscular re-education;Patient/family education;Therapeutic activities;Therapeutic exercise;Electrical Stimulation;Vestibular;Dry needling    PT Next Visit Plan HEP - x1 viewing & cervical stretches and ROM; further assess vertigo; SOT?    Consulted and Agree with Plan of Care Patient           Patient will benefit from skilled therapeutic intervention in order to improve the following deficits and impairments:  Dizziness,Decreased balance  Visit Diagnosis: Dizziness and giddiness  Cervicalgia     Problem List Patient Active Problem List   Diagnosis Date Noted  . Hyperlipidemia with target LDL less than 100 06/14/2020  . Vertigo 05/28/2020  . VBI (vertebrobasilar insufficiency) 04/13/2020  . Diuretic-induced hypokalemia 04/13/2020  . Cerebral microvascular disease 04/13/2020  . Chronic bilateral low back pain without sciatica 10/14/2019  . Pre-diabetes 10/14/2019  . Obesity 04/25/2019  . Essential hypertension 06/18/2014  . Osteoarthritis 06/18/2014  . Routine general  medical examination at a health care facility 06/18/2014    [name], PT 06/17/2020, 9:05  [facility] [address] [place], [place], [address] Phone: [phone]   Fax:  [phone]  Name: Dana Small MRN: [id number] Date of Birth: May 11, 1945

## 2020-06-18 NOTE — Therapy (Signed)
[facility] [address] [place], [place], [address] Phone: [phone]   Fax:  [phone]  Physical Therapy Treatment  Patient Details  Name: Dana Small MRN: [id number] Date of Birth: Jun 05, 1945 Referring Provider (PT): Scarlette Calico, MD   Encounter Date: 06/17/2020   PT End of Session - 06/18/20 2002    Visit Number 3    Number of Visits 9    Date for PT Re-Evaluation 07/02/20    Authorization Type Medicare    Authorization Time Period 06-03-20 - 08-06-20    PT Start Time 0932    PT Stop Time 1016    PT Time Calculation (min) 44 min    Activity Tolerance Patient tolerated treatment well    Behavior During Therapy [facility] for tasks assessed/performed           Past Medical History:  Diagnosis Date  . Arthritis   . Hypertension     Past Surgical History:  Procedure Laterality Date  . COLONOSCOPY  06/11/2013   [facility]  . TOE SURGERY    . tooth implant      There were no vitals filed for this visit.   Subjective Assessment - 06/18/20 1939    Subjective Pt reports no dizziness since session yesterday - states she is doing the neck stretches    Diagnostic tests MRI x 2;  MRA    Patient Stated Goals get rid of my neck discomfort when walking and looking down    Currently in Pain? No/denies                   Vestibular Assessment - 06/18/20 0001      Positional Testing   Dix-Hallpike Dix-Hallpike Right;Dix-Hallpike Left    Sidelying Test Sidelying Right;Sidelying Left      Dix-Hallpike Right   Dix-Hallpike Right Duration none    Dix-Hallpike Right Symptoms No nystagmus      Dix-Hallpike Left   Dix-Hallpike Left Duration none    Dix-Hallpike Left Symptoms No nystagmus      Sidelying Right   Sidelying Right Duration none    Sidelying Right Symptoms No nystagmus      Sidelying Left   Sidelying Left Duration none    Sidelying Left Symptoms No nystagmus            Sensory Organization Test - composite score 71/100 ; N= 65/100  Somatosensory, visual, and vestibular inputs are all WNL's  Condition 1 - all 3 trials WNL's Condition 2 - all 3 trials WNL's Condition 3 - all 3 trials WNL's Condition 4 - all 3 trials WNL's Condition 5 - all 3 trials WNL's Conditon 6 - FALL on trial 1:  Trials 2 and 3 WNL's         [facility] Adult PT Treatment/Exercise - 06/18/20 0001      Exercises   Exercises Neck      Neck Exercises: Seated   Lateral Flexion Both;Other (comment)   2 reps - 15 sec hold   Other Seated Exercise cervical flexion/extension 10 reps                  PT Education - 06/18/20 1959    Education Details added cervical lateral flexion and flexion/extension to HEP    Person(s) Educated Patient    Methods Explanation;Demonstration;Handout    Comprehension Verbalized understanding;Returned demonstration            PT Short Term Goals - 06/04/20 1659  PT SHORT TERM GOAL #1   Title STG's = LTG's             PT Long Term Goals - 06/18/20 2007      PT LONG TERM GOAL #1   Title Pt will subjectively report at least 50% improvement in Rt upper trap tightness.    Time 4    Period Weeks    Status New      PT LONG TERM GOAL #2   Title Improve FOTO score by at least 10 points to demo improvement in function/ vertigo.    Time 4    Period Weeks    Status New      PT LONG TERM GOAL #3   Title Perform SOT and establish HEP as appropriate.    Time 4    Period Weeks    Status New      PT LONG TERM GOAL #4   Title Pt will report ability to look down at phone for at least 2" and not experience dizziness.    Time 4    Period Weeks    Status New      PT LONG TERM GOAL #5   Title Complete DHI and establish goal as appropriate.    Time 4    Period Weeks    Status New      PT LONG TERM GOAL #6   Title Independent in HEP for cervical stretches and vestibular exercises.    Time 4    Period Weeks    Status New                  Plan - 06/18/20 2002    Clinical Impression Statement Pt's Sensory Organization test score is WNL's with score 71/100, with N=65/100; somatosensory, visual and vestibular inputs are all WNL's.  Positional testing is negative with no nystagmus noted and no c/o vertigo with any positions. Etiology of dizziness does not appear to be of vestibular system dysfunction at this time.    Personal Factors and Comorbidities Comorbidity 2;Time since onset of injury/illness/exacerbation;Past/Current Experience    Comorbidities LBP without sciatica, hearing loss Rt ear, HTN, OA, VBI, cerebral microvascular disease    Examination-Activity Limitations Bend;Locomotion Level;Stand;Transfers    Examination-Participation Restrictions Cleaning;Community Activity;Yard Work;Shop;Meal Prep    Stability/Clinical Decision Making Evolving/Moderate complexity    Rehab Potential Good    PT Frequency 2x / week    PT Duration 4 weeks    PT Treatment/Interventions ADLs/Self Care Home Management;Balance training;Neuromuscular re-education;Patient/family education;Therapeutic activities;Therapeutic exercise;Electrical Stimulation;Vestibular;Dry needling    PT Next Visit Plan HEP - x1 viewing & cervical stretches and ROM; further assess vertigo    Consulted and Agree with Plan of Care Patient           Patient will benefit from skilled therapeutic intervention in order to improve the following deficits and impairments:  Dizziness,Decreased balance  Visit Diagnosis: Dizziness and giddiness  Cervicalgia     Problem List Patient Active Problem List   Diagnosis Date Noted  . Hyperlipidemia with target LDL less than 100 06/14/2020  . Vertigo 05/28/2020  . VBI (vertebrobasilar insufficiency) 04/13/2020  . Diuretic-induced hypokalemia 04/13/2020  . Cerebral microvascular disease 04/13/2020  . Chronic bilateral low back pain without sciatica 10/14/2019  . Pre-diabetes 10/14/2019  . Obesity  04/25/2019  . Essential hypertension 06/18/2014  . Osteoarthritis 06/18/2014  . Routine general medical examination at a health care facility 06/18/2014    [name], PT 06/18/2020, 8:08 PM  [facility] [address] [place], [place], [address] Phone: [phone]   Fax:  [phone]  Name: Dana Small MRN: [id number] Date of Birth: [birth year]

## 2020-06-22 ENCOUNTER — Other Ambulatory Visit: Payer: Self-pay

## 2020-06-22 ENCOUNTER — Ambulatory Visit: Payer: Medicare Other | Admitting: Physical Therapy

## 2020-06-22 DIAGNOSIS — R42 Dizziness and giddiness: Secondary | ICD-10-CM

## 2020-06-22 DIAGNOSIS — M542 Cervicalgia: Secondary | ICD-10-CM | POA: Diagnosis not present

## 2020-06-23 NOTE — Therapy (Addendum)
[facility] [address] [place], [place], [address] Phone: [phone]   Fax:  [phone]  Physical Therapy Treatment  Patient Details  Name: Dana Small MRN: [id number] Date of Birth: 21-Oct-1945 Referring Provider (PT): Scarlette Calico, MD   Encounter Date: 06/22/2020   PT End of Session - 06/23/20 2144    Visit Number 4    Number of Visits 9    Date for PT Re-Evaluation 07/02/20    Authorization Type Medicare    Authorization Time Period 06-03-20 - 08-06-20    PT Start Time 0935    PT Stop Time 1017    PT Time Calculation (min) 42 min    Activity Tolerance Patient tolerated treatment well    Behavior During Therapy [facility] for tasks assessed/performed           Past Medical History:  Diagnosis Date  . Arthritis   . Hypertension     Past Surgical History:  Procedure Laterality Date  . COLONOSCOPY  06/11/2013   [facility]  . TOE SURGERY    . tooth implant      There were no vitals filed for this visit.         TherEx:   Pt performed scapular retraction exercises and strengthening exercises for upper traps  Instructed in Medbridge HEP [id number] - red theraband was attempted for resistance with external rotation with flexed elbows, but pt had difficulty performing with use of theraband so performed AROM only -  scapular retraction with elbows flexed at 90 degrees 10 reps  Bil. Shoulder extension with elbows flexed at 90 degrees with red theraband 10 reps    Cervical retraction in supine position - 5 reps, 3 sec hold; performed in seated position 5 reps with 3 sec hold   Pt performed distraction with use of towel while pushing head back into towel for cervical retraction - 5 reps                  PT Short Term Goals - 06/04/20 1659      PT SHORT TERM GOAL #1   Title STG's = LTG's             PT Long Term Goals - 06/23/20 2145      PT LONG TERM GOAL #1   Title  Pt will subjectively report at least 50% improvement in Rt upper trap tightness.    Time 4    Period Weeks    Status New      PT LONG TERM GOAL #2   Title Improve FOTO score by at least 10 points to demo improvement in function/ vertigo.    Time 4    Period Weeks    Status New      PT LONG TERM GOAL #3   Title Perform SOT and establish HEP as appropriate.    Time 4    Period Weeks    Status New      PT LONG TERM GOAL #4   Title Pt will report ability to look down at phone for at least 2" and not experience dizziness.    Time 4    Period Weeks    Status New      PT LONG TERM GOAL #5   Title Complete DHI and establish goal as appropriate.    Time 4    Period Weeks    Status New      PT LONG TERM GOAL #6   Title  Independent in HEP for cervical stretches and vestibular exercises.    Time 4    Period Weeks    Status New                 Plan - 06/23/20 2145    Personal Factors and Comorbidities Comorbidity 2;Time since onset of injury/illness/exacerbation;Past/Current Experience    Comorbidities LBP without sciatica, hearing loss Rt ear, HTN, OA, VBI, cerebral microvascular disease    Examination-Activity Limitations Bend;Locomotion Level;Stand;Transfers    Examination-Participation Restrictions Cleaning;Community Activity;Yard Work;Shop;Meal Prep    Stability/Clinical Decision Making Evolving/Moderate complexity    Rehab Potential Good    PT Frequency 2x / week    PT Duration 4 weeks    PT Treatment/Interventions ADLs/Self Care Home Management;Balance training;Neuromuscular re-education;Patient/family education;Therapeutic activities;Therapeutic exercise;Electrical Stimulation;Vestibular;Dry needling    PT Next Visit Plan HEP - x1 viewing & cervical stretches and ROM; further assess vertigo    Consulted and Agree with Plan of Care Patient           Patient will benefit from skilled therapeutic intervention in order to improve the following deficits and  impairments:  Dizziness,Decreased balance  Visit Diagnosis: Dizziness and giddiness  Cervicalgia     Problem List Patient Active Problem List   Diagnosis Date Noted  . Hyperlipidemia with target LDL less than 100 06/14/2020  . Vertigo 05/28/2020  . VBI (vertebrobasilar insufficiency) 04/13/2020  . Diuretic-induced hypokalemia 04/13/2020  . Cerebral microvascular disease 04/13/2020  . Chronic bilateral low back pain without sciatica 10/14/2019  . Pre-diabetes 10/14/2019  . Obesity 04/25/2019  . Essential hypertension 06/18/2014  . Osteoarthritis 06/18/2014  . Routine general medical examination at a health care facility 06/18/2014    [name], PT 06/23/2020, 9:46 PM  [facility] [address] [place], [place], [address] Phone: [phone]   Fax:  [phone]  Name: Dana Small MRN: [id number] Date of Birth: April 22, 1945

## 2020-06-24 ENCOUNTER — Ambulatory Visit: Payer: Medicare Other | Admitting: Physical Therapy

## 2020-06-29 ENCOUNTER — Ambulatory Visit: Payer: Medicare Other | Admitting: Physical Therapy

## 2020-06-29 ENCOUNTER — Other Ambulatory Visit: Payer: Self-pay

## 2020-06-29 DIAGNOSIS — R42 Dizziness and giddiness: Secondary | ICD-10-CM

## 2020-06-29 DIAGNOSIS — M542 Cervicalgia: Secondary | ICD-10-CM

## 2020-06-29 NOTE — Patient Instructions (Signed)
    Wall Push-Up    With feet and hands shoulder-width apart, lean into wall, then push away from wall. Repeat ____ times or for ____ minutes. Do ____ sessions per day.  http://cc.exer.us/57    "W" stretch = face a corner;  Arms are lifted at 90 degrees shoulder flexion, elbows bent at 90  Arms on each side of corner walls - lean into corner - keep head straight ahead ; hold 20-30 secs   1-2x/day    "Y" - lift both arms up as high as possible on wall for a stretch - hold 20-30 secs

## 2020-06-30 ENCOUNTER — Encounter: Payer: Self-pay | Admitting: Physical Therapy

## 2020-06-30 NOTE — Therapy (Signed)
[facility] [address] [place], [place], [address] Phone: [phone]   Fax:  [phone]  Physical Therapy Treatment  Patient Details  Name: Dana Small MRN: [id number] Date of Birth: [birth year] Referring Provider (PT): Dana Calico, MD   Encounter Date: 06/29/2020   PT End of Session - 06/30/20 1950    Visit Number 5    Number of Visits 9    Date for PT Re-Evaluation 07/02/20    Authorization Type Medicare    Authorization Time Period 06-03-20 - 08-06-20    PT Start Time 1017    PT Stop Time 1100    PT Time Calculation (min) 43 min    Activity Tolerance Patient tolerated treatment well    Behavior During Therapy [facility] for tasks assessed/performed           Past Medical History:  Diagnosis Date  . Arthritis   . Hypertension     Past Surgical History:  Procedure Laterality Date  . COLONOSCOPY  06/11/2013   [facility]  . TOE SURGERY    . tooth implant      There were no vitals filed for this visit.   Subjective Assessment - 06/30/20 1948    Subjective Pt reports she has not had any dizziness since previous session last week; still thinks it is related to tightness in her Rt upper trap    Diagnostic tests MRI x 2;  MRA    Patient Stated Goals get rid of my neck discomfort when walking and looking down    Currently in Pain? No/denies              TherEx; pt performed scapular strengthening exercises with use of green theraband for strengthening; rows with elbows flexed at 90 degrees 10 reps  Shoulder external rotation with elbows flexed at 90 degrees 10 reps - with green theraband  Shoulder horizontal abduction with elbows extended 5 reps; each diagonal 5 reps (pt in standing position)  Cervical retraction exercise reviewed  Modified push ups - wall push ups - 10 reps  "Y" and "w" stretch 30 sec hold x 1 rep each for postural retraining                           PT Short Term Goals - 06/04/20 1659      PT SHORT TERM GOAL #1   Title STG's = LTG's             PT Long Term Goals - 06/30/20 1954      PT LONG TERM GOAL #1   Title Pt will subjectively report at least 50% improvement in Rt upper trap tightness.    Time 4    Period Weeks    Status New      PT LONG TERM GOAL #2   Title Improve FOTO score by at least 10 points to demo improvement in function/ vertigo.    Time 4    Period Weeks    Status New      PT LONG TERM GOAL #3   Title Perform SOT and establish HEP as appropriate.    Time 4    Period Weeks    Status New      PT LONG TERM GOAL #4   Title Pt will report ability to look down at phone for at least 2" and not experience dizziness.    Time 4    Period Weeks  Status New      PT LONG TERM GOAL #5   Title Complete DHI and establish goal as appropriate.    Time 4    Period Weeks    Status New      PT LONG TERM GOAL #6   Title Independent in HEP for cervical stretches and vestibular exercises.    Time 4    Period Weeks    Status New                 Plan - 06/30/20 1950    Clinical Impression Statement Pt able to perform stretches and scapular/upper trap strengthening with minimal cues for correct technique.  Pt reported no dizziness during PT session today; pt states she feels her Rt upper trap is tight but is better than it was a few weeks ago.  Dry needling treatment discussed but pt requests to hold on scheduling this (is concerned about the pain produced with this treatment modality).  Pt is progressing well - may discharge next session if pt continues to not have vertigo.    Personal Factors and Comorbidities Comorbidity 2;Time since onset of injury/illness/exacerbation;Past/Current Experience    Comorbidities LBP without sciatica, hearing loss Rt ear, HTN, OA, VBI, cerebral microvascular disease    Examination-Activity Limitations Bend;Locomotion  Level;Stand;Transfers    Examination-Participation Restrictions Cleaning;Community Activity;Yard Work;Shop;Meal Prep    Stability/Clinical Decision Making Evolving/Moderate complexity    Rehab Potential Good    PT Frequency 2x / week    PT Duration 4 weeks    PT Treatment/Interventions ADLs/Self Care Home Management;Balance training;Neuromuscular re-education;Patient/family education;Therapeutic activities;Therapeutic exercise;Electrical Stimulation;Vestibular;Dry needling    PT Next Visit Plan D/C next session    PT [place] and Agree with Plan of Care Patient           Patient will benefit from skilled therapeutic intervention in order to improve the following deficits and impairments:  Dizziness,Decreased balance  Visit Diagnosis: Dizziness and giddiness  Cervicalgia     Problem List Patient Active Problem List   Diagnosis Date Noted  . Hyperlipidemia with target LDL less than 100 06/14/2020  . Vertigo 05/28/2020  . VBI (vertebrobasilar insufficiency) 04/13/2020  . Diuretic-induced hypokalemia 04/13/2020  . Cerebral microvascular disease 04/13/2020  . Chronic bilateral low back pain without sciatica 10/14/2019  . Pre-diabetes 10/14/2019  . Obesity 04/25/2019  . Essential hypertension 06/18/2014  . Osteoarthritis 06/18/2014  . Routine general medical examination at a health care facility 06/18/2014    Dana Small, PT 06/30/2020, 7:55 PM  [facility] [address] [place], [place], [address] Phone: [phone]   Fax:  [phone]  Name: Dana Small MRN: [id number] Date of Birth: [birth year]

## 2020-07-01 ENCOUNTER — Ambulatory Visit: Payer: Medicare Other | Admitting: Physical Therapy

## 2020-07-06 ENCOUNTER — Ambulatory Visit: Payer: Medicare Other | Admitting: Physical Therapy

## 2020-07-07 MED FILL — SHINGRIX 50 MCG SUS: 50 | 1 days supply | Qty: 1 | Fill #1

## 2020-07-08 ENCOUNTER — Encounter: Payer: Medicare Other | Admitting: Physical Therapy

## 2020-07-23 DIAGNOSIS — L821 Other seborrheic keratosis: Secondary | ICD-10-CM | POA: Diagnosis not present

## 2020-07-23 DIAGNOSIS — L578 Other skin changes due to chronic exposure to nonionizing radiation: Secondary | ICD-10-CM | POA: Diagnosis not present

## 2020-07-23 DIAGNOSIS — L219 Seborrheic dermatitis, unspecified: Secondary | ICD-10-CM | POA: Diagnosis not present

## 2020-07-23 DIAGNOSIS — L570 Actinic keratosis: Secondary | ICD-10-CM | POA: Diagnosis not present

## 2020-07-23 DIAGNOSIS — L603 Nail dystrophy: Secondary | ICD-10-CM | POA: Diagnosis not present

## 2020-09-24 ENCOUNTER — Other Ambulatory Visit: Payer: Self-pay | Admitting: Internal Medicine

## 2020-09-24 DIAGNOSIS — I10 Essential (primary) hypertension: Secondary | ICD-10-CM

## 2020-10-06 ENCOUNTER — Other Ambulatory Visit: Payer: Self-pay | Admitting: Internal Medicine

## 2020-10-06 ENCOUNTER — Telehealth: Payer: Self-pay | Admitting: Internal Medicine

## 2020-10-06 ENCOUNTER — Other Ambulatory Visit: Payer: Self-pay

## 2020-10-06 ENCOUNTER — Other Ambulatory Visit (HOSPITAL_BASED_OUTPATIENT_CLINIC_OR_DEPARTMENT_OTHER): Payer: Self-pay

## 2020-10-06 ENCOUNTER — Ambulatory Visit: Payer: Medicare Other | Attending: Internal Medicine

## 2020-10-06 DIAGNOSIS — Z23 Encounter for immunization: Secondary | ICD-10-CM

## 2020-10-06 DIAGNOSIS — I10 Essential (primary) hypertension: Secondary | ICD-10-CM

## 2020-10-06 MED ORDER — COVID-19 MRNA VACC (MODERNA) 100 MCG/0.5ML IM SUSP
INTRAMUSCULAR | 0 refills | Status: DC
  Filled 2020-10-06: qty 0.25, 1d supply, fill #0

## 2020-10-06 MED ORDER — IRBESARTAN 150 MG PO TABS: 150.0000 mg | ORAL_TABLET | Freq: Every day | ORAL | 0 refills | Status: DC

## 2020-10-06 NOTE — Telephone Encounter (Signed)
Medication has been sent to the patient's pharmacy.  

## 2020-10-06 NOTE — Progress Notes (Signed)
   Covid-19 Vaccination Clinic  Name:  Dana Small    MRN: [id number] DOB: 12-27-1945  10/06/2020  [name] was observed post Covid-19 immunization for 15 minutes without incident. She was provided with Vaccine Information Sheet and instruction to access the V-Safe system.   [name] was instructed to call 911 with any severe reactions post vaccine: Difficulty breathing  Swelling of face and throat  A fast heartbeat  A bad rash all over body  Dizziness and weakness   Immunizations Administered     Name Date Dose VIS Date Route   Moderna Covid-19 Booster Vaccine 10/06/2020  2:27 PM 0.25 mL 02/04/2020 Intramuscular   Manufacturer: Moderna   Lot: [id number]   [place]: [id number]

## 2020-10-06 NOTE — Telephone Encounter (Signed)
1.Medication Requested: irbesartan (AVAPRO) 150 MG tablet   2. Pharmacy (Name, Street, [place]): [facility] [phone] - [place], [place]  3. On Med List: yes   4. Last Visit with PCP: 04-02-20  5. Next visit date with PCP: 11-19-20   Agent: Please be advised that RX refills may take up to 3 business days. We ask that you follow-up with your pharmacy.

## 2020-10-07 ENCOUNTER — Other Ambulatory Visit (HOSPITAL_BASED_OUTPATIENT_CLINIC_OR_DEPARTMENT_OTHER): Payer: Self-pay

## 2020-10-20 ENCOUNTER — Telehealth: Payer: Self-pay | Admitting: Internal Medicine

## 2020-10-20 DIAGNOSIS — I10 Essential (primary) hypertension: Secondary | ICD-10-CM

## 2020-10-20 DIAGNOSIS — E876 Hypokalemia: Secondary | ICD-10-CM

## 2020-10-20 DIAGNOSIS — T502X5A Adverse effect of carbonic-anhydrase inhibitors, benzothiadiazides and other diuretics, initial encounter: Secondary | ICD-10-CM

## 2020-10-20 NOTE — Telephone Encounter (Signed)
1.Medication Requested: potassium chloride (KLOR-CON 10) 10 MEQ tablet   2. Pharmacy (Name, Street, [place]): [facility] [phone] - [place], [place] [address]  3. On Med List: yes   4. Last Visit with PCP: 04-02-20  5. Next visit date with PCP: 11-19-20  Patient is requesting a prescription be sent in for her to take one tablet daily, 10 MEG. Please advise    Agent: Please be advised that RX refills may take up to 3 business days. We ask that you follow-up with your pharmacy.

## 2020-10-21 ENCOUNTER — Other Ambulatory Visit: Payer: Self-pay | Admitting: Internal Medicine

## 2020-10-21 DIAGNOSIS — I10 Essential (primary) hypertension: Secondary | ICD-10-CM

## 2020-10-21 DIAGNOSIS — E876 Hypokalemia: Secondary | ICD-10-CM

## 2020-10-21 DIAGNOSIS — T502X5A Adverse effect of carbonic-anhydrase inhibitors, benzothiadiazides and other diuretics, initial encounter: Secondary | ICD-10-CM

## 2020-10-21 MED ORDER — POTASSIUM CHLORIDE ER 10 MEQ PO TBCR: 10.0000 meq | EXTENDED_RELEASE_TABLET | Freq: Two times a day (BID) | ORAL | 0 refills | Status: DC

## 2020-10-21 NOTE — Telephone Encounter (Signed)
Medication has been sent to the patient's pharmacy.  

## 2020-11-19 ENCOUNTER — Ambulatory Visit (INDEPENDENT_AMBULATORY_CARE_PROVIDER_SITE_OTHER): Payer: Medicare Other | Admitting: Internal Medicine

## 2020-11-19 ENCOUNTER — Other Ambulatory Visit: Payer: Self-pay

## 2020-11-19 ENCOUNTER — Ambulatory Visit: Payer: Medicare Other | Admitting: Internal Medicine

## 2020-11-19 ENCOUNTER — Encounter: Payer: Self-pay | Admitting: Internal Medicine

## 2020-11-19 VITALS — BP 124/88 | HR 70 | Temp 98.6°F | Resp 18 | Ht 64.0 in | Wt 184.0 lb

## 2020-11-19 DIAGNOSIS — E6609 Other obesity due to excess calories: Secondary | ICD-10-CM | POA: Diagnosis not present

## 2020-11-19 DIAGNOSIS — M19049 Primary osteoarthritis, unspecified hand: Secondary | ICD-10-CM | POA: Diagnosis not present

## 2020-11-19 DIAGNOSIS — Z6830 Body mass index (BMI) 30.0-30.9, adult: Secondary | ICD-10-CM

## 2020-11-19 DIAGNOSIS — I10 Essential (primary) hypertension: Secondary | ICD-10-CM | POA: Diagnosis not present

## 2020-11-19 LAB — COMPREHENSIVE METABOLIC PANEL
ALT: 24 U/L (ref 0–35)
AST: 24 U/L (ref 0–37)
Albumin: 4.2 g/dL (ref 3.5–5.2)
Alkaline Phosphatase: 77 U/L (ref 39–117)
BUN: 20 mg/dL (ref 6–23)
CO2: 32 mEq/L (ref 19–32)
Calcium: 9.4 mg/dL (ref 8.4–10.5)
Chloride: 100 mEq/L (ref 96–112)
Creatinine, Ser: 0.7 mg/dL (ref 0.40–1.20)
GFR: 84.98 mL/min (ref >60.00)
Glucose, Bld: 86 mg/dL (ref 70–99)
Potassium: 3.5 mEq/L (ref 3.5–5.1)
Sodium: 138 mEq/L (ref 135–145)
Total Bilirubin: 0.4 mg/dL (ref 0.2–1.2)
Total Protein: 7.4 g/dL (ref 6.0–8.3)

## 2020-11-19 NOTE — Patient Instructions (Addendum)
I think it is okay to stop the hydrochlorothiazide and monitor the blood pressure. The goal is <140/90.

## 2020-11-19 NOTE — Assessment & Plan Note (Signed)
Checking CMP and adjust potassium as needed. She has been taking 10 mEq daily. She is taking hctz 12.5 mg daily and irbesartan 150 mg daily. She is wanting to try to stop hctz 12.5 mg daily and monitor BP which I am okay with. She did stop this a week without difficulty. BP goal <140/90.

## 2020-11-19 NOTE — Progress Notes (Addendum)
   Subjective:   Patient ID: Dana Small, female    DOB: 07-08-1945, 75 y.o.   MRN: [id number]  HPI The patient is a 75 YO female coming in for follow up medical conditions.   Review of Systems  Constitutional: Negative.   HENT: Negative.    Eyes: Negative.   Respiratory:  Negative for cough, chest tightness and shortness of breath.   Cardiovascular:  Negative for chest pain, palpitations and leg swelling.  Gastrointestinal:  Negative for abdominal distention, abdominal pain, constipation, diarrhea, nausea and vomiting.  Musculoskeletal:  Positive for arthralgias.  Skin: Negative.   Neurological: Negative.   Psychiatric/Behavioral: Negative.     Objective:  Physical Exam Constitutional:      Appearance: She is well-developed.  HENT:     Head: Normocephalic and atraumatic.  Cardiovascular:     Rate and Rhythm: Normal rate and regular rhythm.  Pulmonary:     Effort: Pulmonary effort is normal. No respiratory distress.     Breath sounds: Normal breath sounds. No wheezing or rales.  Abdominal:     General: Bowel sounds are normal. There is no distension.     Palpations: Abdomen is soft.     Tenderness: There is no abdominal tenderness. There is no rebound.  Musculoskeletal:     Cervical back: Normal range of motion.     Comments: Some soreness and stiffness in the hands  Skin:    General: Skin is warm and dry.  Neurological:     Mental Status: She is alert and oriented to person, place, and time.     Coordination: Coordination normal.    Vitals:   11/19/20 1400  BP: 124/88  Pulse: 70  Resp: 18  Temp: 98.6 F (37 C)  TempSrc: Oral  SpO2: 96%  Weight: 184 lb (83.5 kg)  Height: '5\' 4"'$  (1.626 m)    This visit occurred during the SARS-CoV-2 public health emergency.  Safety protocols were in place, including screening questions prior to the visit, additional usage of staff PPE, and extensive cleaning of exam room while observing appropriate contact time as indicated for  disinfecting solutions.   Assessment & Plan:  Prevnar 20 given at visit

## 2020-11-19 NOTE — Assessment & Plan Note (Signed)
Some more nodules on the hands bilaterally and uses tylenol otc for pain if needed. Doing exercise which is helping.

## 2020-11-19 NOTE — Assessment & Plan Note (Signed)
Considering healthy weight and wellness clinic and not sure if she wants to pursue this. Doing [facility] currently.

## 2020-11-24 ENCOUNTER — Other Ambulatory Visit: Payer: Self-pay | Admitting: Internal Medicine

## 2020-11-24 DIAGNOSIS — I10 Essential (primary) hypertension: Secondary | ICD-10-CM

## 2020-11-24 DIAGNOSIS — T502X5A Adverse effect of carbonic-anhydrase inhibitors, benzothiadiazides and other diuretics, initial encounter: Secondary | ICD-10-CM

## 2020-11-24 DIAGNOSIS — E876 Hypokalemia: Secondary | ICD-10-CM

## 2020-12-06 ENCOUNTER — Other Ambulatory Visit: Payer: Self-pay | Admitting: Internal Medicine

## 2020-12-06 DIAGNOSIS — I10 Essential (primary) hypertension: Secondary | ICD-10-CM

## 2020-12-23 ENCOUNTER — Other Ambulatory Visit: Payer: Self-pay | Admitting: Internal Medicine

## 2020-12-23 DIAGNOSIS — I10 Essential (primary) hypertension: Secondary | ICD-10-CM

## 2021-01-11 ENCOUNTER — Other Ambulatory Visit (HOSPITAL_BASED_OUTPATIENT_CLINIC_OR_DEPARTMENT_OTHER): Payer: Self-pay

## 2021-01-11 ENCOUNTER — Ambulatory Visit: Payer: Medicare Other | Attending: Internal Medicine

## 2021-01-11 DIAGNOSIS — Z23 Encounter for immunization: Secondary | ICD-10-CM

## 2021-01-11 MED ORDER — PFIZER COVID-19 VAC BIVALENT 30 MCG/0.3ML IM SUSP
INTRAMUSCULAR | 0 refills | Status: DC
  Filled 2021-01-11: qty 0.3, 1d supply, fill #0

## 2021-01-11 NOTE — Progress Notes (Signed)
   Covid-19 Vaccination Clinic  Name:  Dana Small    MRN: [id number] DOB: 05/11/1945  01/11/2021  Dana Small was observed post Covid-19 immunization for 15 minutes without incident. She was provided with Vaccine Information Sheet and instruction to access the V-Safe system.   Dana Small was instructed to call 911 with any severe reactions post vaccine: Difficulty breathing  Swelling of face and throat  A fast heartbeat  A bad rash all over body  Dizziness and weakness

## 2021-01-21 DIAGNOSIS — M5030 Other cervical disc degeneration, unspecified cervical region: Secondary | ICD-10-CM | POA: Diagnosis not present

## 2021-01-21 DIAGNOSIS — D692 Other nonthrombocytopenic purpura: Secondary | ICD-10-CM | POA: Diagnosis not present

## 2021-01-21 DIAGNOSIS — L570 Actinic keratosis: Secondary | ICD-10-CM | POA: Diagnosis not present

## 2021-01-21 DIAGNOSIS — L820 Inflamed seborrheic keratosis: Secondary | ICD-10-CM | POA: Diagnosis not present

## 2021-01-21 DIAGNOSIS — R202 Paresthesia of skin: Secondary | ICD-10-CM | POA: Diagnosis not present

## 2021-01-21 DIAGNOSIS — Z23 Encounter for immunization: Secondary | ICD-10-CM | POA: Diagnosis not present

## 2021-01-24 ENCOUNTER — Other Ambulatory Visit (HOSPITAL_BASED_OUTPATIENT_CLINIC_OR_DEPARTMENT_OTHER): Payer: Self-pay

## 2021-01-24 MED ORDER — INFLUENZA VAC A&B SA ADJ QUAD 0.5 ML IM PRSY
PREFILLED_SYRINGE | INTRAMUSCULAR | 0 refills | Status: DC
  Filled 2021-01-24: qty 0.5, 1d supply, fill #0

## 2021-01-31 ENCOUNTER — Other Ambulatory Visit: Payer: Self-pay | Admitting: Internal Medicine

## 2021-01-31 DIAGNOSIS — Z1231 Encounter for screening mammogram for malignant neoplasm of breast: Secondary | ICD-10-CM

## 2021-03-02 ENCOUNTER — Other Ambulatory Visit: Payer: Self-pay

## 2021-03-02 ENCOUNTER — Ambulatory Visit
Admission: RE | Admit: 2021-03-02 | Discharge: 2021-03-02 | Disposition: A | Discharge status: Home / Self Care | Payer: Medicare Other | Source: Ambulatory Visit | Attending: Internal Medicine | Admitting: Internal Medicine

## 2021-03-02 DIAGNOSIS — Z1231 Encounter for screening mammogram for malignant neoplasm of breast: Secondary | ICD-10-CM | POA: Diagnosis not present

## 2021-03-02 IMAGING — MG MM DIGITAL SCREENING BILAT W/ TOMO AND CAD
8 series · 8 of 24 positions shown · non-contrast
Comparison: Previous exam(s).

CLINICAL DATA: Screening.

EXAM:
DIGITAL SCREENING BILATERAL MAMMOGRAM WITH TOMOSYNTHESIS AND CAD
TECHNIQUE: Bilateral screening digital craniocaudal and mediolateral oblique
mammograms were obtained. Bilateral screening digital breast
tomosynthesis was performed. The images were evaluated with
computer-aided detection.

[L CC synth-2D]
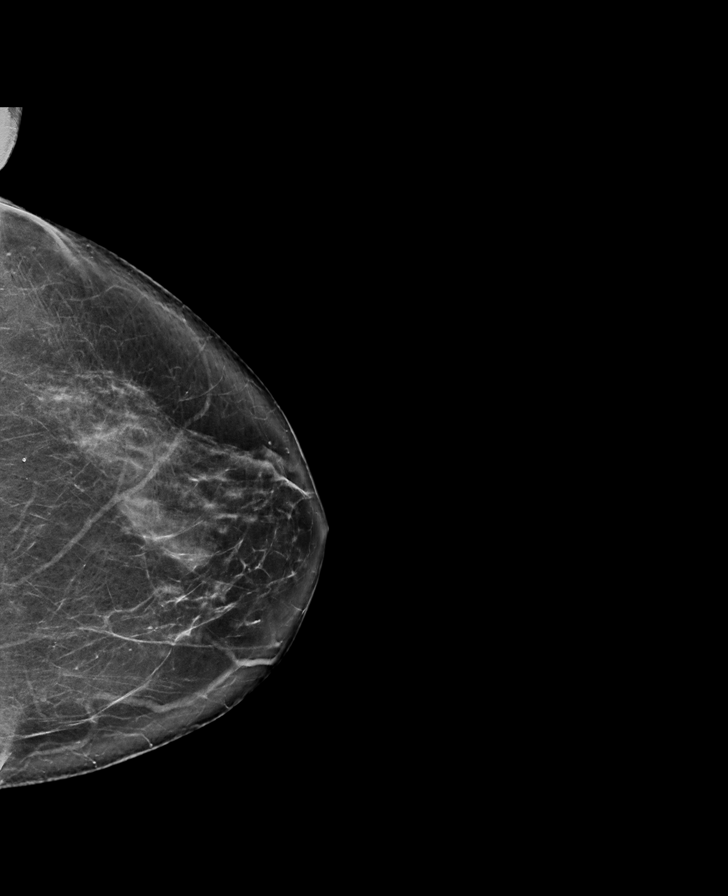

[R CC synth-2D]
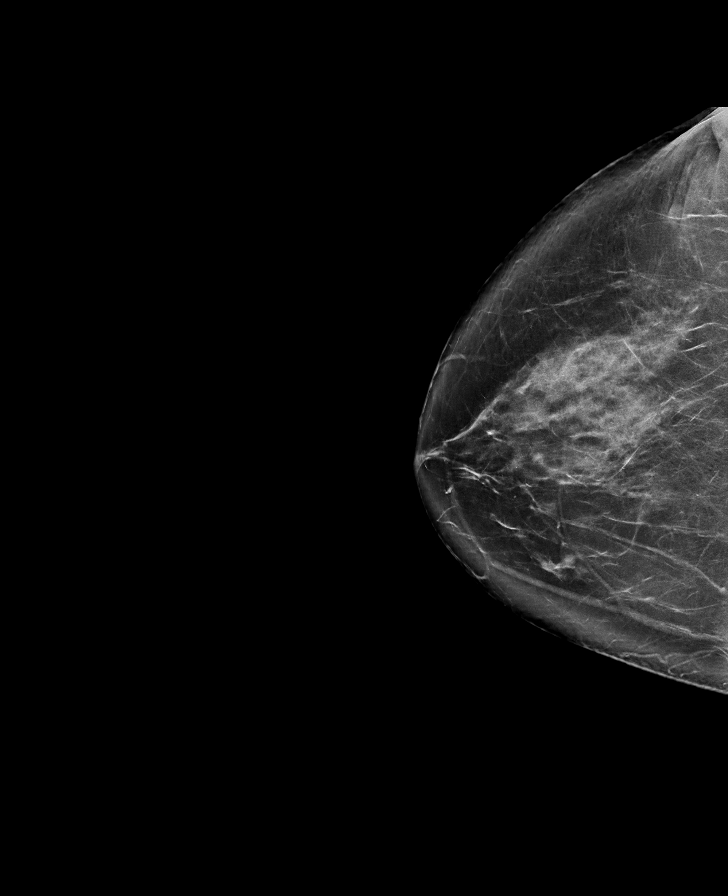

[L MLO synth-2D]
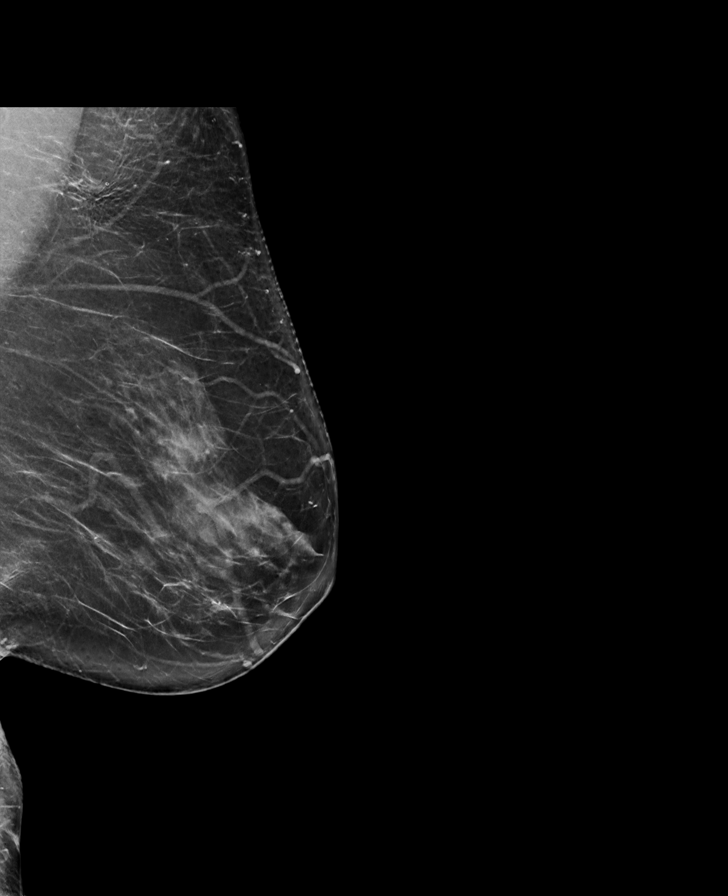

[R MLO synth-2D]
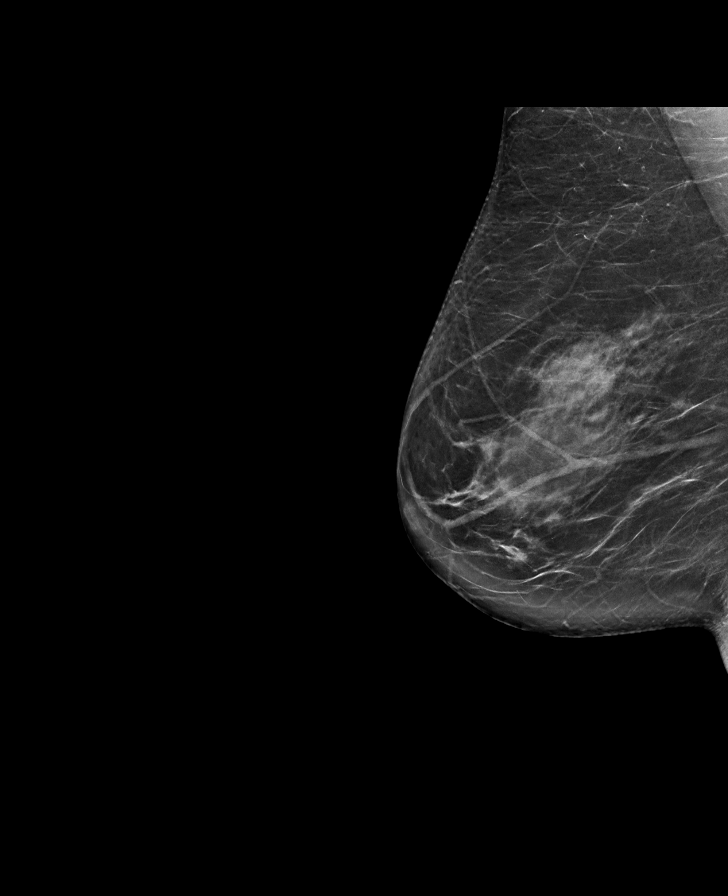

[L CC tomo · tomo slice 41/80.0]
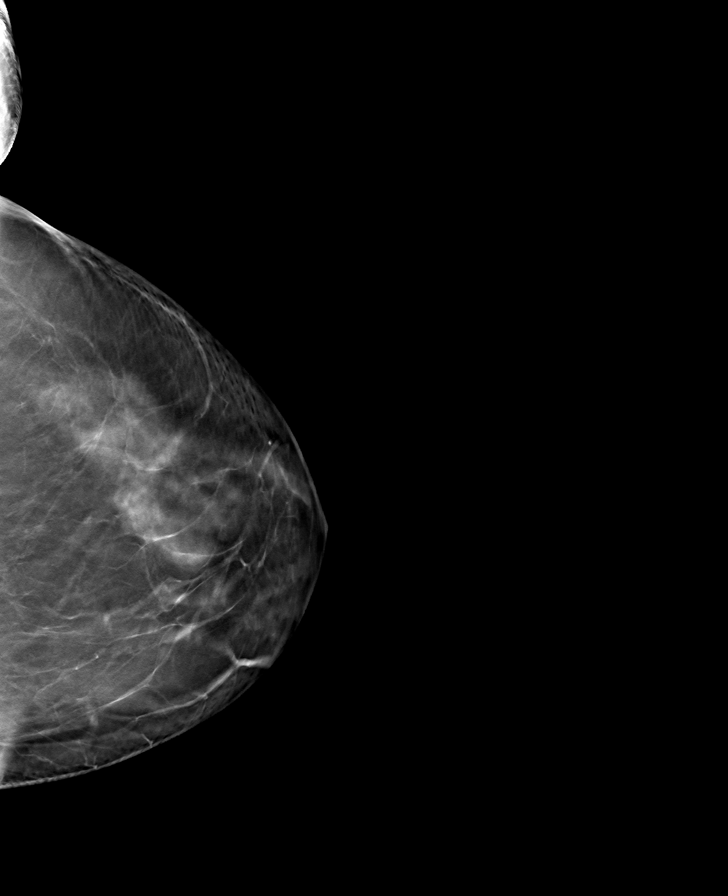

[R CC tomo · tomo slice 41/80.0]
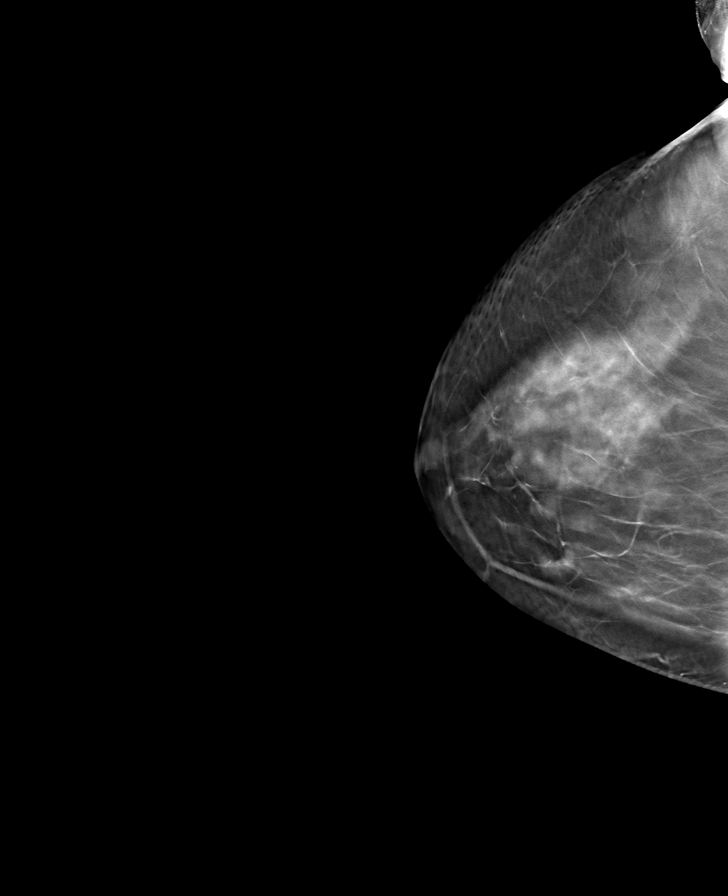

[R MLO tomo · tomo slice 40/79.0]
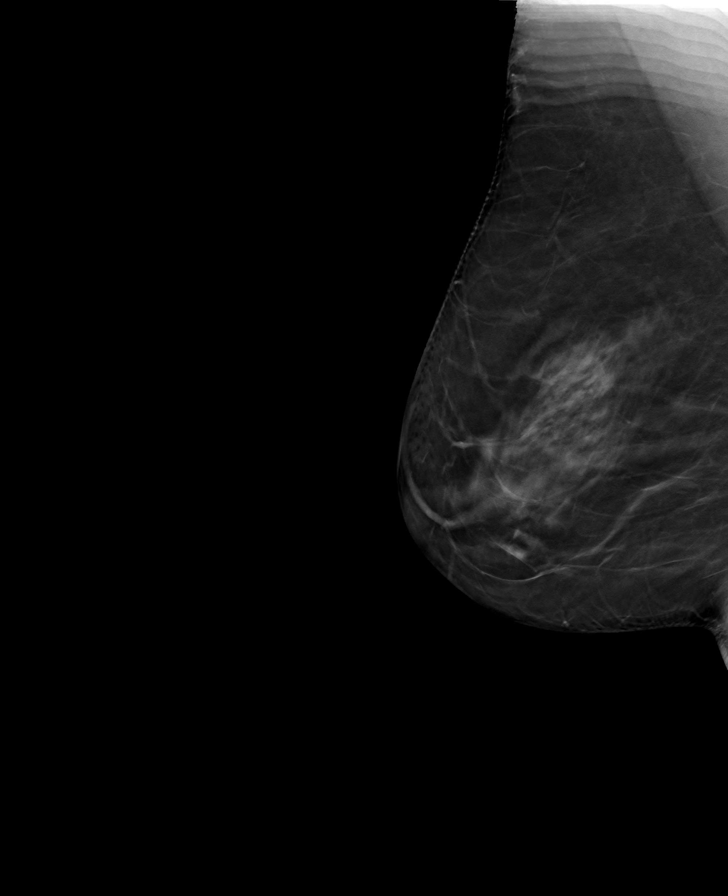

[L MLO tomo · tomo slice 41/80.0]
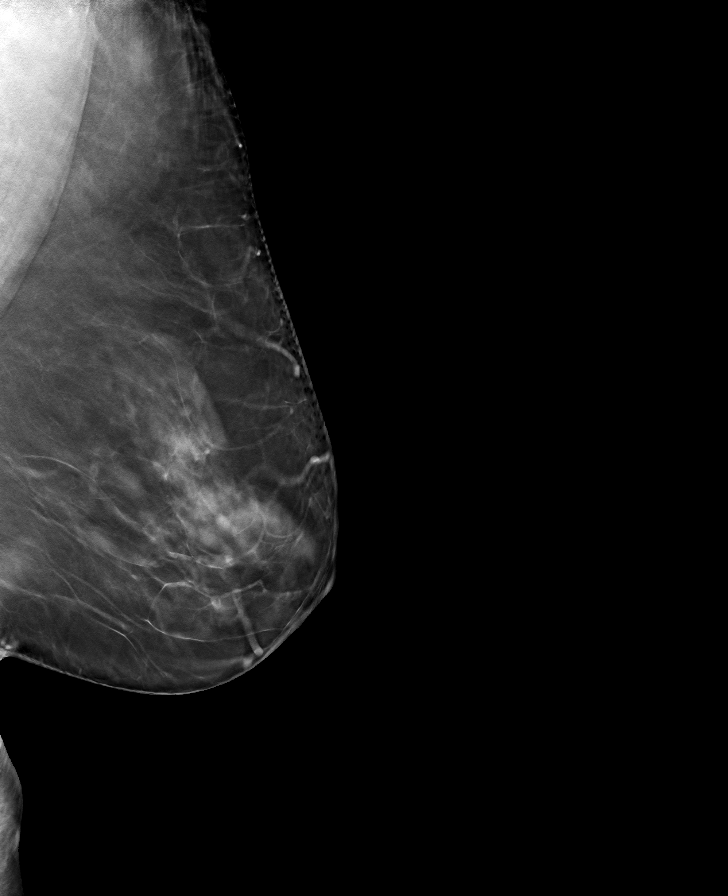

[8 of 24 positions shown; findings below may reference images not displayed]

ACR Breast Density Category c: The breast tissue is heterogeneously
dense, which may obscure small masses.
FINDINGS: In the right breast, a possible asymmetry warrants further
evaluation. In the left breast, no findings suspicious for
malignancy.
IMPRESSION: Further evaluation is suggested for possible asymmetry in the right
breast.

RECOMMENDATION:
Diagnostic mammogram and possibly ultrasound of the right breast.
(Code:[AN])

The patient will be contacted regarding the findings, and additional
imaging will be scheduled.

BI-RADS CATEGORY  0: Incomplete. Need additional imaging evaluation
and/or prior mammograms for comparison.

## 2021-03-03 ENCOUNTER — Other Ambulatory Visit: Payer: Self-pay | Admitting: Internal Medicine

## 2021-03-03 DIAGNOSIS — R928 Other abnormal and inconclusive findings on diagnostic imaging of breast: Secondary | ICD-10-CM

## 2021-03-23 ENCOUNTER — Other Ambulatory Visit: Payer: Self-pay | Admitting: Internal Medicine

## 2021-03-28 ENCOUNTER — Ambulatory Visit
Admission: RE | Admit: 2021-03-28 | Discharge: 2021-03-28 | Disposition: A | Discharge status: Home / Self Care | Payer: Medicare Other | Source: Ambulatory Visit | Attending: Internal Medicine | Admitting: Internal Medicine

## 2021-03-28 ENCOUNTER — Ambulatory Visit: Payer: Medicare Other

## 2021-03-28 ENCOUNTER — Other Ambulatory Visit: Payer: Self-pay

## 2021-03-28 DIAGNOSIS — R928 Other abnormal and inconclusive findings on diagnostic imaging of breast: Secondary | ICD-10-CM

## 2021-03-28 DIAGNOSIS — R922 Inconclusive mammogram: Secondary | ICD-10-CM | POA: Diagnosis not present

## 2021-03-28 IMAGING — MG MM DIGITAL DIAGNOSTIC UNILAT*R* W/ TOMO W/ CAD
6 of 10 series · 6 of 30 positions shown · non-contrast
Comparison: Previous exams including recent screening mammogram
dated [DATE].

CLINICAL DATA: Patient returns today to evaluate a possible RIGHT
breast asymmetry questioned on recent screening mammogram.

EXAM:
DIGITAL DIAGNOSTIC UNILATERAL RIGHT MAMMOGRAM WITH TOMOSYNTHESIS AND
CAD
TECHNIQUE: Right digital diagnostic mammography and breast tomosynthesis was
performed. The images were evaluated with computer-aided detection.

[R CC synth-2D (1 of 3)]
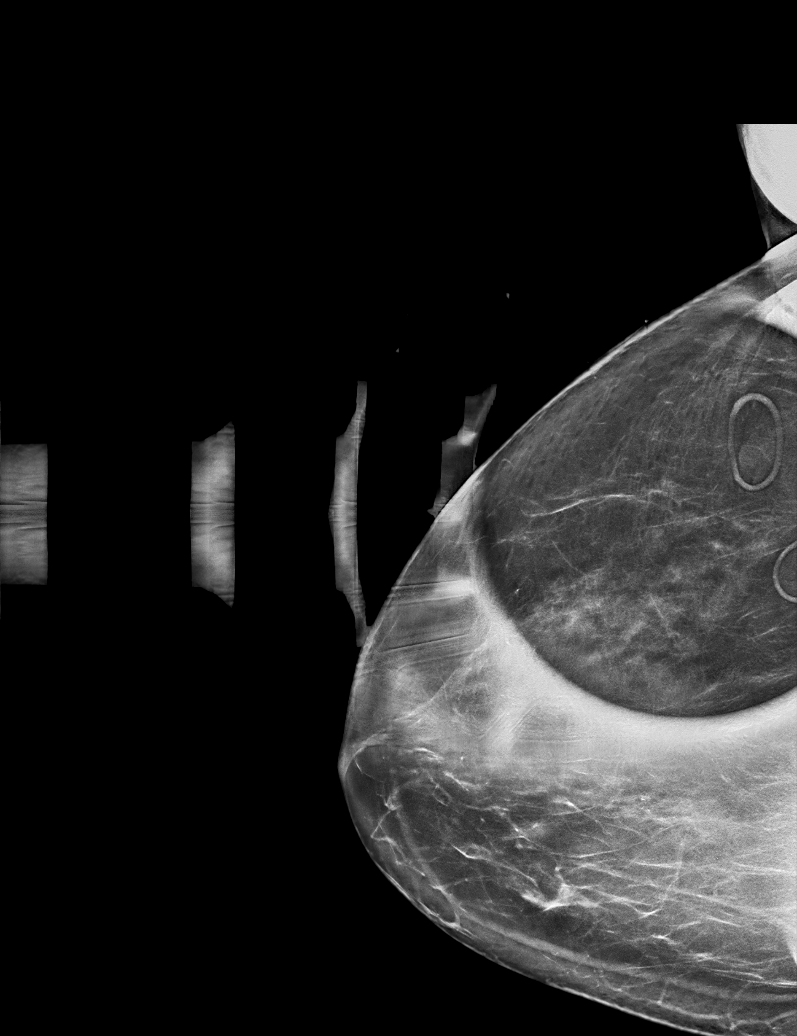

[R MLO synth-2D (1 of 2)]
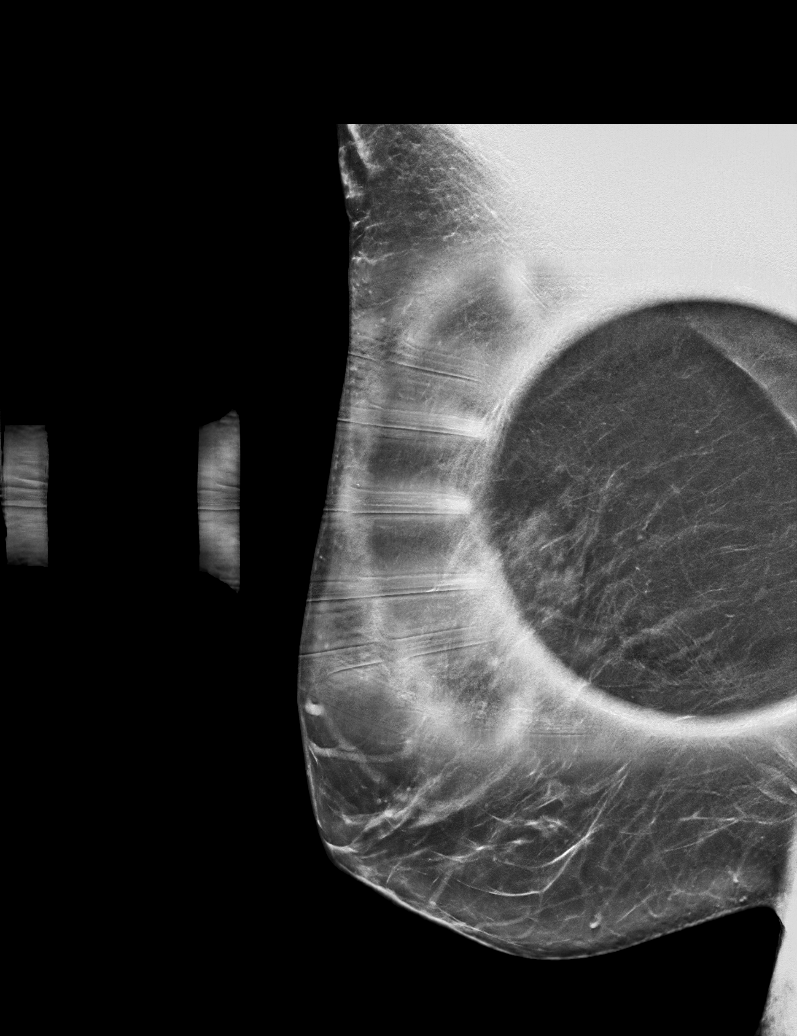

[R CC synth-2D (2 of 3)]
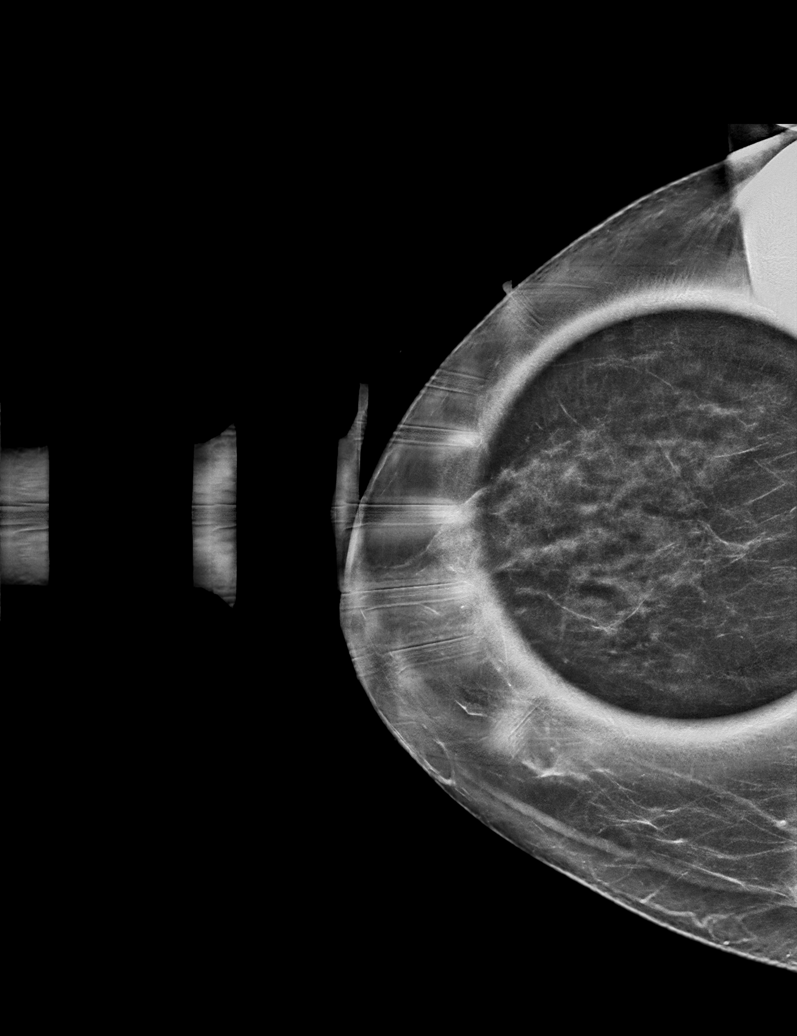

[R MLO synth-2D (2 of 2)]
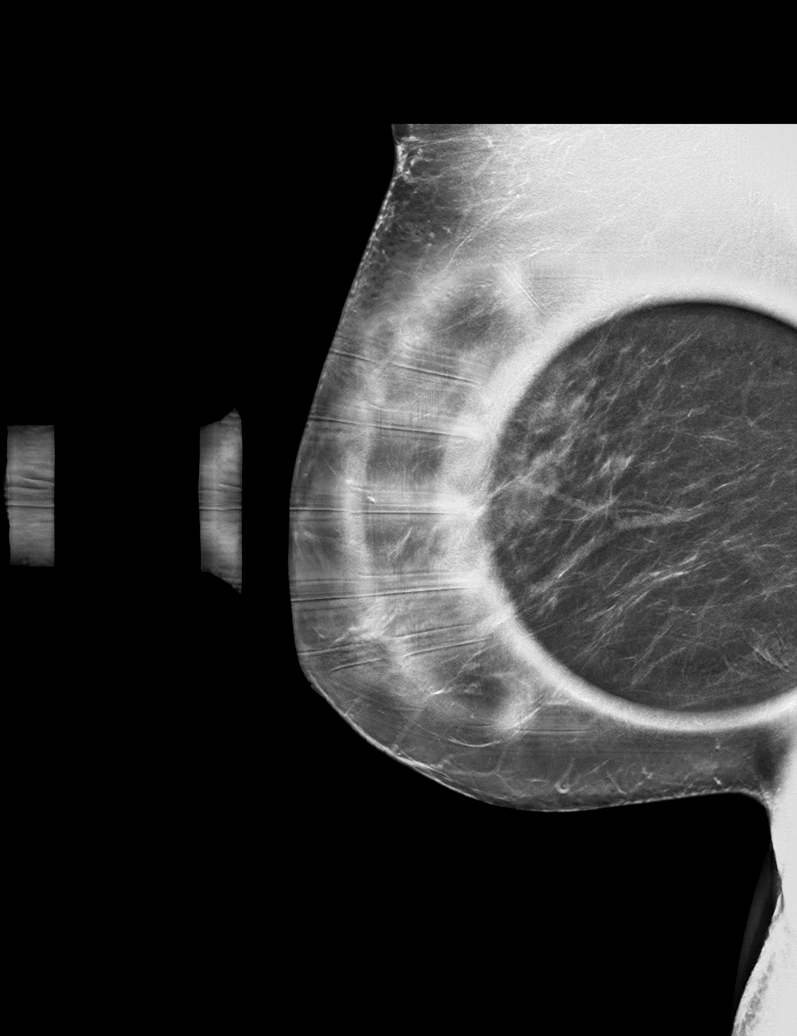

[R CC synth-2D (3 of 3)]
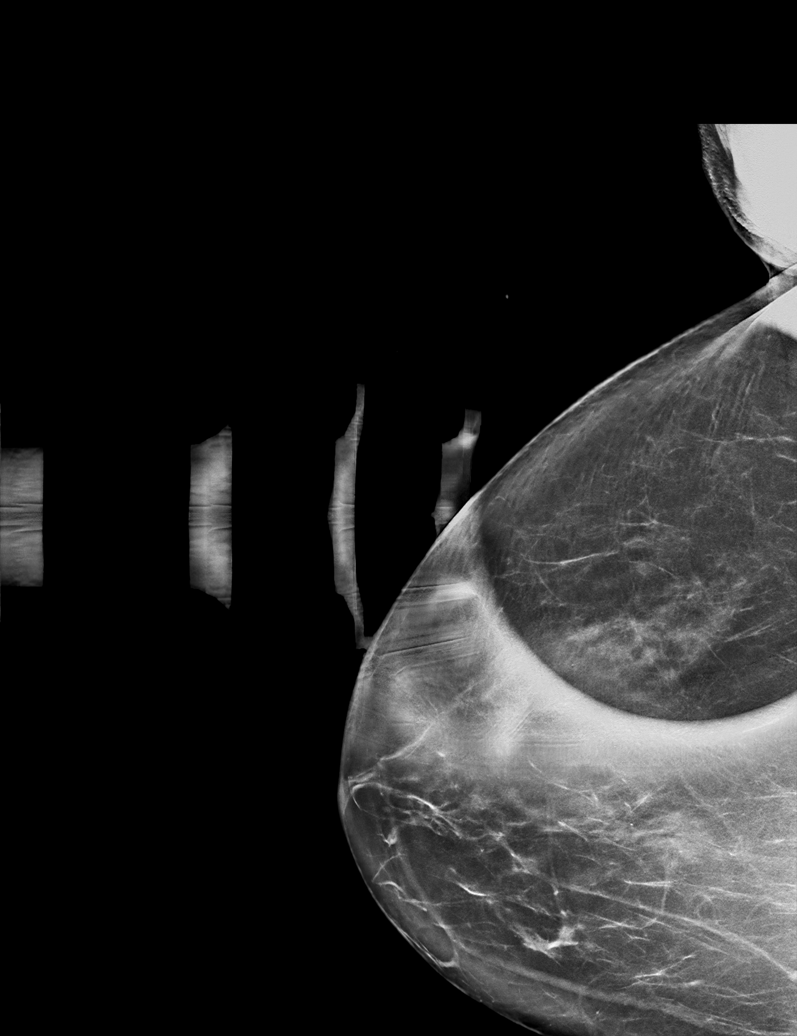

[R MLO tomo · tomo slice 36/71.0]
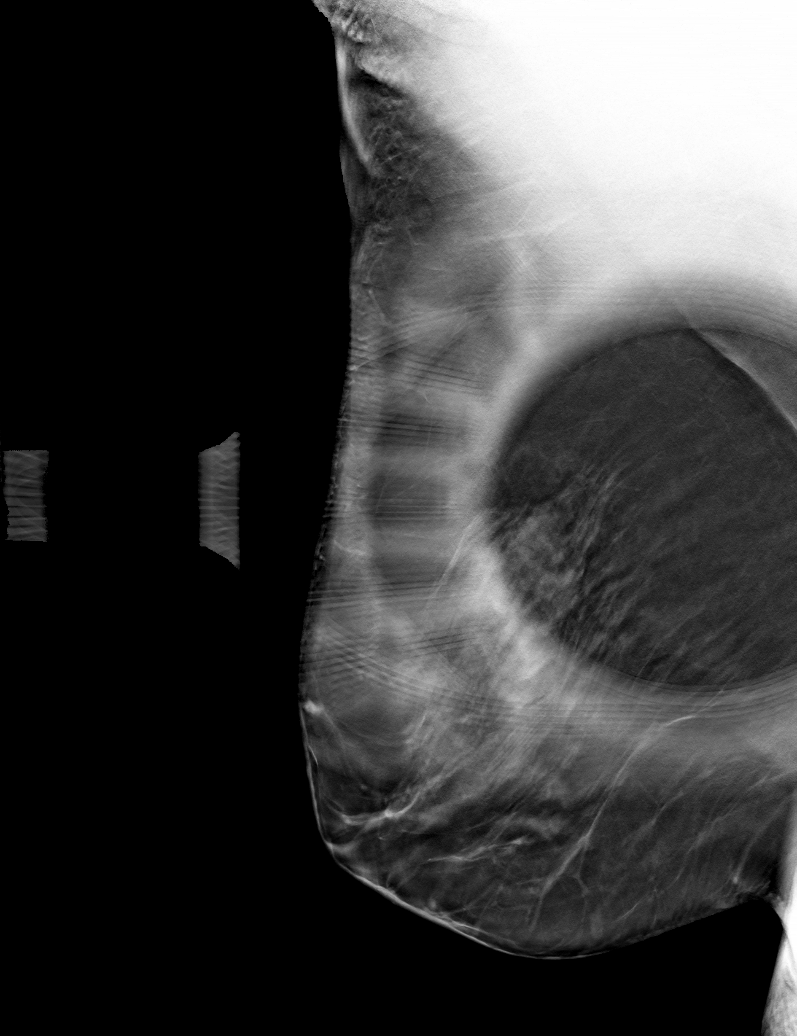

[6 of 30 positions shown; findings below may reference images not displayed]

ACR Breast Density Category c: The breast tissue is heterogeneously
dense, which may obscure small masses.
FINDINGS: On today's additional diagnostic views, the questioned asymmetry
within the outer RIGHT breast is confirmed to be a mole on the skin,
with overlying skin marker in place. The additional questioned
asymmetry within the posterior retroareolar RIGHT breast is resolved
with additional diagnostic views indicating superimposition of
normal dense fibroglandular tissues.
IMPRESSION: No evidence of malignancy.

Patient may return to routine annual bilateral screening mammogram
schedule.

RECOMMENDATION:
Screening mammogram in one year.(Code:[NS])

I have discussed the findings and recommendations with the patient.
If applicable, a reminder letter will be sent to the patient
regarding the next appointment.

BI-RADS CATEGORY  2: Benign.

## 2021-04-05 ENCOUNTER — Other Ambulatory Visit: Payer: Medicare Other

## 2021-05-20 DIAGNOSIS — H2513 Age-related nuclear cataract, bilateral: Secondary | ICD-10-CM | POA: Diagnosis not present

## 2021-05-20 DIAGNOSIS — H52203 Unspecified astigmatism, bilateral: Secondary | ICD-10-CM | POA: Diagnosis not present

## 2021-05-20 DIAGNOSIS — H5213 Myopia, bilateral: Secondary | ICD-10-CM | POA: Diagnosis not present

## 2021-05-30 ENCOUNTER — Other Ambulatory Visit: Payer: Self-pay

## 2021-05-30 ENCOUNTER — Ambulatory Visit: Payer: Medicare Other

## 2021-06-07 ENCOUNTER — Ambulatory Visit (INDEPENDENT_AMBULATORY_CARE_PROVIDER_SITE_OTHER): Payer: Medicare Other

## 2021-06-07 ENCOUNTER — Other Ambulatory Visit: Payer: Self-pay

## 2021-06-07 ENCOUNTER — Other Ambulatory Visit (HOSPITAL_BASED_OUTPATIENT_CLINIC_OR_DEPARTMENT_OTHER): Payer: Self-pay

## 2021-06-07 VITALS — BP 120/60 | HR 58 | Temp 98.1°F | Resp 16 | Ht 64.0 in | Wt 169.0 lb

## 2021-06-07 DIAGNOSIS — Z0000 Encounter for general adult medical examination without abnormal findings: Secondary | ICD-10-CM | POA: Diagnosis not present

## 2021-06-07 NOTE — Patient Instructions (Addendum)
Dana Small , Thank you for taking time to come for your Medicare Wellness Visit. I appreciate your ongoing commitment to your health goals. Please review the following plan we discussed and let me know if I can assist you in the future.   Screening recommendations/referrals: Colonoscopy: 02/17/2019; due every 5 years Mammogram: 03/28/2021; due every year Bone Density: 07/29/2019; due every 2-3 years Recommended yearly ophthalmology/optometry visit for glaucoma screening and checkup Recommended yearly dental visit for hygiene and checkup  Vaccinations: Influenza vaccine: 01/24/2021 Pneumococcal vaccine: 10/11/2015 Tdap vaccine: 03/20/2016; due every 10 years Shingles vaccine: 03/25/2020, 07/09/2020   Covid-19: 05/13/2019, 06/10/2019, 02/20/2020  Advanced directives: Please bring a copy of your health care power of attorney and living will to the office at your convenience.  Conditions/risks identified: Reviewed health maintenance screenings with patient today and relevant education, vaccines, and/or referrals were provided.    Continue to eat heart healthy diet (full of fruits, vegetables, whole grains, lean protein, water--limit salt, fat, and sugar intake) and increase physical activity as tolerated.   Continue doing brain stimulating activities (puzzles, reading, adult coloring books, staying active) to keep memory sharp.   Next appointment: 06/10/2022 at 2:20 p.m. office visit with Mignon Pine, [place].  If need to reschedule or cancel please call [phone].   Preventive Care 89 Years and Older, Female Preventive care refers to lifestyle choices and visits with your health care provider that can promote health and wellness. What does preventive care include? A yearly physical exam. This is also called an annual well check. Dental exams once or twice a year. Routine eye exams. Ask your health care provider how often you should have your eyes checked. Personal lifestyle choices,  including: Daily care of your teeth and gums. Regular physical activity. Eating a healthy diet. Avoiding tobacco and drug use. Limiting alcohol use. Practicing safe sex. Taking low-dose aspirin every day. Taking vitamin and mineral supplements as recommended by your health care provider. What happens during an annual well check? The services and screenings done by your health care provider during your annual well check will depend on your age, overall health, lifestyle risk factors, and family history of disease. Counseling  Your health care provider may ask you questions about your: Alcohol use. Tobacco use. Drug use. Emotional well-being. Home and relationship well-being. Sexual activity. Eating habits. History of falls. Memory and ability to understand (cognition). Work and work Statistician. Reproductive health. Screening  You may have the following tests or measurements: Height, weight, and BMI. Blood pressure. Lipid and cholesterol levels. These may be checked every 5 years, or more frequently if you are over 20 years old. Skin check. Lung cancer screening. You may have this screening every year starting at age 17 if you have a 30-pack-year history of smoking and currently smoke or have quit within the past 15 years. Fecal occult blood test (FOBT) of the stool. You may have this test every year starting at age 67. Flexible sigmoidoscopy or colonoscopy. You may have a sigmoidoscopy every 5 years or a colonoscopy every 10 years starting at age 87. Hepatitis C blood test. Hepatitis B blood test. Sexually transmitted disease (STD) testing. Diabetes screening. This is done by checking your blood sugar (glucose) after you have not eaten for a while (fasting). You may have this done every 1-3 years. Bone density scan. This is done to screen for osteoporosis. You may have this done starting at age 55. Mammogram. This may be done every 1-2 years. Talk to your health  care provider  about how often you should have regular mammograms. Talk with your health care provider about your test results, treatment options, and if necessary, the need for more tests. Vaccines  Your health care provider may recommend certain vaccines, such as: Influenza vaccine. This is recommended every year. Tetanus, diphtheria, and acellular pertussis (Tdap, Td) vaccine. You may need a Td booster every 10 years. Zoster vaccine. You may need this after age 42. Pneumococcal 13-valent conjugate (PCV13) vaccine. One dose is recommended after age 42. Pneumococcal polysaccharide (PPSV23) vaccine. One dose is recommended after age 54. Talk to your health care provider about which screenings and vaccines you need and how often you need them. This information is not intended to replace advice given to you by your health care provider. Make sure you discuss any questions you have with your health care provider. Document Released: 04/30/2015 Document Revised: 12/22/2015 Document Reviewed: 02/02/2015 Elsevier Interactive Patient Education  2017 [employer] Prevention in the Home Falls can cause injuries. They can happen to people of all ages. There are many things you can do to make your home safe and to help prevent falls. What can I do on the outside of my home? Regularly fix the edges of walkways and driveways and fix any cracks. Remove anything that might make you trip as you walk through a door, such as a raised step or threshold. Trim any bushes or trees on the path to your home. Use bright outdoor lighting. Clear any walking paths of anything that might make someone trip, such as rocks or tools. Regularly check to see if handrails are loose or broken. Make sure that both sides of any steps have handrails. Any raised decks and porches should have guardrails on the edges. Have any leaves, snow, or ice cleared regularly. Use sand or salt on walking paths during winter. Clean up any spills in  your garage right away. This includes oil or grease spills. What can I do in the bathroom? Use night lights. Install grab bars by the toilet and in the tub and shower. Do not use towel bars as grab bars. Use non-skid mats or decals in the tub or shower. If you need to sit down in the shower, use a plastic, non-slip stool. Keep the floor dry. Clean up any water that spills on the floor as soon as it happens. Remove soap buildup in the tub or shower regularly. Attach bath mats securely with double-sided non-slip rug tape. Do not have throw rugs and other things on the floor that can make you trip. What can I do in the bedroom? Use night lights. Make sure that you have a light by your bed that is easy to reach. Do not use any sheets or blankets that are too big for your bed. They should not hang down onto the floor. Have a firm chair that has side arms. You can use this for support while you get dressed. Do not have throw rugs and other things on the floor that can make you trip. What can I do in the kitchen? Clean up any spills right away. Avoid walking on wet floors. Keep items that you use a lot in easy-to-reach places. If you need to reach something above you, use a strong step stool that has a grab bar. Keep electrical cords out of the way. Do not use floor polish or wax that makes floors slippery. If you must use wax, use non-skid floor wax. Do not have throw  rugs and other things on the floor that can make you trip. What can I do with my stairs? Do not leave any items on the stairs. Make sure that there are handrails on both sides of the stairs and use them. Fix handrails that are broken or loose. Make sure that handrails are as long as the stairways. Check any carpeting to make sure that it is firmly attached to the stairs. Fix any carpet that is loose or worn. Avoid having throw rugs at the top or bottom of the stairs. If you do have throw rugs, attach them to the floor with carpet  tape. Make sure that you have a light switch at the top of the stairs and the bottom of the stairs. If you do not have them, ask someone to add them for you. What else can I do to help prevent falls? Wear shoes that: Do not have high heels. Have rubber bottoms. Are comfortable and fit you well. Are closed at the toe. Do not wear sandals. If you use a stepladder: Make sure that it is fully opened. Do not climb a closed stepladder. Make sure that both sides of the stepladder are locked into place. Ask someone to hold it for you, if possible. Clearly mark and make sure that you can see: Any grab bars or handrails. First and last steps. Where the edge of each step is. Use tools that help you move around (mobility aids) if they are needed. These include: Canes. Walkers. Scooters. Crutches. Turn on the lights when you go into a dark area. Replace any light bulbs as soon as they burn out. Set up your furniture so you have a clear path. Avoid moving your furniture around. If any of your floors are uneven, fix them. If there are any pets around you, be aware of where they are. Review your medicines with your doctor. Some medicines can make you feel dizzy. This can increase your chance of falling. Ask your doctor what other things that you can do to help prevent falls. This information is not intended to replace advice given to you by your health care provider. Make sure you discuss any questions you have with your health care provider. Document Released: 01/28/2009 Document Revised: 09/09/2015 Document Reviewed: 05/08/2014 Elsevier Interactive Patient Education  2017 [employer].

## 2021-06-07 NOTE — Progress Notes (Addendum)
Subjective:   Dana Small is a 76 y.o. female who presents for Medicare Annual (Subsequent) preventive examination.  Review of Systems     Cardiac Risk Factors include: advanced age (>66men, >70 women);family history of premature cardiovascular disease;hypertension;dyslipidemia     Objective:    Today's Vitals   06/07/21 1546  BP: 120/60  Pulse: (!) 58  Resp: 16  Temp: 98.1 F (36.7 C)  TempSrc: Oral  SpO2: 97%  Weight: 169 lb (76.7 kg)  Height: 5\' 4"  (1.626 m)  PainSc: 0-No pain   Body mass index is 29.01 kg/m.  Advanced Directives 06/07/2021 06/04/2020 05/05/2020 07/03/2014  Does Patient Have a Medical Advance Directive? Yes Yes Yes No  Type of Advance Directive Living will;Healthcare Power of Attorney Living will [place];Living will -  Does patient want to make changes to medical advance directive? No - Patient declined No - Patient declined No - Patient declined -  Copy of [place] in Chart? No - copy requested - No - copy requested -  Would patient like information on creating a medical advance directive? - - - Yes - Educational materials given    Current Medications (verified) Outpatient Encounter Medications as of 06/07/2021  Medication Sig   irbesartan (AVAPRO) 150 MG tablet TAKE 1 TABLET(150 MG) BY MOUTH DAILY   pravastatin (PRAVACHOL) 20 MG tablet TAKE 1 TABLET(20 MG) BY MOUTH DAILY   COVID-19 mRNA bivalent vaccine, Pfizer, (PFIZER COVID-19 VAC BIVALENT) injection Inject into the muscle. (Patient not taking: Reported on 06/07/2021)   diazepam (VALIUM) 2 MG tablet Take 1 tablet (2 mg total) by mouth every 8 (eight) hours as needed for anxiety. (Patient not taking: Reported on 11/19/2020)   hydrochlorothiazide (MICROZIDE) 12.5 MG capsule TAKE 1 CAPSULE(12.5 MG) BY MOUTH DAILY (Patient not taking: Reported on 06/07/2021)   influenza vaccine adjuvanted (FLUAD) 0.5 ML injection Inject into the muscle.   Multiple Vitamins-Minerals  (MULTIVITAMIN ADULT PO) Take by mouth daily. (Patient not taking: Reported on 06/07/2021)   potassium chloride (KLOR-CON) 10 MEQ tablet TAKE 1 TABLET(10 MEQ) BY MOUTH TWICE DAILY (Patient not taking: Reported on 06/07/2021)   No facility-administered encounter medications on file as of 06/07/2021.    Allergies (verified) Amlodipine   History: Past Medical History:  Diagnosis Date   Arthritis    Hypertension    Past Surgical History:  Procedure Laterality Date   COLONOSCOPY  06/11/2013   [facility]   TOE SURGERY     tooth implant     Family History  Problem Relation Age of Onset   Heart disease Other        Parents   Hypertension Other        Parents   Diverticulitis Father    Breast cancer Neg Hx    Colon cancer Neg Hx    Esophageal cancer Neg Hx    Cancer Neg Hx    Stomach cancer Neg Hx    Rectal cancer Neg Hx    Social History   Socioeconomic History   Marital status: Married    Spouse name: Not on file   Number of children: Not on file   Years of education: Not on file   Highest education level: Not on file  Occupational History   Not on file  Tobacco Use   Smoking status: Never   Smokeless tobacco: Never  Vaping Use   Vaping Use: Never used  Substance and Sexual Activity   Alcohol use: Yes  Drug use: Never   Sexual activity: Not on file  Other Topics Concern   Not on file  Social History Narrative   Not on file   Social Determinants of Health   Financial Resource Strain: Low Risk    Difficulty of Paying Living Expenses: Not hard at all  Food Insecurity: No Food Insecurity   Worried About Running Out of Food in the Last Year: Never true   [place] in the Last Year: Never true  Transportation Needs: No Transportation Needs   Lack of Transportation (Medical): No   Lack of Transportation (Non-Medical): No  Physical Activity: Sufficiently Active   Days of Exercise per Week: 5 days   Minutes of Exercise per Session: 30 min   Stress: No Stress Concern Present   Feeling of Stress : Not at all  Social Connections: Socially Integrated   Frequency of Communication with Friends and Family: More than three times a week   Frequency of Social Gatherings with Friends and Family: More than three times a week   Attends Religious Services: More than 4 times per year   Active Member of [employer] or Organizations: Yes   Attends Music therapist: More than 4 times per year   Marital Status: Married    Tobacco Counseling Counseling given: Not Answered   Clinical Intake:  Pre-visit preparation completed: Yes  Pain : No/denies pain Pain Score: 0-No pain     BMI - recorded: 29.01 Nutritional Status: BMI 25 -29 Overweight Nutritional Risks: None Diabetes: No  How often do you need to have someone help you when you read instructions, pamphlets, or other written materials from your doctor or pharmacy?: 1 - Never What is the last grade level you completed in school?: HSG; 2 years of college  Diabetic? no  Interpreter Needed?: No  Information entered by :: Lisette Abu, LPN   Activities of Daily Living In your present state of health, do you have any difficulty performing the following activities: 06/07/2021  Hearing? N  Vision? N  Difficulty concentrating or making decisions? N  Walking or climbing stairs? N  Dressing or bathing? N  Doing errands, shopping? N  Preparing Food and eating ? N  Using the Toilet? N  In the past six months, have you accidently leaked urine? N  Do you have problems with loss of bowel control? N  Managing your Medications? N  Managing your Finances? N  Housekeeping or managing your Housekeeping? N  Some recent data might be hidden    Patient Care Team: Hoyt Koch, MD as PCP - General (Internal Medicine) Opthamology, [facility] as Consulting Physician (Ophthalmology) Melissa Noon, [place] as Referring Physician (Optometry)  Indicate any recent Medical  Services you may have received from other than [facility] providers in the past year (date may be approximate).     Assessment:   This is a routine wellness examination for Dana Small.  Hearing/Vision screen Hearing Screening - Comments:: Patient denied any hearing difficulty.   No hearing aids.  Vision Screening - Comments:: Patient wears corrective glasses/contacts.  Eye exam done annually by: Dr. Melissa Noon  Dietary issues and exercise activities discussed: Current Exercise Habits: Home exercise routine;Structured exercise class, Type of exercise: walking;treadmill;stretching;strength training/weights;Other - see comments (aqua therapy, stretching classes, gardening), Time (Minutes): 30, Frequency (Times/Week): 7, Weekly Exercise (Minutes/Week): 210, Intensity: Moderate, Exercise limited by: None identified   Goals Addressed   None   Depression Screen [facility] 2/9 Scores 06/07/2021 05/05/2020 04/02/2020 04/24/2019 10/23/2017 10/11/2016  10/11/2015  PHQ - 2 Score 0 2 2 0 0 0 0    Fall Risk Fall Risk  06/07/2021 05/05/2020 04/02/2020 01/06/2020 03/11/2019  Falls in the past year? 0 0 0 0 0  Comment - - - - Emmi Telephone Survey: data to providers prior to load  Number falls in past yr: 0 0 0 0 -  Injury with Fall? 0 0 0 0 -  Risk for fall due to : No Fall Risks No Fall Risks - No Fall Risks -  Follow up Falls evaluation completed Falls evaluation completed - Falls evaluation completed -    FALL RISK PREVENTION PERTAINING TO THE HOME:  Any stairs in or around the home? Yes  If so, are there any without handrails? No  Home free of loose throw rugs in walkways, pet beds, electrical cords, etc? Yes  Adequate lighting in your home to reduce risk of falls? Yes   ASSISTIVE DEVICES UTILIZED TO PREVENT FALLS:  Life alert? No  Use of a cane, walker or w/c? No  Grab bars in the bathroom? No  Shower chair or bench in shower? Yes  Elevated toilet seat or a handicapped toilet? No   TIMED UP AND GO:  Was  the test performed? Yes .  Length of time to ambulate 10 feet: 6 sec.   Gait steady and fast without use of assistive device  Cognitive Function: Normal cognitive status assessed by direct observation by this Nurse Health Advisor. No abnormalities found.       6CIT Screen 06/07/2021  What Year? 0 points  What month? 0 points  What time? 0 points  Count back from 20 0 points  Months in reverse 0 points  Repeat phrase 0 points  Total Score 0    Immunizations Immunization History  Administered Date(s) Administered   Fluad Quad(high Dose 65+) 12/11/2018, 01/20/2020, 01/24/2021   Influenza, High Dose Seasonal PF 01/11/2016, 01/01/2017, 01/24/2018   Influenza-Unspecified 01/15/2014   Moderna SARS-COV2 Booster Vaccination 10/06/2020   Moderna Sars-Covid-2 Vaccination 05/13/2019, 06/10/2019   Pfizer Covid-19 Vaccine Bivalent Booster 36yrs & up 01/11/2021   Pneumococcal Polysaccharide-23 10/11/2015   Tdap 03/20/2016   Zoster Recombinat (Shingrix) 03/25/2020, 07/09/2020    TDAP status: Up to date  Flu Vaccine status: Up to date  Pneumococcal vaccine status: Up to date  Covid-19 vaccine status: Completed vaccines  Qualifies for Shingles Vaccine? Yes   Zostavax completed No   Shingrix Completed?: Yes  Screening Tests Health Maintenance  Topic Date Due   Hepatitis C Screening  Never done   Pneumonia Vaccine 77+ Years old (2 - PCV) 10/10/2016   COVID-19 Vaccine (4 - Booster) 03/08/2021   COLONOSCOPY (Pts 45-42yrs Insurance coverage will need to be confirmed)  02/17/2024   TETANUS/TDAP  03/20/2026   INFLUENZA VACCINE  Completed   DEXA SCAN  Completed   Zoster Vaccines- Shingrix  Completed   HPV VACCINES  Aged Out    Health Maintenance  Health Maintenance Due  Topic Date Due   Hepatitis C Screening  Never done   Pneumonia Vaccine 80+ Years old (2 - PCV) 10/10/2016   COVID-19 Vaccine (4 - Booster) 03/08/2021    Colorectal cancer screening: Type of screening:  Colonoscopy. Completed 02/17/2019. Repeat every 5 years  Mammogram status: Completed 03/28/2021. Repeat every year  Bone Density status: Completed 07/29/2019. Results reflect: Bone density results: OSTEOPENIA. Repeat every 2-3 years.  Lung Cancer Screening: (Low Dose CT Chest recommended if Age 26-80 years, 30 pack-year currently smoking  OR have quit w/in 15years.) does not qualify.   Lung Cancer Screening Referral: no  Additional Screening:  Hepatitis C Screening: does qualify; Completed no  Vision Screening: Recommended annual ophthalmology exams for early detection of glaucoma and other disorders of the eye. Is the patient up to date with their annual eye exam?  Yes  Who is the provider or what is the name of the office in which the patient attends annual eye exams? Dr. Melissa Noon If pt is not established with a provider, would they like to be referred to a provider to establish care? No .   Dental Screening: Recommended annual dental exams for proper oral hygiene  Community Resource Referral / Chronic Care Management: CRR required this visit?  No   CCM required this visit?  No      Plan:     I have personally reviewed and noted the following in the patients chart:   Medical and social history Use of alcohol, tobacco or illicit drugs  Current medications and supplements including opioid prescriptions.  Functional ability and status Nutritional status Physical activity Advanced directives List of other physicians Hospitalizations, surgeries, and ER visits in previous 12 months Vitals Screenings to include cognitive, depression, and falls Referrals and appointments  In addition, I have reviewed and discussed with patient certain preventive protocols, quality metrics, and best practice recommendations. A written personalized care plan for preventive services as well as general preventive health recommendations were provided to patient.     Sheral Flow,  LPN   [id number]   Nurse Notes:  Hearing Screening - Comments:: Patient denied any hearing difficulty.   No hearing aids.  Vision Screening - Comments:: Patient wears corrective glasses/contacts.  Eye exam done annually by: Dr. Melissa Noon   Medical screening examination/treatment/procedure(s) were performed by non-physician practitioner and as supervising physician I was immediately available for consultation/collaboration.  I agree with above. Lew Dawes, MD

## 2021-08-08 DIAGNOSIS — L570 Actinic keratosis: Secondary | ICD-10-CM | POA: Diagnosis not present

## 2021-08-08 DIAGNOSIS — L820 Inflamed seborrheic keratosis: Secondary | ICD-10-CM | POA: Diagnosis not present

## 2021-08-18 ENCOUNTER — Other Ambulatory Visit: Payer: Self-pay | Admitting: Internal Medicine

## 2021-08-18 DIAGNOSIS — I10 Essential (primary) hypertension: Secondary | ICD-10-CM

## 2021-11-07 ENCOUNTER — Other Ambulatory Visit: Payer: Self-pay | Admitting: Internal Medicine

## 2021-11-15 ENCOUNTER — Other Ambulatory Visit: Payer: Self-pay | Admitting: Internal Medicine

## 2021-11-15 DIAGNOSIS — I10 Essential (primary) hypertension: Secondary | ICD-10-CM

## 2021-11-23 ENCOUNTER — Ambulatory Visit: Payer: Self-pay

## 2021-11-23 ENCOUNTER — Ambulatory Visit (INDEPENDENT_AMBULATORY_CARE_PROVIDER_SITE_OTHER): Payer: Medicare Other

## 2021-11-23 ENCOUNTER — Ambulatory Visit (INDEPENDENT_AMBULATORY_CARE_PROVIDER_SITE_OTHER): Payer: Medicare Other | Admitting: Family Medicine

## 2021-11-23 VITALS — BP 140/78 | HR 68 | Ht 64.0 in | Wt 161.8 lb

## 2021-11-23 DIAGNOSIS — G8929 Other chronic pain: Secondary | ICD-10-CM

## 2021-11-23 DIAGNOSIS — M160 Bilateral primary osteoarthritis of hip: Secondary | ICD-10-CM | POA: Diagnosis not present

## 2021-11-23 DIAGNOSIS — M25551 Pain in right hip: Secondary | ICD-10-CM

## 2021-11-23 DIAGNOSIS — M25512 Pain in left shoulder: Secondary | ICD-10-CM

## 2021-11-23 NOTE — Progress Notes (Signed)
I, Peterson Lombard, LAT, ATC acting as a scribe for Lynne Leader, MD.  Subjective:    CC: L upper arm and R hip pain  HPI: Pt is a 76 y/o female c/o L upper arm x 6 months. Pt think the pain is related to opening and closer her car door, gardening, and overuse in aerobics. Pt locates pain to the lateral apsect of the mid-upper arm.   Neck pain: no Radiates: no Aggravates: shoulder ABD, ER Treatments tried: IBU, CBD oil  Pt also c/o R hip pain. Pt locates pain to the lateral aspect of the R hip  Radiating pain: no LE numbness/tingling: no LE weakness: no Aggravates: prolonged sitting  Treatments tried: IBU  Dx imaging: 10/14/19 L-spine XR  Pertinent review of Systems: No fevers or chills  Relevant historical information: Hypertension   Objective:    Vitals:   11/23/21 1345  BP: (!) 140/78  Pulse: 68  SpO2: 96%   General: Well Developed, well nourished, and in no acute distress.   MSK: Left shoulder: Normal-appearing Nontender Normal shoulder motion some pain with abduction and internal rotation. Mildly positive Hawkins and Neer's test.  Negative Yergason's and speeds test.  Intact strength.  Right hip: Normal-appearing Tender palpation greater trochanter. Normal hip motion. Hip abduction strength is diminished 4/5.  External rotation strength is diminished 4/5.  Lab and Radiology Results  Diagnostic Limited MSK Ultrasound of: Left shoulder Biceps tendon intact normal. Subscapularis tendon is normal. Supraspinatus tendon intact with moderate subacromial bursitis. Infraspinatus tendon normal. AC joint mild effusion. Impression: Subacromial bursitis  X-ray images left shoulder and right hip obtained today personally and independently interpreted  Left shoulder: No acute fractures.  No significant degenerative changes.  Right hip: Mild right hip DJD.  Mild enthesiopathy changes greater trochanter.  Await formal radiology review    Impression and  Recommendations:    Assessment and Plan: 77 y.o. female with  Left shoulder pain thought to be due to subacromial bursitis.  Plan to treat with physical therapy.  Right lateral hip pain due to trochanteric bursitis.  Again physical therapy should be quite helpful.  Recommend avoid high-dose oral NSAIDs.  Recheck in 6 weeks.  PDMP not reviewed this encounter. Orders Placed This Encounter  Procedures   [place] LIMITED JOINT SPACE STRUCTURES LOW RIGHT(NO LINKED CHARGES)    Order Specific Question:   Reason for Exam (SYMPTOM  OR DIAGNOSIS REQUIRED)    Answer:   right hip pain    Order Specific Question:   Preferred imaging location?    Answer:   [place]   DG HIP UNILAT W OR W/O PELVIS 2-3 VIEWS RIGHT    Standing Status:   Future    Number of Occurrences:   1    Standing Expiration Date:   12/24/2021    Order Specific Question:   Reason for Exam (SYMPTOM  OR DIAGNOSIS REQUIRED)    Answer:   right hip pain    Order Specific Question:   Preferred imaging location?    Answer:   [facility]   DG Shoulder Left    Standing Status:   Future    Number of Occurrences:   1    Standing Expiration Date:   12/24/2021    Order Specific Question:   Reason for Exam (SYMPTOM  OR DIAGNOSIS REQUIRED)    Answer:   left shoulder pain    Order Specific Question:   Preferred imaging location?    Answer:  [place]   Ambulatory referral to Physical Therapy    Referral Priority:   Routine    Referral Type:   Physical Medicine    Referral Reason:   Specialty Services Required    Requested Specialty:   Physical Therapy    Number of Visits Requested:   1   No orders of the defined types were placed in this encounter.   Discussed warning signs or symptoms. Please see discharge instructions. Patient expresses understanding.   The above documentation has been reviewed and is accurate and complete Lynne Leader, M.D.

## 2021-11-23 NOTE — Patient Instructions (Signed)
Thank you for coming in today.   I've referred you to Physical Therapy.  Let us know if you don't hear from them in one week.   Please get an Xray today before you leave   Recheck in 6-8 weeks.   Let me know if this is not working.

## 2021-11-24 NOTE — Progress Notes (Signed)
Right hip x-ray shows bilateral arthritis changes.

## 2021-11-24 NOTE — Progress Notes (Signed)
Left shoulder x-ray she is a little evidence of rotator cuff tendinitis.  No severe arthritis changes are present.

## 2021-11-28 ENCOUNTER — Encounter: Payer: Self-pay | Admitting: Physical Therapy

## 2021-11-28 ENCOUNTER — Ambulatory Visit (INDEPENDENT_AMBULATORY_CARE_PROVIDER_SITE_OTHER): Payer: Medicare Other | Admitting: Physical Therapy

## 2021-11-28 DIAGNOSIS — M25512 Pain in left shoulder: Secondary | ICD-10-CM

## 2021-11-28 DIAGNOSIS — M6281 Muscle weakness (generalized): Secondary | ICD-10-CM | POA: Diagnosis not present

## 2021-11-28 DIAGNOSIS — M25551 Pain in right hip: Secondary | ICD-10-CM | POA: Diagnosis not present

## 2021-11-28 NOTE — Therapy (Unsigned)
OUTPATIENT PHYSICAL THERAPY LOWER EXTREMITY EVALUATION and SHOULDER EVALUATION   Patient Name: Dana Small MRN: [id number] DOB:[birth year], 76 y.o., female Today's Date: 11/29/2021   PT End of Session - 11/28/21 1554     Visit Number 1    Number of Visits 12    Date for PT Re-Evaluation 01/09/22    Authorization Type medicare    Progress Note Due on Visit 10    PT Start Time 4481    PT Stop Time 1635    PT Time Calculation (min) 39 min             Past Medical History:  Diagnosis Date   Arthritis    Hypertension    Past Surgical History:  Procedure Laterality Date   COLONOSCOPY  06/11/2013   [facility]   TOE SURGERY     tooth implant     Patient Active Problem List   Diagnosis Date Noted   Hyperlipidemia with target LDL less than 100 06/14/2020   VBI (vertebrobasilar insufficiency) 04/13/2020   Diuretic-induced hypokalemia 04/13/2020   Cerebral microvascular disease 04/13/2020   Chronic bilateral low back pain without sciatica 10/14/2019   Pre-diabetes 10/14/2019   Obesity 04/25/2019   Essential hypertension 06/18/2014   Osteoarthritis 06/18/2014   Routine general medical examination at a health care facility 06/18/2014    PCP: Pricilla Holm, MD  REFERRING PROVIDER: Gregor Hams, MD   REFERRING DIAG: [phone] (ICD-10-CM) - Chronic right hip pain M25.512,G89.29 (ICD-10-CM) - Chronic left shoulder pain  THERAPY DIAG:  Pain in right hip  Acute pain of left shoulder  Muscle weakness (generalized)  Rationale for Evaluation and Treatment Rehabilitation  ONSET DATE: 3 months for left shoulder  SUBJECTIVE:   SUBJECTIVE STATEMENT: States that she has had arthritis  and she has arthritis in her right toe. Pain in her right hip hurts with tight pains or sitting funning on the couch. States she has pain with walking and she would like to walk better. States she is going to [place] for 2 months and she wants to be able to walk  easier.   Reports her pain in her left shoulder is new. States she thinks it is associated with opening her car door. States that she has difficulty raising her arms up overhead. Reports her shoulder is the bigger issue. Pain has gotten worse over the last month. States that she does water aerobics.    PERTINENT HISTORY: HTN, history of right hip pain and right "dancers" toe  PAIN:  Are you having pain? Yes: NPRS scale: 5/10 Pain location: left upper arm Pain description: achy Aggravating factors: water aerobics, bringing arm out to the side  Relieving factors: advil  PRECAUTIONS: None  WEIGHT BEARING RESTRICTIONS No  FALLS:  Has patient fallen in last 6 months? No    OCCUPATION: not currently working  PLOF: Independent  PATIENT GOALS hip - to be able to walk easier, to have less pain in her left shoulder   OBJECTIVE:   DIAGNOSTIC FINDINGS: 11/23/21 Left shoulder xray IMPRESSION: Minimal cortical sclerosis is identified in the lateral superior aspect of the left humerus. Otherwise no significant degenerative joint changes noted.      COGNITION:  Overall cognitive status: Within functional limits for tasks assessed     SENSATION: WFL   POSTURE: rounded shoulders, forward head, and flexed trunk   PALPATION: Tenderness to palpation along left biceps and triceps, hypo mobility with left shoulder in posterior direction   Cervical A/PROM:  11/29/2021      Flexion  0% limited     Extension 75% limited      R ROT  50% limited     L ROT  50% limited*     R SB 50% limited     L SB 50% limited      * tight on ipsilateral side   (Blank rows = not tested)    LE Measurements will assess in future sessions Lower Extremity Right 11/29/2021 Left 11/29/2021   A/PROM MMT A/PROM MMT  Hip Flexion      Hip Extension      Hip Abduction      Hip Adduction      Hip Internal rotation      Hip External rotation      Knee Flexion      Knee Extension      Ankle  Dorsiflexion      Ankle Plantarflexion      Ankle Inversion      Ankle Eversion       (Blank rows = not tested)  * pain   UE Measurements Upper Extremity Right 11/29/2021 Left 11/29/2021   A/PROM MMT A/PROM MMT  Shoulder Flexion 150/170 4 100* 3+  Shoulder Extension      Shoulder Abduction WFL (scaption) 4+ 105* 3+  Shoulder Adduction      Shoulder Internal Rotation Reaches to T12 SP /80 4+ Reaches to T12 SP /45 4+  Shoulder External Rotation Reaches to T1SP /95 4 Reaches to top of left shoulder /45 4-  Elbow Flexion      Elbow Extension      Wrist Flexion      Wrist Extension      Wrist Supination      Wrist Pronation      Wrist Ulnar Deviation      Wrist Radial Deviation      Grip Strength NA  NA     (Blank rows = not tested)   * pain Patient left handed.    TODAY'S TREATMENT: 11/29/2021 Therapeutic Exercise:  Aerobic: Supine: AAROM shoulder flexion dowel 2 minutes, shoulder ER AAROM dowel 2 minutes  Prone:  Seated:  Standing: Neuromuscular Re-education: Manual Therapy: Therapeutic Activity: Self Care: Trigger Point Dry Needling:  Modalities:    PATIENT EDUCATION:  Education details: on current presentation, on HEP, on clinical outcomes score and POC Person educated: Patient Education method: Explanation, Demonstration, and Handouts Education comprehension: verbalized understanding   HOME EXERCISE PROGRAM: [id number]  ASSESSMENT:  CLINICAL IMPRESSION: Patient complains of left shoulder pain and right hip pain. Session focused on assessment of left shoulder per patient request, will assess right hip in future. Patient with limitations in ROM and strength that are limiting overall function. Education patient in current presentation and patient would greatly benefit from skilled PT to improve overall function and QOL.   OBJECTIVE IMPAIRMENTS decreased activity tolerance, difficulty walking, decreased ROM, decreased strength, impaired UE functional use,  postural dysfunction, and pain.   ACTIVITY LIMITATIONS lifting, reach over head, hygiene/grooming, and locomotion level  PARTICIPATION LIMITATIONS: driving and community activity  PERSONAL FACTORS Age are also affecting patient's functional outcome.   REHAB POTENTIAL: Good  CLINICAL DECISION MAKING: Stable/uncomplicated  EVALUATION COMPLEXITY: Low  GOALS: Goals reviewed with patient?  yes  SHORT TERM GOALS:  Patient will be independent in self management strategies to improve quality of life and functional outcomes. Baseline: new program Target date: 12/20/2021 Goal status: INITIAL  2.  Patient will report at least  50% improvement in overall symptoms and/or function to demonstrate improved functional mobility Baseline: 0% Target date: 12/20/2021 Goal status: INITIAL  3.  Patient will be able to demonstrate at least 120 degrees of active left shoulder flexion and abd to improve OH reaching  Baseline: see above Target date: 12/20/2021 Goal status: INITIAL       LONG TERM GOALS:  Patient will report at least 75% improvement in overall symptoms and/or function to demonstrate improved functional mobility Baseline: 0% Target date: 12/27/2021 Goal status: INITIAL  2.  Patient will be able close car door without pain to improve ability to get in and out of the car.  Baseline: painful Target date: 12/27/2021 Goal status: INITIAL  3.  Patient will be able to walk around neighborhood for at least 30 minutes without pain limiting her to improve ability to walk while visiting her  son in [place]. Baseline: unable Target date: 12/27/2021 Goal status: INITIAL  PLAN: PT FREQUENCY: 2x/week  PT DURATION: 6 weeks  PLANNED INTERVENTIONS: Therapeutic exercises, Therapeutic activity, Neuromuscular re-education, Balance training, Gait training, Patient/Family education, Self Care, Joint mobilization, Joint manipulation, Vestibular training, Orthotic/Fit training, Aquatic Therapy, Dry  Needling, Electrical stimulation, Spinal manipulation, Spinal mobilization, Cryotherapy, Moist heat, Traction, Ultrasound, Ionotophoresis '4mg'$ /ml Dexamethasone, and Manual therapy  PLAN FOR NEXT SESSION: assess right hip, develop HEP for shoulder and hip    9:06 AM, 11/29/21 Jerene Pitch, DPT Physical Therapy with [facility]

## 2021-11-29 NOTE — Therapy (Unsigned)
OUTPATIENT PHYSICAL THERAPY TREATMENT NOTE   Patient Name: Dana Small MRN: [id number] DOB:01/25/1946, 76 y.o., female Today's Date: 11/30/2021   END OF SESSION:   PT End of Session - 11/30/21 1431     Visit Number 2    Number of Visits 12    Date for PT Re-Evaluation 01/09/22    Authorization Type medicare    Progress Note Due on Visit 10    PT Start Time 4098    PT Stop Time 1512    PT Time Calculation (min) 40 min             Past Medical History:  Diagnosis Date   Arthritis    Hypertension    Past Surgical History:  Procedure Laterality Date   COLONOSCOPY  06/11/2013   [facility]   TOE SURGERY     tooth implant     Patient Active Problem List   Diagnosis Date Noted   Hyperlipidemia with target LDL less than 100 06/14/2020   VBI (vertebrobasilar insufficiency) 04/13/2020   Diuretic-induced hypokalemia 04/13/2020   Cerebral microvascular disease 04/13/2020   Chronic bilateral low back pain without sciatica 10/14/2019   Pre-diabetes 10/14/2019   Obesity 04/25/2019   Essential hypertension 06/18/2014   Osteoarthritis 06/18/2014   Routine general medical examination at a health care facility 06/18/2014      PCP: Pricilla Holm, MD   REFERRING PROVIDER: Gregor Hams, MD     REFERRING DIAG: [phone] (ICD-10-CM) - Chronic right hip pain M25.512,G89.29 (ICD-10-CM) - Chronic left shoulder pain   THERAPY DIAG:  Pain in right hip   Acute pain of left shoulder   Muscle weakness (generalized)   Rationale for Evaluation and Treatment Rehabilitation   ONSET DATE: 3 months for left shoulder   SUBJECTIVE:    SUBJECTIVE STATEMENT: 11/30/2021 States doing exercises but the one out to the side still bothers her.   Eval: States that she has had arthritis  and she has arthritis in her right toe. Pain in her right hip hurts with tight pains or sitting funning on the couch. States she has pain with walking and she would like to  walk better. States she is going to [place] for 2 months and she wants to be able to walk easier.    Reports her pain in her left shoulder is new. States she thinks it is associated with opening her car door. States that she has difficulty raising her arms up overhead. Reports her shoulder is the bigger issue. Pain has gotten worse over the last month. States that she does water aerobics.      PERTINENT HISTORY: HTN, history of right hip pain and right "dancers" toe   PAIN:  Are you having pain? Yes: NPRS scale: 5/10 Pain location: left upper arm Pain description: achy Aggravating factors: water aerobics, bringing arm out to the side  Relieving factors: advil   PRECAUTIONS: None   WEIGHT BEARING RESTRICTIONS No   FALLS:  Has patient fallen in last 6 months? No       OCCUPATION: not currently working   PLOF: Independent   PATIENT GOALS hip - to be able to walk easier, to have less pain in her left shoulder     OBJECTIVE:    DIAGNOSTIC FINDINGS: 11/23/21 Left shoulder xray IMPRESSION: Minimal cortical sclerosis is identified in the lateral superior aspect of the left humerus. Otherwise no significant degenerative joint changes noted.         COGNITION:  Overall cognitive status: Within functional limits for tasks assessed                          SENSATION: WFL     POSTURE: rounded shoulders, forward head, and flexed trunk    PALPATION: Tenderness to palpation along left biceps and triceps, hypo mobility with left shoulder in posterior direction              Cervical A/PROM:    11/29/2021      Flexion  0% limited     Extension 75% limited      R ROT  50% limited     L ROT  50% limited*     R SB 50% limited     L SB 50% limited                           * tight on ipsilateral side                  (Blank rows = not tested)                LE Measurements will assess in future sessions       Lower Extremity Right 11/29/2021 Left 11/29/2021     A/PROM MMT A/PROM MMT  Hip Flexion          Hip Extension          Hip Abduction          Hip Adduction          Hip Internal rotation          Hip External rotation          Knee Flexion          Knee Extension          Ankle Dorsiflexion          Ankle Plantarflexion          Ankle Inversion          Ankle Eversion           (Blank rows = not tested)            * pain              UE Measurements       Upper Extremity Right 11/29/2021 Left 11/29/2021    A/PROM MMT A/PROM MMT  Shoulder Flexion 150/170 4 100* 3+  Shoulder Extension          Shoulder Abduction WFL (scaption) 4+ 105* 3+  Shoulder Adduction          Shoulder Internal Rotation Reaches to T12 SP /80 4+ Reaches to T12 SP /45 4+  Shoulder External Rotation Reaches to T1SP /95 4 Reaches to top of left shoulder /45 4-  Elbow Flexion          Elbow Extension          Wrist Flexion          Wrist Extension          Wrist Supination          Wrist Pronation          Wrist Ulnar Deviation          Wrist Radial Deviation          Grip Strength NA   NA                          (  Blank rows = not tested)                       * pain Patient left handed.       TODAY'S TREATMENT: 11/30/2021 Therapeutic Exercise:    Aerobic: Supine: AAROM shoulder flexion dowel 2 minutes, shoulder ER AAROM dowel 2 minutes, shoulder ER at wall 2 minutes, Shoulder flexion on wall 3 minutes, shoulder ext red band 2x15 5" holds  Prone:    Seated: pulley's flexion 3 minutes, pulley's abd 3 minutes     Standing: Neuromuscular Re-education: Manual Therapy: GHJ traction with PROM in all directions Therapeutic Activity: Self Care: Trigger Point Dry Needling:  Modalities:      PATIENT EDUCATION:  Education details: on HEP Person educated: Patient Education method: Explanation, Media planner, and Handouts Education comprehension: verbalized understanding     HOME EXERCISE PROGRAM: [id number]   ASSESSMENT:   CLINICAL  IMPRESSION: 11/30/2021 Tolerated session well. Progressed exercises and improved shoulder motion noted on this date. Slight discomfort with fatigue and end range motions. Added all new exercises to HEP. Will continue with current POC as tolerated.    Eval: Patient complains of left shoulder pain and right hip pain. Session focused on assessment of left shoulder per patient request, will assess right hip in future. Patient with limitations in ROM and strength that are limiting overall function. Education patient in current presentation and patient would greatly benefit from skilled PT to improve overall function and QOL.     OBJECTIVE IMPAIRMENTS decreased activity tolerance, difficulty walking, decreased ROM, decreased strength, impaired UE functional use, postural dysfunction, and pain.    ACTIVITY LIMITATIONS lifting, reach over head, hygiene/grooming, and locomotion level   PARTICIPATION LIMITATIONS: driving and community activity   PERSONAL FACTORS Age are also affecting patient's functional outcome.    REHAB POTENTIAL: Good   CLINICAL DECISION MAKING: Stable/uncomplicated   EVALUATION COMPLEXITY: Low   GOALS: Goals reviewed with patient?  yes   SHORT TERM GOALS:   Patient will be independent in self management strategies to improve quality of life and functional outcomes. Baseline: new program Target date: 12/20/2021 Goal status: INITIAL   2.  Patient will report at least 50% improvement in overall symptoms and/or function to demonstrate improved functional mobility Baseline: 0% Target date: 12/20/2021 Goal status: INITIAL   3.  Patient will be able to demonstrate at least 120 degrees of active left shoulder flexion and abd to improve OH reaching  Baseline: see above Target date: 12/20/2021 Goal status: INITIAL             LONG TERM GOALS:   Patient will report at least 75% improvement in overall symptoms and/or function to demonstrate improved functional  mobility Baseline: 0% Target date: 12/27/2021 Goal status: INITIAL   2.  Patient will be able close car door without pain to improve ability to get in and out of the car.  Baseline: painful Target date: 12/27/2021 Goal status: INITIAL   3.  Patient will be able to walk around neighborhood for at least 30 minutes without pain limiting her to improve ability to walk while visiting her  son in [place]. Baseline: unable Target date: 12/27/2021 Goal status: INITIAL   PLAN: PT FREQUENCY: 2x/week   PT DURATION: 6 weeks   PLANNED INTERVENTIONS: Therapeutic exercises, Therapeutic activity, Neuromuscular re-education, Balance training, Gait training, Patient/Family education, Self Care, Joint mobilization, Joint manipulation, Vestibular training, Orthotic/Fit training, Aquatic Therapy, Dry Needling, Electrical stimulation, Spinal manipulation, Spinal mobilization, Cryotherapy, Moist  heat, Traction, Ultrasound, Ionotophoresis '4mg'$ /ml Dexamethasone, and Manual therapy   PLAN FOR NEXT SESSION: assess right hip, develop HEP for shoulder and hip      3:16 PM, 11/30/21 Jerene Pitch, DPT Physical Therapy with [facility]

## 2021-11-30 ENCOUNTER — Ambulatory Visit (INDEPENDENT_AMBULATORY_CARE_PROVIDER_SITE_OTHER): Payer: Medicare Other | Admitting: Physical Therapy

## 2021-11-30 ENCOUNTER — Encounter: Payer: Self-pay | Admitting: Physical Therapy

## 2021-11-30 DIAGNOSIS — M25512 Pain in left shoulder: Secondary | ICD-10-CM

## 2021-11-30 DIAGNOSIS — M25551 Pain in right hip: Secondary | ICD-10-CM | POA: Diagnosis not present

## 2021-11-30 DIAGNOSIS — M6281 Muscle weakness (generalized): Secondary | ICD-10-CM

## 2021-12-05 ENCOUNTER — Ambulatory Visit (INDEPENDENT_AMBULATORY_CARE_PROVIDER_SITE_OTHER): Payer: Medicare Other | Admitting: Physical Therapy

## 2021-12-05 ENCOUNTER — Encounter: Payer: Self-pay | Admitting: Physical Therapy

## 2021-12-05 DIAGNOSIS — M25551 Pain in right hip: Secondary | ICD-10-CM | POA: Diagnosis not present

## 2021-12-05 DIAGNOSIS — M6281 Muscle weakness (generalized): Secondary | ICD-10-CM

## 2021-12-05 DIAGNOSIS — M25512 Pain in left shoulder: Secondary | ICD-10-CM

## 2021-12-05 NOTE — Therapy (Signed)
OUTPATIENT PHYSICAL THERAPY TREATMENT NOTE   Patient Name: Dana Small MRN: [id number] DOB:Jun 14, 1945, 76 y.o., female Today's Date: 12/05/2021   END OF SESSION:   PT End of Session - 12/05/21 1549     Visit Number 3    Number of Visits 12    Date for PT Re-Evaluation 01/09/22    Authorization Type medicare    Progress Note Due on Visit 10    PT Start Time 1602    PT Stop Time 1640    PT Time Calculation (min) 38 min             Past Medical History:  Diagnosis Date   Arthritis    Hypertension    Past Surgical History:  Procedure Laterality Date   COLONOSCOPY  06/11/2013   [facility]   TOE SURGERY     tooth implant     Patient Active Problem List   Diagnosis Date Noted   Hyperlipidemia with target LDL less than 100 06/14/2020   VBI (vertebrobasilar insufficiency) 04/13/2020   Diuretic-induced hypokalemia 04/13/2020   Cerebral microvascular disease 04/13/2020   Chronic bilateral low back pain without sciatica 10/14/2019   Pre-diabetes 10/14/2019   Obesity 04/25/2019   Essential hypertension 06/18/2014   Osteoarthritis 06/18/2014   Routine general medical examination at a health care facility 06/18/2014      PCP: Pricilla Holm, MD   REFERRING PROVIDER: Gregor Hams, MD     REFERRING DIAG: [id number] (ICD-10-CM) - Chronic right hip pain M25.512,G89.29 (ICD-10-CM) - Chronic left shoulder pain   THERAPY DIAG:  Pain in right hip   Acute pain of left shoulder   Muscle weakness (generalized)   Rationale for Evaluation and Treatment Rehabilitation   ONSET DATE: 3 months for left shoulder   SUBJECTIVE:    SUBJECTIVE STATEMENT: 12/05/2021 States doing exercises and she is a little sore from them but overall feels good.  Eval: States that she has had arthritis  and she has arthritis in her right toe. Pain in her right hip hurts with tight pains or sitting funning on the couch. States she has pain with walking and she would  like to walk better. States she is going to [place] for 2 months and she wants to be able to walk easier.    Reports her pain in her left shoulder is new. States she thinks it is associated with opening her car door. States that she has difficulty raising her arms up overhead. Reports her shoulder is the bigger issue. Pain has gotten worse over the last month. States that she does water aerobics.      PERTINENT HISTORY: HTN, history of right hip pain and right "dancers" toe   PAIN:  Are you having pain? Yes: NPRS scale: 3/10 Pain location: left upper arm Pain description: achy Aggravating factors: water aerobics, bringing arm out to the side  Relieving factors: advil   PRECAUTIONS: None   WEIGHT BEARING RESTRICTIONS No   FALLS:  Has patient fallen in last 6 months? No       OCCUPATION: not currently working   PLOF: Independent   PATIENT GOALS hip - to be able to walk easier, to have less pain in her left shoulder     OBJECTIVE:    DIAGNOSTIC FINDINGS: 11/23/21 Left shoulder xray IMPRESSION: Minimal cortical sclerosis is identified in the lateral superior aspect of the left humerus. Otherwise no significant degenerative joint changes noted.         COGNITION:  Overall cognitive status: Within functional limits for tasks assessed                          SENSATION: WFL     POSTURE: rounded shoulders, forward head, and flexed trunk    PALPATION: Tenderness to palpation along left biceps and triceps, hypo mobility with left shoulder in posterior direction              Cervical A/PROM:    11/29/2021      Flexion  0% limited     Extension 75% limited      R ROT  50% limited     L ROT  50% limited*     R SB 50% limited     L SB 50% limited                           * tight on ipsilateral side                  (Blank rows = not tested)                LE Measurements will assess in future sessions       Lower Extremity Right 11/29/2021 Left 11/29/2021     A/PROM MMT A/PROM MMT  Hip Flexion          Hip Extension          Hip Abduction          Hip Adduction          Hip Internal rotation          Hip External rotation          Knee Flexion          Knee Extension          Ankle Dorsiflexion          Ankle Plantarflexion          Ankle Inversion          Ankle Eversion           (Blank rows = not tested)            * pain              UE Measurements       Upper Extremity Right 11/29/2021 Left 11/29/2021    A/PROM MMT A/PROM MMT  Shoulder Flexion 150/170 4 100* 3+  Shoulder Extension          Shoulder Abduction WFL (scaption) 4+ 105* 3+  Shoulder Adduction          Shoulder Internal Rotation Reaches to T12 SP /80 4+ Reaches to T12 SP /45 4+  Shoulder External Rotation Reaches to T1SP /95 4 Reaches to top of left shoulder /45 4-  Elbow Flexion          Elbow Extension          Wrist Flexion          Wrist Extension          Wrist Supination          Wrist Pronation          Wrist Ulnar Deviation          Wrist Radial Deviation          Grip Strength NA   NA                          (  Blank rows = not tested)                       * pain Patient left handed.       TODAY'S TREATMENT: 12/05/2021 Therapeutic Exercise:    Aerobic:UBE fwd and backward 2.5 minutes each Supine: thoracic extension - towel roll down spine 2 minutes static, then scap retraction 2 minutes, then scap protraction 2 minutes  Prone:    Seated: shoulder ER x30 B 5" holds AROM    Standing: shoulder flexion AAROM - arm slides up wall - hands in pillow case 2 minutes Neuromuscular Re-education: Manual Therapy: GHJ traction with PROM in all directions and traction of left shoulder Therapeutic Activity: Self Care: Trigger Point Dry Needling:  Modalities:      PATIENT EDUCATION:  Education details: on HEP Person educated: Patient Education method: Explanation, Media planner, and Handouts Education comprehension: verbalized understanding      HOME EXERCISE PROGRAM: [id number]   ASSESSMENT:   CLINICAL IMPRESSION: 12/05/2021 Improved shoulder ER today measuring 60 degrees. Patient continues to guard but motions and tolerance to interventions improve when she relaxes. Overall patient is doing well and will continue to benefit from skilled PT.   Eval: Patient complains of left shoulder pain and right hip pain. Session focused on assessment of left shoulder per patient request, will assess right hip in future. Patient with limitations in ROM and strength that are limiting overall function. Education patient in current presentation and patient would greatly benefit from skilled PT to improve overall function and QOL.     OBJECTIVE IMPAIRMENTS decreased activity tolerance, difficulty walking, decreased ROM, decreased strength, impaired UE functional use, postural dysfunction, and pain.    ACTIVITY LIMITATIONS lifting, reach over head, hygiene/grooming, and locomotion level   PARTICIPATION LIMITATIONS: driving and community activity   PERSONAL FACTORS Age are also affecting patient's functional outcome.    REHAB POTENTIAL: Good   CLINICAL DECISION MAKING: Stable/uncomplicated   EVALUATION COMPLEXITY: Low   GOALS: Goals reviewed with patient?  yes   SHORT TERM GOALS:   Patient will be independent in self management strategies to improve quality of life and functional outcomes. Baseline: new program Target date: 12/20/2021 Goal status: INITIAL   2.  Patient will report at least 50% improvement in overall symptoms and/or function to demonstrate improved functional mobility Baseline: 0% Target date: 12/20/2021 Goal status: INITIAL   3.  Patient will be able to demonstrate at least 120 degrees of active left shoulder flexion and abd to improve OH reaching  Baseline: see above Target date: 12/20/2021 Goal status: INITIAL             LONG TERM GOALS:   Patient will report at least 75% improvement in overall symptoms and/or  function to demonstrate improved functional mobility Baseline: 0% Target date: 12/27/2021 Goal status: INITIAL   2.  Patient will be able close car door without pain to improve ability to get in and out of the car.  Baseline: painful Target date: 12/27/2021 Goal status: INITIAL   3.  Patient will be able to walk around neighborhood for at least 30 minutes without pain limiting her to improve ability to walk while visiting her  son in [place]. Baseline: unable Target date: 12/27/2021 Goal status: INITIAL   PLAN: PT FREQUENCY: 2x/week   PT DURATION: 6 weeks   PLANNED INTERVENTIONS: Therapeutic exercises, Therapeutic activity, Neuromuscular re-education, Balance training, Gait training, Patient/Family education, Self Care, Joint mobilization, Joint manipulation, Vestibular training, Orthotic/Fit training,  Aquatic Therapy, Dry Needling, Electrical stimulation, Spinal manipulation, Spinal mobilization, Cryotherapy, Moist heat, Traction, Ultrasound, Ionotophoresis '4mg'$ /ml Dexamethasone, and Manual therapy   PLAN FOR NEXT SESSION: shoulder ROM and strengthening. assess right hip when appropriate, develop HEP for shoulder and hip      4:42 PM, 12/05/21 Jerene Pitch, DPT Physical Therapy with [facility]

## 2021-12-06 ENCOUNTER — Ambulatory Visit (INDEPENDENT_AMBULATORY_CARE_PROVIDER_SITE_OTHER): Payer: Medicare Other | Admitting: Physical Therapy

## 2021-12-06 ENCOUNTER — Encounter: Payer: Self-pay | Admitting: Physical Therapy

## 2021-12-06 DIAGNOSIS — M25512 Pain in left shoulder: Secondary | ICD-10-CM

## 2021-12-06 DIAGNOSIS — M6281 Muscle weakness (generalized): Secondary | ICD-10-CM | POA: Diagnosis not present

## 2021-12-06 DIAGNOSIS — M25551 Pain in right hip: Secondary | ICD-10-CM

## 2021-12-06 NOTE — Therapy (Signed)
OUTPATIENT PHYSICAL THERAPY TREATMENT NOTE   Patient Name: Dana Small MRN: [id number] DOB:24-Dec-1945, 76 y.o., female Today's Date: 12/06/2021   END OF SESSION:   PT End of Session - 12/06/21 1444     Visit Number 4    Number of Visits 12    Date for PT Re-Evaluation 01/09/22    Authorization Type medicare    Progress Note Due on Visit 10    PT Start Time 1440    PT Stop Time 5427   couldn't stay exxtra 3 minutes   PT Time Calculation (min) 35 min             Past Medical History:  Diagnosis Date   Arthritis    Hypertension    Past Surgical History:  Procedure Laterality Date   COLONOSCOPY  06/11/2013   [facility]   TOE SURGERY     tooth implant     Patient Active Problem List   Diagnosis Date Noted   Hyperlipidemia with target LDL less than 100 06/14/2020   VBI (vertebrobasilar insufficiency) 04/13/2020   Diuretic-induced hypokalemia 04/13/2020   Cerebral microvascular disease 04/13/2020   Chronic bilateral low back pain without sciatica 10/14/2019   Pre-diabetes 10/14/2019   Obesity 04/25/2019   Essential hypertension 06/18/2014   Osteoarthritis 06/18/2014   Routine general medical examination at a health care facility 06/18/2014      PCP: Pricilla Holm, MD   REFERRING PROVIDER: Gregor Hams, MD     REFERRING DIAG: [phone] (ICD-10-CM) - Chronic right hip pain M25.512,G89.29 (ICD-10-CM) - Chronic left shoulder pain   THERAPY DIAG:  Pain in right hip   Acute pain of left shoulder   Muscle weakness (generalized)   Rationale for Evaluation and Treatment Rehabilitation   ONSET DATE: 3 months for left shoulder   SUBJECTIVE:    SUBJECTIVE STATEMENT: 12/06/2021 States she was a little sore after her session. States she was having some pain with her exercises at the gym   Eval: States that she has had arthritis  and she has arthritis in her right toe. Pain in her right hip hurts with tight pains or sitting funning  on the couch. States she has pain with walking and she would like to walk better. States she is going to [place] for 2 months and she wants to be able to walk easier.    Reports her pain in her left shoulder is new. States she thinks it is associated with opening her car door. States that she has difficulty raising her arms up overhead. Reports her shoulder is the bigger issue. Pain has gotten worse over the last month. States that she does water aerobics.      PERTINENT HISTORY: HTN, history of right hip pain and right "dancers" toe   PAIN:  Are you having pain? Yes: NPRS scale: 4/10 Pain location: left upper arm Pain description: achy Aggravating factors: water aerobics, bringing arm out to the side  Relieving factors: advil   PRECAUTIONS: None   WEIGHT BEARING RESTRICTIONS No   FALLS:  Has patient fallen in last 6 months? No       OCCUPATION: not currently working   PLOF: Independent   PATIENT GOALS hip - to be able to walk easier, to have less pain in her left shoulder     OBJECTIVE:    DIAGNOSTIC FINDINGS: 11/23/21 Left shoulder xray IMPRESSION: Minimal cortical sclerosis is identified in the lateral superior aspect of the left humerus. Otherwise no significant degenerative joint  changes noted.         COGNITION:           Overall cognitive status: Within functional limits for tasks assessed                          SENSATION: WFL     POSTURE: rounded shoulders, forward head, and flexed trunk    PALPATION: Tenderness to palpation along left biceps and triceps, hypo mobility with left shoulder in posterior direction              Cervical A/PROM:    11/29/2021      Flexion  0% limited     Extension 75% limited      R ROT  50% limited     L ROT  50% limited*     R SB 50% limited     L SB 50% limited                           * tight on ipsilateral side                  (Blank rows = not tested)                LE Measurements will assess in future  sessions       Lower Extremity Right 11/29/2021 Left 11/29/2021    A/PROM MMT A/PROM MMT  Hip Flexion          Hip Extension          Hip Abduction          Hip Adduction          Hip Internal rotation          Hip External rotation          Knee Flexion          Knee Extension          Ankle Dorsiflexion          Ankle Plantarflexion          Ankle Inversion          Ankle Eversion           (Blank rows = not tested)            * pain              UE Measurements       Upper Extremity Right 11/29/2021 Left 11/29/2021    A/PROM MMT A/PROM MMT  Shoulder Flexion 150/170 4 100* 3+  Shoulder Extension          Shoulder Abduction WFL (scaption) 4+ 105* 3+  Shoulder Adduction          Shoulder Internal Rotation Reaches to T12 SP /80 4+ Reaches to T12 SP /45 4+  Shoulder External Rotation Reaches to T1SP /95 4 Reaches to top of left shoulder /45 4-  Elbow Flexion          Elbow Extension          Wrist Flexion          Wrist Extension          Wrist Supination          Wrist Pronation          Wrist Ulnar Deviation          Wrist Radial Deviation  Grip Strength NA   NA                          (Blank rows = not tested)                       * pain Patient left handed.       TODAY'S TREATMENT: 12/06/2021 Therapeutic Exercise: Aerobic:pulley flexion 3 minutes Standing: Shoulder flexion at wall 3 minutes, corner pec stretch x0 15" holds Quadruped rocking into flexion 3 minutes Seated: Shoulder ER red band 3x10 5" holds B, shoulder retraction x30 5" holds B   Neuromuscular Re-education: Manual Therapy: GHJ traction with PROM in all directions and traction of left shoulder, gentle STM to left biceps and UT - tolerated well  Therapeutic Activity: Self Care: Trigger Point Dry Needling:  Modalities:      PATIENT EDUCATION:  Education details: on HEP Person educated: Patient Education method: Explanation, Media planner, and Handouts Education comprehension:  verbalized understanding     HOME EXERCISE PROGRAM: [id number]   ASSESSMENT:   CLINICAL IMPRESSION: 12/06/2021 Continued to progress exercises as tolerated. Improved ROM noted with flexion and reaching behind back. Educated patient on current presentation and plan moving forward. Educated patient on posture and overall positioning with exercises.   Eval: Patient complains of left shoulder pain and right hip pain. Session focused on assessment of left shoulder per patient request, will assess right hip in future. Patient with limitations in ROM and strength that are limiting overall function. Education patient in current presentation and patient would greatly benefit from skilled PT to improve overall function and QOL.     OBJECTIVE IMPAIRMENTS decreased activity tolerance, difficulty walking, decreased ROM, decreased strength, impaired UE functional use, postural dysfunction, and pain.    ACTIVITY LIMITATIONS lifting, reach over head, hygiene/grooming, and locomotion level   PARTICIPATION LIMITATIONS: driving and community activity   PERSONAL FACTORS Age are also affecting patient's functional outcome.    REHAB POTENTIAL: Good   CLINICAL DECISION MAKING: Stable/uncomplicated   EVALUATION COMPLEXITY: Low   GOALS: Goals reviewed with patient?  yes   SHORT TERM GOALS:   Patient will be independent in self management strategies to improve quality of life and functional outcomes. Baseline: new program Target date: 12/20/2021 Goal status: INITIAL   2.  Patient will report at least 50% improvement in overall symptoms and/or function to demonstrate improved functional mobility Baseline: 0% Target date: 12/20/2021 Goal status: INITIAL   3.  Patient will be able to demonstrate at least 120 degrees of active left shoulder flexion and abd to improve OH reaching  Baseline: see above Target date: 12/20/2021 Goal status: INITIAL             LONG TERM GOALS:   Patient will report at  least 75% improvement in overall symptoms and/or function to demonstrate improved functional mobility Baseline: 0% Target date: 12/27/2021 Goal status: INITIAL   2.  Patient will be able close car door without pain to improve ability to get in and out of the car.  Baseline: painful Target date: 12/27/2021 Goal status: INITIAL   3.  Patient will be able to walk around neighborhood for at least 30 minutes without pain limiting her to improve ability to walk while visiting her  son in [place]. Baseline: unable Target date: 12/27/2021 Goal status: INITIAL   PLAN: PT FREQUENCY: 2x/week   PT DURATION: 6 weeks   PLANNED INTERVENTIONS: Therapeutic exercises, Therapeutic activity,  Neuromuscular re-education, Balance training, Gait training, Patient/Family education, Self Care, Joint mobilization, Joint manipulation, Vestibular training, Orthotic/Fit training, Aquatic Therapy, Dry Needling, Electrical stimulation, Spinal manipulation, Spinal mobilization, Cryotherapy, Moist heat, Traction, Ultrasound, Ionotophoresis '4mg'$ /ml Dexamethasone, and Manual therapy   PLAN FOR NEXT SESSION: shoulder ROM and strengthening. assess right hip when appropriate, develop HEP for shoulder and hip      3:43 PM, 12/06/21 Jerene Pitch, DPT Physical Therapy with [facility]

## 2021-12-12 ENCOUNTER — Encounter: Payer: Medicare Other | Admitting: Physical Therapy

## 2021-12-13 ENCOUNTER — Ambulatory Visit (INDEPENDENT_AMBULATORY_CARE_PROVIDER_SITE_OTHER): Payer: Medicare Other | Admitting: Internal Medicine

## 2021-12-13 ENCOUNTER — Encounter: Payer: Self-pay | Admitting: Internal Medicine

## 2021-12-13 DIAGNOSIS — I10 Essential (primary) hypertension: Secondary | ICD-10-CM

## 2021-12-13 DIAGNOSIS — E785 Hyperlipidemia, unspecified: Secondary | ICD-10-CM

## 2021-12-13 DIAGNOSIS — R7303 Prediabetes: Secondary | ICD-10-CM

## 2021-12-13 DIAGNOSIS — H832X3 Labyrinthine dysfunction, bilateral: Secondary | ICD-10-CM | POA: Insufficient documentation

## 2021-12-13 LAB — LIPID PANEL
Cholesterol: 163 mg/dL (ref 0–200)
HDL: 50.4 mg/dL (ref >39.00)
LDL Cholesterol: 92 mg/dL (ref 0–99)
NonHDL: 112.7
Total CHOL/HDL Ratio: 3
Triglycerides: 102 mg/dL (ref 0.0–149.0)
VLDL: 20.4 mg/dL (ref 0.0–40.0)

## 2021-12-13 LAB — COMPREHENSIVE METABOLIC PANEL
ALT: 18 U/L (ref 0–35)
AST: 20 U/L (ref 0–37)
Albumin: 4 g/dL (ref 3.5–5.2)
Alkaline Phosphatase: 80 U/L (ref 39–117)
BUN: 29 mg/dL — ABNORMAL HIGH (ref 6–23)
CO2: 30 mEq/L (ref 19–32)
Calcium: 9.5 mg/dL (ref 8.4–10.5)
Chloride: 102 mEq/L (ref 96–112)
Creatinine, Ser: 0.7 mg/dL (ref 0.40–1.20)
GFR: 84.34 mL/min (ref >60.00)
Glucose, Bld: 91 mg/dL (ref 70–99)
Potassium: 4.1 mEq/L (ref 3.5–5.1)
Sodium: 139 mEq/L (ref 135–145)
Total Bilirubin: 0.5 mg/dL (ref 0.2–1.2)
Total Protein: 7.2 g/dL (ref 6.0–8.3)

## 2021-12-13 LAB — CBC
HCT: 39.5 % (ref 36.0–46.0)
Hemoglobin: 13.3 g/dL (ref 12.0–15.0)
MCHC: 33.8 g/dL (ref 30.0–36.0)
MCV: 94.9 fl (ref 78.0–100.0)
Platelets: 179 10*3/uL (ref 150.0–400.0)
RBC: 4.16 Mil/uL (ref 3.87–5.11)
RDW: 12.8 % (ref 11.5–15.5)
WBC: 6.6 10*3/uL (ref 4.0–10.5)

## 2021-12-13 MED ORDER — IRBESARTAN 150 MG PO TABS: 150.0000 mg | ORAL_TABLET | Freq: Every day | ORAL | 3 refills | Status: DC

## 2021-12-13 MED ORDER — PRAVASTATIN SODIUM 20 MG PO TABS
ORAL_TABLET | ORAL | 3 refills | Status: DC
Start: 2021-12-13 — End: 2023-05-04

## 2021-12-13 NOTE — Patient Instructions (Signed)
We will check the labs today. 

## 2021-12-13 NOTE — Assessment & Plan Note (Signed)
Checking CMP and if sugar elevated will add on HgA1c. Weight down 25 pounds since last year which should help.

## 2021-12-13 NOTE — Assessment & Plan Note (Signed)
Checking CMP and adjust irbesartan 150 mg daily. BP at goal.

## 2021-12-13 NOTE — Progress Notes (Signed)
   Subjective:   Patient ID: Dana Small, female    DOB: 10/22/1945, 76 y.o.   MRN: [id number]  HPI The patient is a 76 YO female coming in for follow up.  Review of Systems  Constitutional: Negative.   HENT: Negative.    Eyes: Negative.   Respiratory:  Negative for cough, chest tightness and shortness of breath.   Cardiovascular:  Negative for chest pain, palpitations and leg swelling.  Gastrointestinal:  Negative for abdominal distention, abdominal pain, constipation, diarrhea, nausea and vomiting.  Musculoskeletal: Negative.   Skin: Negative.   Neurological: Negative.   Psychiatric/Behavioral: Negative.      Objective:  Physical Exam Constitutional:      Appearance: She is well-developed.  HENT:     Head: Normocephalic and atraumatic.  Cardiovascular:     Rate and Rhythm: Normal rate and regular rhythm.  Pulmonary:     Effort: Pulmonary effort is normal. No respiratory distress.     Breath sounds: Normal breath sounds. No wheezing or rales.  Abdominal:     General: Bowel sounds are normal. There is no distension.     Palpations: Abdomen is soft.     Tenderness: There is no abdominal tenderness. There is no rebound.  Musculoskeletal:     Cervical back: Normal range of motion.  Skin:    General: Skin is warm and dry.  Neurological:     Mental Status: She is alert and oriented to person, place, and time.     Coordination: Coordination normal.     Vitals:   12/13/21 1354  BP: 130/70  Pulse: 63  Temp: 98.2 F (36.8 C)  TempSrc: Oral  SpO2: 99%  Weight: 160 lb (72.6 kg)  Height: '5\' 4"'$  (1.626 m)    Assessment & Plan:

## 2021-12-13 NOTE — Assessment & Plan Note (Signed)
Checking lipid panel and adjust as needed her pravastatin 20 mg daily.

## 2021-12-14 ENCOUNTER — Ambulatory Visit (INDEPENDENT_AMBULATORY_CARE_PROVIDER_SITE_OTHER): Payer: Medicare Other | Admitting: Physical Therapy

## 2021-12-14 ENCOUNTER — Encounter: Payer: Self-pay | Admitting: Physical Therapy

## 2021-12-14 DIAGNOSIS — M25512 Pain in left shoulder: Secondary | ICD-10-CM | POA: Diagnosis not present

## 2021-12-14 DIAGNOSIS — M25551 Pain in right hip: Secondary | ICD-10-CM

## 2021-12-14 NOTE — Therapy (Signed)
OUTPATIENT PHYSICAL THERAPY TREATMENT NOTE   Patient Name: EZELLE SURPRENANT MRN: [id number] DOB:03/08/1946, 76 y.o., female Today's Date: 12/14/2021   END OF SESSION:   PT End of Session - 12/14/21 1407     Visit Number 5    Number of Visits 12    Date for PT Re-Evaluation 01/09/22    Authorization Type medicare    Progress Note Due on Visit 10    PT Start Time 4098    PT Stop Time 1427    PT Time Calculation (min) 41 min              Past Medical History:  Diagnosis Date   Arthritis    Hypertension    Past Surgical History:  Procedure Laterality Date   COLONOSCOPY  06/11/2013   [facility]   TOE SURGERY     tooth implant     Patient Active Problem List   Diagnosis Date Noted   Vestibular hypofunction, bilateral 12/13/2021   Hyperlipidemia with target LDL less than 100 06/14/2020   Sensorineural hearing loss (SNHL) of both ears 05/26/2020   BPPV (benign paroxysmal positional vertigo), unspecified laterality 05/26/2020   Pre-diabetes 10/14/2019   Essential hypertension 06/18/2014   Osteoarthritis 06/18/2014   Routine general medical examination at a health care facility 06/18/2014      PCP: Pricilla Holm, MD   REFERRING PROVIDER: Gregor Hams, MD     REFERRING DIAG: [phone] (ICD-10-CM) - Chronic right hip pain M25.512,G89.29 (ICD-10-CM) - Chronic left shoulder pain   THERAPY DIAG:  Pain in right hip   Acute pain of left shoulder   Muscle weakness (generalized)   Rationale for Evaluation and Treatment Rehabilitation   ONSET DATE: 3 months for left shoulder   SUBJECTIVE:    SUBJECTIVE STATEMENT: 12/14/2021 States continued soreness in arm.    Eval: States that she has had arthritis  and she has arthritis in her right toe. Pain in her right hip hurts with tight pains or sitting funning on the couch. States she has pain with walking and she would like to walk better. States she is going to [place] for 2 months and she  wants to be able to walk easier.    Reports her pain in her left shoulder is new. States she thinks it is associated with opening her car door. States that she has difficulty raising her arms up overhead. Reports her shoulder is the bigger issue. Pain has gotten worse over the last month. States that she does water aerobics.      PERTINENT HISTORY: HTN, history of right hip pain and right "dancers" toe   PAIN:  Are you having pain? Yes: NPRS scale: 4/10 Pain location: left upper arm Pain description: achy Aggravating factors: water aerobics, bringing arm out to the side  Relieving factors: advil   PRECAUTIONS: None   WEIGHT BEARING RESTRICTIONS No   FALLS:  Has patient fallen in last 6 months? No       OCCUPATION: not currently working   PLOF: Independent   PATIENT GOALS hip - to be able to walk easier, to have less pain in her left shoulder     OBJECTIVE:    DIAGNOSTIC FINDINGS: 11/23/21 Left shoulder xray IMPRESSION: Minimal cortical sclerosis is identified in the lateral superior aspect of the left humerus. Otherwise no significant degenerative joint changes noted.         COGNITION:           Overall cognitive status: Within  functional limits for tasks assessed                          SENSATION: WFL     POSTURE: rounded shoulders, forward head, and flexed trunk    PALPATION: Tenderness to palpation along left biceps and triceps, hypo mobility with left shoulder in posterior direction              Cervical A/PROM:    11/29/2021      Flexion  0% limited     Extension 75% limited      R ROT  50% limited     L ROT  50% limited*     R SB 50% limited     L SB 50% limited                           * tight on ipsilateral side                  (Blank rows = not tested)                LE Measurements will assess in future sessions       Lower Extremity Right 11/29/2021 Left 11/29/2021    A/PROM MMT A/PROM MMT  Hip Flexion          Hip Extension           Hip Abduction          Hip Adduction          Hip Internal rotation          Hip External rotation          Knee Flexion          Knee Extension          Ankle Dorsiflexion          Ankle Plantarflexion          Ankle Inversion          Ankle Eversion           (Blank rows = not tested)            * pain              UE Measurements       Upper Extremity Right 11/29/2021 Left 11/29/2021    A/PROM MMT A/PROM MMT  Shoulder Flexion 150/170 4 100* 3+  Shoulder Extension          Shoulder Abduction WFL (scaption) 4+ 105* 3+  Shoulder Adduction          Shoulder Internal Rotation Reaches to T12 SP /80 4+ Reaches to T12 SP /45 4+  Shoulder External Rotation Reaches to T1SP /95 4 Reaches to top of left shoulder /45 4-  Elbow Flexion          Elbow Extension          Wrist Flexion          Wrist Extension          Wrist Supination          Wrist Pronation          Wrist Ulnar Deviation          Wrist Radial Deviation          Grip Strength NA   NA                          (  Blank rows = not tested)                       * pain Patient left handed.       TODAY'S TREATMENT: 12/14/2021 Therapeutic Exercise: Supine:  shoulder flexion AAROM/cane x 10;  S/L : ER on R, 2x10 2 lb ;  Standing: Shoulder flexion at wall 3 minutes, 2 hands;   corner pec stretch 4x 15" holds Seated: Shoulder ER red band 3x10 5" holds B, shoulder retraction x30 5" holds B; Shoulder ER ROM x 10; pulley/flexion x 20;    Neuromuscular Re-education: Manual Therapy:  PROM in all directions , LAD, GHJ mobs post and inf.      Previous:  Aerobic:pulley flexion 3 minutes Standing: Shoulder flexion at wall 3 minutes, 2 hands;   corner pec stretch x0 15" holds Quadruped rocking into flexion 3 minutes Seated: Shoulder ER red band 3x10 5" holds B, shoulder retraction x30 5" holds B   Neuromuscular Re-education: Manual Therapy: GHJ traction with PROM in all directions and traction of left shoulder, gentle STM  to left biceps and UT - tolerated well      PATIENT EDUCATION:  Education details: on HEP Person educated: Patient Education method: Consulting civil engineer, Media planner, and Handouts Education comprehension: verbalized understanding     HOME EXERCISE PROGRAM: [id number]   ASSESSMENT:   CLINICAL IMPRESSION: 12/14/2021 Continued focus on restoring full ROM, and light strengthening. Pt with most soreness with abduction motions. Discussed not over doing activity with arm at water aerobics or with IADLs. Will progress as tolerated.   Eval: Patient complains of left shoulder pain and right hip pain. Session focused on assessment of left shoulder per patient request, will assess right hip in future. Patient with limitations in ROM and strength that are limiting overall function. Education patient in current presentation and patient would greatly benefit from skilled PT to improve overall function and QOL.     OBJECTIVE IMPAIRMENTS decreased activity tolerance, difficulty walking, decreased ROM, decreased strength, impaired UE functional use, postural dysfunction, and pain.    ACTIVITY LIMITATIONS lifting, reach over head, hygiene/grooming, and locomotion level   PARTICIPATION LIMITATIONS: driving and community activity   PERSONAL FACTORS Age are also affecting patient's functional outcome.    REHAB POTENTIAL: Good   CLINICAL DECISION MAKING: Stable/uncomplicated   EVALUATION COMPLEXITY: Low   GOALS: Goals reviewed with patient?  yes   SHORT TERM GOALS:   Patient will be independent in self management strategies to improve quality of life and functional outcomes. Baseline: new program Target date: 12/20/2021 Goal status: INITIAL   2.  Patient will report at least 50% improvement in overall symptoms and/or function to demonstrate improved functional mobility Baseline: 0% Target date: 12/20/2021 Goal status: INITIAL   3.  Patient will be able to demonstrate at least 120 degrees of active  left shoulder flexion and abd to improve OH reaching  Baseline: see above Target date: 12/20/2021 Goal status: INITIAL             LONG TERM GOALS:   Patient will report at least 75% improvement in overall symptoms and/or function to demonstrate improved functional mobility Baseline: 0% Target date: 12/27/2021 Goal status: INITIAL   2.  Patient will be able close car door without pain to improve ability to get in and out of the car.  Baseline: painful Target date: 12/27/2021 Goal status: INITIAL   3.  Patient will be able to walk around neighborhood for at least  30 minutes without pain limiting her to improve ability to walk while visiting her  son in [place]. Baseline: unable Target date: 12/27/2021 Goal status: INITIAL   PLAN: PT FREQUENCY: 2x/week   PT DURATION: 6 weeks   PLANNED INTERVENTIONS: Therapeutic exercises, Therapeutic activity, Neuromuscular re-education, Balance training, Gait training, Patient/Family education, Self Care, Joint mobilization, Joint manipulation, Vestibular training, Orthotic/Fit training, Aquatic Therapy, Dry Needling, Electrical stimulation, Spinal manipulation, Spinal mobilization, Cryotherapy, Moist heat, Traction, Ultrasound, Ionotophoresis '4mg'$ /ml Dexamethasone, and Manual therapy   PLAN FOR NEXT SESSION: shoulder ROM and strengthening. assess right hip when appropriate, develop HEP for shoulder and hip      Lyndee Hensen, PT, DPT 5:07 PM  12/14/21

## 2021-12-15 NOTE — Therapy (Deleted)
OUTPATIENT PHYSICAL THERAPY TREATMENT NOTE   Patient Name: Dana Small MRN: [id number] DOB:1945-07-18, 76 y.o., female Today's Date: 12/15/2021   END OF SESSION:      Past Medical History:  Diagnosis Date   Arthritis    Hypertension    Past Surgical History:  Procedure Laterality Date   COLONOSCOPY  06/11/2013   [facility]   TOE SURGERY     tooth implant     Patient Active Problem List   Diagnosis Date Noted   Vestibular hypofunction, bilateral 12/13/2021   Hyperlipidemia with target LDL less than 100 06/14/2020   Sensorineural hearing loss (SNHL) of both ears 05/26/2020   BPPV (benign paroxysmal positional vertigo), unspecified laterality 05/26/2020   Pre-diabetes 10/14/2019   Essential hypertension 06/18/2014   Osteoarthritis 06/18/2014   Routine general medical examination at a health care facility 06/18/2014      PCP: Pricilla Holm, MD   REFERRING PROVIDER: Gregor Hams, MD     REFERRING DIAG: [phone] (ICD-10-CM) - Chronic right hip pain M25.512,G89.29 (ICD-10-CM) - Chronic left shoulder pain   THERAPY DIAG:  Pain in right hip   Acute pain of left shoulder   Muscle weakness (generalized)   Rationale for Evaluation and Treatment Rehabilitation   ONSET DATE: 3 months for left shoulder   SUBJECTIVE:    SUBJECTIVE STATEMENT: 12/15/2021 States continued soreness in arm.    Eval: States that she has had arthritis  and she has arthritis in her right toe. Pain in her right hip hurts with tight pains or sitting funning on the couch. States she has pain with walking and she would like to walk better. States she is going to [place] for 2 months and she wants to be able to walk easier.    Reports her pain in her left shoulder is new. States she thinks it is associated with opening her car door. States that she has difficulty raising her arms up overhead. Reports her shoulder is the bigger issue. Pain has gotten worse over the  last month. States that she does water aerobics.      PERTINENT HISTORY: HTN, history of right hip pain and right "dancers" toe   PAIN:  Are you having pain? Yes: NPRS scale: 4/10 Pain location: left upper arm Pain description: achy Aggravating factors: water aerobics, bringing arm out to the side  Relieving factors: advil   PRECAUTIONS: None   WEIGHT BEARING RESTRICTIONS No   FALLS:  Has patient fallen in last 6 months? No       OCCUPATION: not currently working   PLOF: Independent   PATIENT GOALS hip - to be able to walk easier, to have less pain in her left shoulder     OBJECTIVE:    DIAGNOSTIC FINDINGS: 11/23/21 Left shoulder xray IMPRESSION: Minimal cortical sclerosis is identified in the lateral superior aspect of the left humerus. Otherwise no significant degenerative joint changes noted.         COGNITION:           Overall cognitive status: Within functional limits for tasks assessed                          SENSATION: WFL     POSTURE: rounded shoulders, forward head, and flexed trunk    PALPATION: Tenderness to palpation along left biceps and triceps, hypo mobility with left shoulder in posterior direction  Cervical A/PROM:    11/29/2021      Flexion  0% limited     Extension 75% limited      R ROT  50% limited     L ROT  50% limited*     R SB 50% limited     L SB 50% limited                           * tight on ipsilateral side                  (Blank rows = not tested)                LE Measurements will assess in future sessions       Lower Extremity Right 11/29/2021 Left 11/29/2021    A/PROM MMT A/PROM MMT  Hip Flexion          Hip Extension          Hip Abduction          Hip Adduction          Hip Internal rotation          Hip External rotation          Knee Flexion          Knee Extension          Ankle Dorsiflexion          Ankle Plantarflexion          Ankle Inversion          Ankle Eversion           (Blank  rows = not tested)            * pain              UE Measurements       Upper Extremity Right 11/29/2021 Left 11/29/2021    A/PROM MMT A/PROM MMT  Shoulder Flexion 150/170 4 100* 3+  Shoulder Extension          Shoulder Abduction WFL (scaption) 4+ 105* 3+  Shoulder Adduction          Shoulder Internal Rotation Reaches to T12 SP /80 4+ Reaches to T12 SP /45 4+  Shoulder External Rotation Reaches to T1SP /95 4 Reaches to top of left shoulder /45 4-  Elbow Flexion          Elbow Extension          Wrist Flexion          Wrist Extension          Wrist Supination          Wrist Pronation          Wrist Ulnar Deviation          Wrist Radial Deviation          Grip Strength NA   NA                          (Blank rows = not tested)                       * pain Patient left handed.       TODAY'S TREATMENT: 12/15/2021 Therapeutic Exercise: Supine:  shoulder flexion AAROM/cane x 10;  S/L : ER on R, 2x10 2 lb ;  Standing: Shoulder flexion at wall 3 minutes,  2 hands;   corner pec stretch 4x 15" holds Seated: Shoulder ER red band 3x10 5" holds B, shoulder retraction x30 5" holds B; Shoulder ER ROM x 10; pulley/flexion x 20;    Neuromuscular Re-education: Manual Therapy:  PROM in all directions , LAD, GHJ mobs post and inf.      Previous:  Aerobic:pulley flexion 3 minutes Standing: Shoulder flexion at wall 3 minutes, 2 hands;   corner pec stretch x0 15" holds Quadruped rocking into flexion 3 minutes Seated: Shoulder ER red band 3x10 5" holds B, shoulder retraction x30 5" holds B   Neuromuscular Re-education: Manual Therapy: GHJ traction with PROM in all directions and traction of left shoulder, gentle STM to left biceps and UT - tolerated well      PATIENT EDUCATION:  Education details: on HEP Person educated: Patient Education method: Consulting civil engineer, Media planner, and Handouts Education comprehension: verbalized understanding     HOME EXERCISE PROGRAM: [id number]    ASSESSMENT:   CLINICAL IMPRESSION: 12/15/2021 Continued focus on restoring full ROM, and light strengthening. Pt with most soreness with abduction motions. Discussed not over doing activity with arm at water aerobics or with IADLs. Will progress as tolerated.   Eval: Patient complains of left shoulder pain and right hip pain. Session focused on assessment of left shoulder per patient request, will assess right hip in future. Patient with limitations in ROM and strength that are limiting overall function. Education patient in current presentation and patient would greatly benefit from skilled PT to improve overall function and QOL.     OBJECTIVE IMPAIRMENTS decreased activity tolerance, difficulty walking, decreased ROM, decreased strength, impaired UE functional use, postural dysfunction, and pain.    ACTIVITY LIMITATIONS lifting, reach over head, hygiene/grooming, and locomotion level   PARTICIPATION LIMITATIONS: driving and community activity   PERSONAL FACTORS Age are also affecting patient's functional outcome.    REHAB POTENTIAL: Good   CLINICAL DECISION MAKING: Stable/uncomplicated   EVALUATION COMPLEXITY: Low   GOALS: Goals reviewed with patient?  yes   SHORT TERM GOALS:   Patient will be independent in self management strategies to improve quality of life and functional outcomes. Baseline: new program Target date: 12/20/2021 Goal status: INITIAL   2.  Patient will report at least 50% improvement in overall symptoms and/or function to demonstrate improved functional mobility Baseline: 0% Target date: 12/20/2021 Goal status: INITIAL   3.  Patient will be able to demonstrate at least 120 degrees of active left shoulder flexion and abd to improve OH reaching  Baseline: see above Target date: 12/20/2021 Goal status: INITIAL             LONG TERM GOALS:   Patient will report at least 75% improvement in overall symptoms and/or function to demonstrate improved functional  mobility Baseline: 0% Target date: 12/27/2021 Goal status: INITIAL   2.  Patient will be able close car door without pain to improve ability to get in and out of the car.  Baseline: painful Target date: 12/27/2021 Goal status: INITIAL   3.  Patient will be able to walk around neighborhood for at least 30 minutes without pain limiting her to improve ability to walk while visiting her  son in [place]. Baseline: unable Target date: 12/27/2021 Goal status: INITIAL   PLAN: PT FREQUENCY: 2x/week   PT DURATION: 6 weeks   PLANNED INTERVENTIONS: Therapeutic exercises, Therapeutic activity, Neuromuscular re-education, Balance training, Gait training, Patient/Family education, Self Care, Joint mobilization, Joint manipulation, Vestibular training, Orthotic/Fit training, Aquatic Therapy, Dry Needling,  Electrical stimulation, Spinal manipulation, Spinal mobilization, Cryotherapy, Moist heat, Traction, Ultrasound, Ionotophoresis '4mg'$ /ml Dexamethasone, and Manual therapy   PLAN FOR NEXT SESSION: shoulder ROM and strengthening. assess right hip when appropriate, develop HEP for shoulder and hip      Rudi Heap PT, DPT 12/15/21  10:46 AM

## 2021-12-16 ENCOUNTER — Ambulatory Visit (INDEPENDENT_AMBULATORY_CARE_PROVIDER_SITE_OTHER): Payer: Medicare Other | Admitting: Physical Therapy

## 2021-12-16 ENCOUNTER — Encounter: Payer: Self-pay | Admitting: Physical Therapy

## 2021-12-16 DIAGNOSIS — M25551 Pain in right hip: Secondary | ICD-10-CM

## 2021-12-16 DIAGNOSIS — M25512 Pain in left shoulder: Secondary | ICD-10-CM

## 2021-12-16 NOTE — Therapy (Signed)
OUTPATIENT PHYSICAL THERAPY TREATMENT NOTE   Patient Name: Dana Small MRN: [id number] DOB:01-Sep-1945, 76 y.o., female Today's Date: 12/16/2021   END OF SESSION:   PT End of Session - 12/16/21 1125     Visit Number 6    Number of Visits 12    Date for PT Re-Evaluation 01/09/22    Authorization Type medicare    Progress Note Due on Visit 10    PT Start Time 1105    PT Stop Time 1145    PT Time Calculation (min) 40 min    Activity Tolerance Patient tolerated treatment well    Behavior During Therapy [facility] for tasks assessed/performed               Past Medical History:  Diagnosis Date   Arthritis    Hypertension    Past Surgical History:  Procedure Laterality Date   COLONOSCOPY  06/11/2013   [facility]   TOE SURGERY     tooth implant     Patient Active Problem List   Diagnosis Date Noted   Vestibular hypofunction, bilateral 12/13/2021   Hyperlipidemia with target LDL less than 100 06/14/2020   Sensorineural hearing loss (SNHL) of both ears 05/26/2020   BPPV (benign paroxysmal positional vertigo), unspecified laterality 05/26/2020   Pre-diabetes 10/14/2019   Essential hypertension 06/18/2014   Osteoarthritis 06/18/2014   Routine general medical examination at a health care facility 06/18/2014      PCP: Pricilla Holm, MD   REFERRING PROVIDER: Gregor Hams, MD     REFERRING DIAG: [phone] (ICD-10-CM) - Chronic right hip pain M25.512,G89.29 (ICD-10-CM) - Chronic left shoulder pain   THERAPY DIAG:  Pain in right hip   Acute pain of left shoulder   Muscle weakness (generalized)   Rationale for Evaluation and Treatment Rehabilitation   ONSET DATE: 3 months for left shoulder   SUBJECTIVE:    SUBJECTIVE STATEMENT: 12/16/2021 States sometimes arm hurts and sometimes it feels a little better.    Eval: States that she has had arthritis  and she has arthritis in her right toe. Pain in her right hip hurts with tight pains or  sitting funning on the couch. States she has pain with walking and she would like to walk better. States she is going to [place] for 2 months and she wants to be able to walk easier.    Reports her pain in her left shoulder is new. States she thinks it is associated with opening her car door. States that she has difficulty raising her arms up overhead. Reports her shoulder is the bigger issue. Pain has gotten worse over the last month. States that she does water aerobics.      PERTINENT HISTORY: HTN, history of right hip pain and right "dancers" toe   PAIN:  Are you having pain? Yes: NPRS scale: 4/10 Pain location: left upper arm Pain description: achy Aggravating factors: water aerobics, bringing arm out to the side  Relieving factors: advil   PRECAUTIONS: None   WEIGHT BEARING RESTRICTIONS No   FALLS:  Has patient fallen in last 6 months? No       OCCUPATION: not currently working   PLOF: Independent   PATIENT GOALS hip - to be able to walk easier, to have less pain in her left shoulder     OBJECTIVE:    DIAGNOSTIC FINDINGS: 11/23/21 Left shoulder xray IMPRESSION: Minimal cortical sclerosis is identified in the lateral superior aspect of the left humerus. Otherwise no significant degenerative  joint changes noted.     COGNITION:           Overall cognitive status: Within functional limits for tasks assessed                          SENSATION: WFL     POSTURE: rounded shoulders, forward head, and flexed trunk    PALPATION: Tenderness to palpation along left biceps and triceps, hypo mobility with left shoulder in posterior direction              Cervical A/PROM:    11/29/2021      Flexion  0% limited     Extension 75% limited      R ROT  50% limited     L ROT  50% limited*     R SB 50% limited     L SB 50% limited                           * tight on ipsilateral side                  (Blank rows = not tested)                LE Measurements will assess in  future sessions       Lower Extremity Right 11/29/2021 Left 11/29/2021    A/PROM MMT A/PROM MMT  Hip Flexion          Hip Extension          Hip Abduction          Hip Adduction          Hip Internal rotation          Hip External rotation          Knee Flexion          Knee Extension          Ankle Dorsiflexion          Ankle Plantarflexion          Ankle Inversion          Ankle Eversion           (Blank rows = not tested)            * pain              UE Measurements       Upper Extremity Right 11/29/2021 Left 11/29/2021    A/PROM MMT A/PROM MMT  Shoulder Flexion 150/170 4 100* 3+  Shoulder Extension          Shoulder Abduction WFL (scaption) 4+ 105* 3+  Shoulder Adduction          Shoulder Internal Rotation Reaches to T12 SP /80 4+ Reaches to T12 SP /45 4+  Shoulder External Rotation Reaches to T1SP /95 4 Reaches to top of left shoulder /45 4-  Elbow Flexion          Elbow Extension          Wrist Flexion          Wrist Extension          Wrist Supination          Wrist Pronation          Wrist Ulnar Deviation          Wrist Radial Deviation  Grip Strength NA   NA                          (Blank rows = not tested)                       * pain Patient left handed.       TODAY'S TREATMENT:  12/16/2021 Therapeutic Exercise: Supine:  shoulder flexion AAROM/cane x 15; ER at 90 /90 ROM x 15; ER butterfly x 15;  S/L : ER on R, 2x10 2 lb ;  Standing: Shoulder flexion at wall 3 minutes, 1 hans;   doorway4x 15" holds; Rows GTB x 20;  Bil Shoulder ER YTB x 20;  Seated: pulley/flexion x 2 min, abd x 1 min;    Neuromuscular Re-education: Manual Therapy:  PROM in all directions , LAD, GHJ mobs post and inf.      Previous:  Aerobic:pulley flexion 3 minutes Standing: Shoulder flexion at wall 3 minutes, 2 hands;   corner pec stretch x0 15" holds Quadruped rocking into flexion 3 minutes Seated: Shoulder ER red band 3x10 5" holds B, shoulder retraction x30 5"  holds B   Neuromuscular Re-education: Manual Therapy: GHJ traction with PROM in all directions and traction of left shoulder, gentle STM to left biceps and UT - tolerated well      PATIENT EDUCATION:  Education details: on HEP Person educated: Patient Education method: Consulting civil engineer, Media planner, and Handouts Education comprehension: verbalized understanding     HOME EXERCISE PROGRAM: [id number]   ASSESSMENT:   CLINICAL IMPRESSION: 12/16/2021 Pt with some soreness with manual/ PROM at end range for flexion and ER, still has stiffness, will benefit from manual and focus on restoring ROM. Good tolerance for light strengthening, plan to progress as tolerated.   Eval: Patient complains of left shoulder pain and right hip pain. Session focused on assessment of left shoulder per patient request, will assess right hip in future. Patient with limitations in ROM and strength that are limiting overall function. Education patient in current presentation and patient would greatly benefit from skilled PT to improve overall function and QOL.     OBJECTIVE IMPAIRMENTS decreased activity tolerance, difficulty walking, decreased ROM, decreased strength, impaired UE functional use, postural dysfunction, and pain.    ACTIVITY LIMITATIONS lifting, reach over head, hygiene/grooming, and locomotion level   PARTICIPATION LIMITATIONS: driving and community activity   PERSONAL FACTORS Age are also affecting patient's functional outcome.    REHAB POTENTIAL: Good   CLINICAL DECISION MAKING: Stable/uncomplicated   EVALUATION COMPLEXITY: Low   GOALS: Goals reviewed with patient?  yes   SHORT TERM GOALS:   Patient will be independent in self management strategies to improve quality of life and functional outcomes. Baseline: new program Target date: 12/20/2021 Goal status: INITIAL   2.  Patient will report at least 50% improvement in overall symptoms and/or function to demonstrate improved functional  mobility Baseline: 0% Target date: 12/20/2021 Goal status: INITIAL   3.  Patient will be able to demonstrate at least 120 degrees of active left shoulder flexion and abd to improve OH reaching  Baseline: see above Target date: 12/20/2021 Goal status: INITIAL        LONG TERM GOALS:   Patient will report at least 75% improvement in overall symptoms and/or function to demonstrate improved functional mobility Baseline: 0% Target date: 12/27/2021 Goal status: INITIAL   2.  Patient will be able close car door without  pain to improve ability to get in and out of the car.  Baseline: painful Target date: 12/27/2021 Goal status: INITIAL   3.  Patient will be able to walk around neighborhood for at least 30 minutes without pain limiting her to improve ability to walk while visiting her  son in [place]. Baseline: unable Target date: 12/27/2021 Goal status: INITIAL   PLAN: PT FREQUENCY: 2x/week   PT DURATION: 6 weeks   PLANNED INTERVENTIONS: Therapeutic exercises, Therapeutic activity, Neuromuscular re-education, Balance training, Gait training, Patient/Family education, Self Care, Joint mobilization, Joint manipulation, Vestibular training, Orthotic/Fit training, Aquatic Therapy, Dry Needling, Electrical stimulation, Spinal manipulation, Spinal mobilization, Cryotherapy, Moist heat, Traction, Ultrasound, Ionotophoresis '4mg'$ /ml Dexamethasone, and Manual therapy   PLAN FOR NEXT SESSION: shoulder ROM and strengthening. assess right hip when appropriate, develop HEP for shoulder and hip      Lyndee Hensen, PT, DPT 1:09 PM  12/16/21

## 2021-12-20 ENCOUNTER — Encounter: Payer: Self-pay | Admitting: Physical Therapy

## 2021-12-20 ENCOUNTER — Ambulatory Visit (INDEPENDENT_AMBULATORY_CARE_PROVIDER_SITE_OTHER): Payer: Medicare Other | Admitting: Physical Therapy

## 2021-12-20 DIAGNOSIS — M25512 Pain in left shoulder: Secondary | ICD-10-CM | POA: Diagnosis not present

## 2021-12-20 DIAGNOSIS — M25551 Pain in right hip: Secondary | ICD-10-CM | POA: Diagnosis not present

## 2021-12-20 DIAGNOSIS — M6281 Muscle weakness (generalized): Secondary | ICD-10-CM

## 2021-12-20 NOTE — Therapy (Signed)
OUTPATIENT PHYSICAL THERAPY TREATMENT NOTE   Patient Name: Dana Small MRN: [id number] DOB:1946/02/06, 76 y.o., female Today's Date: 12/20/2021   END OF SESSION:   PT End of Session - 12/20/21 1310     Visit Number 8    Number of Visits 12    Date for PT Re-Evaluation 01/09/22    Authorization Type medicare    Progress Note Due on Visit 10    PT Start Time 1300    PT Stop Time 1343    PT Time Calculation (min) 43 min    Activity Tolerance Patient tolerated treatment well    Behavior During Therapy [facility] for tasks assessed/performed                Past Medical History:  Diagnosis Date   Arthritis    Hypertension    Past Surgical History:  Procedure Laterality Date   COLONOSCOPY  06/11/2013   [facility]   TOE SURGERY     tooth implant     Patient Active Problem List   Diagnosis Date Noted   Vestibular hypofunction, bilateral 12/13/2021   Hyperlipidemia with target LDL less than 100 06/14/2020   Sensorineural hearing loss (SNHL) of both ears 05/26/2020   BPPV (benign paroxysmal positional vertigo), unspecified laterality 05/26/2020   Pre-diabetes 10/14/2019   Essential hypertension 06/18/2014   Osteoarthritis 06/18/2014   Routine general medical examination at a health care facility 06/18/2014      PCP: Pricilla Holm, MD   REFERRING PROVIDER: Gregor Hams, MD     REFERRING DIAG: [phone] (ICD-10-CM) - Chronic right hip pain M25.512,G89.29 (ICD-10-CM) - Chronic left shoulder pain   THERAPY DIAG:  Pain in right hip   Acute pain of left shoulder   Muscle weakness (generalized)   Rationale for Evaluation and Treatment Rehabilitation   ONSET DATE: 3 months for left shoulder   SUBJECTIVE:    SUBJECTIVE STATEMENT: 12/20/2021 Pt states that she is feeling good today. She did thaichi today which is a lot of stretching.    Eval: States that she has had arthritis  and she has arthritis in her right toe. Pain in her right  hip hurts with tight pains or sitting funning on the couch. States she has pain with walking and she would like to walk better. States she is going to [place] for 2 months and she wants to be able to walk easier.    Reports her pain in her left shoulder is new. States she thinks it is associated with opening her car door. States that she has difficulty raising her arms up overhead. Reports her shoulder is the bigger issue. Pain has gotten worse over the last month. States that she does water aerobics.      PERTINENT HISTORY: HTN, history of right hip pain and right "dancers" toe   PAIN:  Are you having pain? Yes: NPRS scale: 4/10 Pain location: left upper arm Pain description: achy Aggravating factors: water aerobics, bringing arm out to the side  Relieving factors: advil   PRECAUTIONS: None   WEIGHT BEARING RESTRICTIONS No   FALLS:  Has patient fallen in last 6 months? No       OCCUPATION: not currently working   PLOF: Independent   PATIENT GOALS hip - to be able to walk easier, to have less pain in her left shoulder     OBJECTIVE:    DIAGNOSTIC FINDINGS: 11/23/21 Left shoulder xray IMPRESSION: Minimal cortical sclerosis is identified in the lateral superior aspect  of the left humerus. Otherwise no significant degenerative joint changes noted.     COGNITION:           Overall cognitive status: Within functional limits for tasks assessed                          SENSATION: WFL     POSTURE: rounded shoulders, forward head, and flexed trunk    PALPATION: Tenderness to palpation along left biceps and triceps, hypo mobility with left shoulder in posterior direction              Cervical A/PROM:    11/29/2021      Flexion  0% limited     Extension 75% limited      R ROT  50% limited     L ROT  50% limited*     R SB 50% limited     L SB 50% limited                           * tight on ipsilateral side                  (Blank rows = not tested)                LE  Measurements will assess in future sessions       Lower Extremity Right 11/29/2021 Left 11/29/2021    A/PROM MMT A/PROM MMT  Hip Flexion          Hip Extension          Hip Abduction          Hip Adduction          Hip Internal rotation          Hip External rotation          Knee Flexion          Knee Extension          Ankle Dorsiflexion          Ankle Plantarflexion          Ankle Inversion          Ankle Eversion           (Blank rows = not tested)            * pain              UE Measurements       Upper Extremity Right 11/29/2021 Left 11/29/2021    A/PROM MMT A/PROM MMT  Shoulder Flexion 150/170 4 100* 3+  Shoulder Extension          Shoulder Abduction WFL (scaption) 4+ 105* 3+  Shoulder Adduction          Shoulder Internal Rotation Reaches to T12 SP /80 4+ Reaches to T12 SP /45 4+  Shoulder External Rotation Reaches to T1SP /95 4 Reaches to top of left shoulder /45 4-  Elbow Flexion          Elbow Extension          Wrist Flexion          Wrist Extension          Wrist Supination          Wrist Pronation          Wrist Ulnar Deviation          Wrist Radial Deviation  Grip Strength NA   NA                          (Blank rows = not tested)                       * pain Patient left handed.       TODAY'S TREATMENT:  12/20/2021  Seated: pulley/flexion x 2 min, abd x 1 min; bilat seated ER YTB x20 Standing: Shoulder flexion at wall 3 minutes, Rows GTB x 20;  Bil Shoulder ER YTB x 20; Co contraction theraball circles on wall x20 each way.   Previous:  Therapeutic Exercise: Supine:  shoulder flexion AAROM/cane x 15; ER at 90 /90 ROM x 15; ER butterfly x 15;  S/L : ER on R, 2x10 2 lb ;  Standing: Shoulder flexion at wall 3 minutes, 1 hans;   doorway4x 15" holds; Rows GTB x 20;  Bil Shoulder ER YTB x 20;  Seated: pulley/flexion x 2 min, abd x 1 min;    Neuromuscular Re-education: Manual Therapy:  PROM in all directions , LAD, GHJ mobs post and inf.    Aerobic:pulley flexion 3 minutes Standing: Shoulder flexion at wall 3 minutes, 2 hands;   corner pec stretch x0 15" holds Quadruped rocking into flexion 3 minutes Seated: Shoulder ER red band 3x10 5" holds B, shoulder retraction x30 5" holds B   Neuromuscular Re-education: Manual Therapy: GHJ traction with PROM in all directions and traction of left shoulder, gentle STM to left biceps and UT - tolerated well      PATIENT EDUCATION:  Education details: on HEP Person educated: Patient Education method: Consulting civil engineer, Media planner, and Handouts Education comprehension: verbalized understanding     HOME EXERCISE PROGRAM: [id number]   ASSESSMENT:   CLINICAL IMPRESSION: 12/20/2021 Pt presents with improvements in pain and functional ROM with OH reaching. She continues to demonstrate limitations due to pain with ER. Session with focus on parascapular strengthening and mobility. She fatigues quickly on L>R. Pt tolerated all prescribed exercises today with cues required for proper forum. Pt will continue to benefit from skilled PT to address continued deficits.   Eval: Patient complains of left shoulder pain and right hip pain. Session focused on assessment of left shoulder per patient request, will assess right hip in future. Patient with limitations in ROM and strength that are limiting overall function. Education patient in current presentation and patient would greatly benefit from skilled PT to improve overall function and QOL.     OBJECTIVE IMPAIRMENTS decreased activity tolerance, difficulty walking, decreased ROM, decreased strength, impaired UE functional use, postural dysfunction, and pain.    ACTIVITY LIMITATIONS lifting, reach over head, hygiene/grooming, and locomotion level   PARTICIPATION LIMITATIONS: driving and community activity   PERSONAL FACTORS Age are also affecting patient's functional outcome.    REHAB POTENTIAL: Good   CLINICAL DECISION MAKING:  Stable/uncomplicated   EVALUATION COMPLEXITY: Low   GOALS: Goals reviewed with patient?  yes   SHORT TERM GOALS:   Patient will be independent in self management strategies to improve quality of life and functional outcomes. Baseline: new program Target date: 12/20/2021 Goal status: INITIAL   2.  Patient will report at least 50% improvement in overall symptoms and/or function to demonstrate improved functional mobility Baseline: 0% Target date: 12/20/2021 Goal status: INITIAL   3.  Patient will be able to demonstrate at least 120 degrees of active left shoulder flexion and  abd to improve OH reaching  Baseline: see above Target date: 12/20/2021 Goal status: INITIAL        LONG TERM GOALS:   Patient will report at least 75% improvement in overall symptoms and/or function to demonstrate improved functional mobility Baseline: 0% Target date: 12/27/2021 Goal status: INITIAL   2.  Patient will be able close car door without pain to improve ability to get in and out of the car.  Baseline: painful Target date: 12/27/2021 Goal status: INITIAL   3.  Patient will be able to walk around neighborhood for at least 30 minutes without pain limiting her to improve ability to walk while visiting her  son in [place]. Baseline: unable Target date: 12/27/2021 Goal status: INITIAL   PLAN: PT FREQUENCY: 2x/week   PT DURATION: 6 weeks   PLANNED INTERVENTIONS: Therapeutic exercises, Therapeutic activity, Neuromuscular re-education, Balance training, Gait training, Patient/Family education, Self Care, Joint mobilization, Joint manipulation, Vestibular training, Orthotic/Fit training, Aquatic Therapy, Dry Needling, Electrical stimulation, Spinal manipulation, Spinal mobilization, Cryotherapy, Moist heat, Traction, Ultrasound, Ionotophoresis '4mg'$ /ml Dexamethasone, and Manual therapy   PLAN FOR NEXT SESSION: shoulder ROM and strengthening. assess right hip when appropriate, develop HEP for shoulder and  hip      Rudi Heap PT, DPT 12/20/21  2:47 PM

## 2021-12-20 NOTE — Therapy (Deleted)
OUTPATIENT PHYSICAL THERAPY TREATMENT NOTE   Patient Name: Dana Small MRN: [id number] DOB:1945-10-25, 76 y.o., female Today's Date: 12/20/2021   END OF SESSION:        Past Medical History:  Diagnosis Date   Arthritis    Hypertension    Past Surgical History:  Procedure Laterality Date   COLONOSCOPY  06/11/2013   [facility]   TOE SURGERY     tooth implant     Patient Active Problem List   Diagnosis Date Noted   Vestibular hypofunction, bilateral 12/13/2021   Hyperlipidemia with target LDL less than 100 06/14/2020   Sensorineural hearing loss (SNHL) of both ears 05/26/2020   BPPV (benign paroxysmal positional vertigo), unspecified laterality 05/26/2020   Pre-diabetes 10/14/2019   Essential hypertension 06/18/2014   Osteoarthritis 06/18/2014   Routine general medical examination at a health care facility 06/18/2014      PCP: Pricilla Holm, MD   REFERRING PROVIDER: Gregor Hams, MD     REFERRING DIAG: [phone] (ICD-10-CM) - Chronic right hip pain M25.512,G89.29 (ICD-10-CM) - Chronic left shoulder pain   THERAPY DIAG:  Pain in right hip   Acute pain of left shoulder   Muscle weakness (generalized)   Rationale for Evaluation and Treatment Rehabilitation   ONSET DATE: 3 months for left shoulder   SUBJECTIVE:    SUBJECTIVE STATEMENT: 12/20/2021 Pt states that she is feeling good today. She did thaichi today which is a lot of stretching.    Eval: States that she has had arthritis  and she has arthritis in her right toe. Pain in her right hip hurts with tight pains or sitting funning on the couch. States she has pain with walking and she would like to walk better. States she is going to [place] for 2 months and she wants to be able to walk easier.    Reports her pain in her left shoulder is new. States she thinks it is associated with opening her car door. States that she has difficulty raising her arms up overhead. Reports her  shoulder is the bigger issue. Pain has gotten worse over the last month. States that she does water aerobics.      PERTINENT HISTORY: HTN, history of right hip pain and right "dancers" toe   PAIN:  Are you having pain? Yes: NPRS scale: 4/10 Pain location: left upper arm Pain description: achy Aggravating factors: water aerobics, bringing arm out to the side  Relieving factors: advil   PRECAUTIONS: None   WEIGHT BEARING RESTRICTIONS No   FALLS:  Has patient fallen in last 6 months? No       OCCUPATION: not currently working   PLOF: Independent   PATIENT GOALS hip - to be able to walk easier, to have less pain in her left shoulder     OBJECTIVE:    DIAGNOSTIC FINDINGS: 11/23/21 Left shoulder xray IMPRESSION: Minimal cortical sclerosis is identified in the lateral superior aspect of the left humerus. Otherwise no significant degenerative joint changes noted.     COGNITION:           Overall cognitive status: Within functional limits for tasks assessed                          SENSATION: WFL     POSTURE: rounded shoulders, forward head, and flexed trunk    PALPATION: Tenderness to palpation along left biceps and triceps, hypo mobility with left shoulder in posterior direction  Cervical A/PROM:    11/29/2021      Flexion  0% limited     Extension 75% limited      R ROT  50% limited     L ROT  50% limited*     R SB 50% limited     L SB 50% limited                           * tight on ipsilateral side                  (Blank rows = not tested)                LE Measurements will assess in future sessions       Lower Extremity Right 11/29/2021 Left 11/29/2021    A/PROM MMT A/PROM MMT  Hip Flexion          Hip Extension          Hip Abduction          Hip Adduction          Hip Internal rotation          Hip External rotation          Knee Flexion          Knee Extension          Ankle Dorsiflexion          Ankle Plantarflexion           Ankle Inversion          Ankle Eversion           (Blank rows = not tested)            * pain              UE Measurements       Upper Extremity Right 11/29/2021 Left 11/29/2021    A/PROM MMT A/PROM MMT  Shoulder Flexion 150/170 4 100* 3+  Shoulder Extension          Shoulder Abduction WFL (scaption) 4+ 105* 3+  Shoulder Adduction          Shoulder Internal Rotation Reaches to T12 SP /80 4+ Reaches to T12 SP /45 4+  Shoulder External Rotation Reaches to T1SP /95 4 Reaches to top of left shoulder /45 4-  Elbow Flexion          Elbow Extension          Wrist Flexion          Wrist Extension          Wrist Supination          Wrist Pronation          Wrist Ulnar Deviation          Wrist Radial Deviation          Grip Strength NA   NA                          (Blank rows = not tested)                       * pain Patient left handed.       TODAY'S TREATMENT:  12/20/2021  Seated: pulley/flexion x 2 min, abd x 1 min; bilat seated ER YTB x20 Standing: Shoulder flexion at wall 3 minutes, Rows GTB x 20;  Bil Shoulder ER YTB x 20; Co contraction theraball circles on wall x20 each way.   Previous:  Therapeutic Exercise: Supine:  shoulder flexion AAROM/cane x 15; ER at 90 /90 ROM x 15; ER butterfly x 15;  S/L : ER on R, 2x10 2 lb ;  Standing: Shoulder flexion at wall 3 minutes, 1 hans;   doorway4x 15" holds; Rows GTB x 20;  Bil Shoulder ER YTB x 20;  Seated: pulley/flexion x 2 min, abd x 1 min;    Neuromuscular Re-education: Manual Therapy:  PROM in all directions , LAD, GHJ mobs post and inf.   Aerobic:pulley flexion 3 minutes Standing: Shoulder flexion at wall 3 minutes, 2 hands;   corner pec stretch x0 15" holds Quadruped rocking into flexion 3 minutes Seated: Shoulder ER red band 3x10 5" holds B, shoulder retraction x30 5" holds B   Neuromuscular Re-education: Manual Therapy: GHJ traction with PROM in all directions and traction of left shoulder, gentle STM to left biceps  and UT - tolerated well      PATIENT EDUCATION:  Education details: on HEP Person educated: Patient Education method: Consulting civil engineer, Media planner, and Handouts Education comprehension: verbalized understanding     HOME EXERCISE PROGRAM: [id number]   ASSESSMENT:   CLINICAL IMPRESSION: 12/20/2021 Pt presents with improvements in pain and functional ROM with OH reaching. She continues to demonstrate limitations due to pain with ER. Session with focus on parascapular strengthening and mobility. She fatigues quickly on L>R. Pt tolerated all prescribed exercises today with cues required for proper forum. Pt will continue to benefit from skilled PT to address continued deficits.   Eval: Patient complains of left shoulder pain and right hip pain. Session focused on assessment of left shoulder per patient request, will assess right hip in future. Patient with limitations in ROM and strength that are limiting overall function. Education patient in current presentation and patient would greatly benefit from skilled PT to improve overall function and QOL.     OBJECTIVE IMPAIRMENTS decreased activity tolerance, difficulty walking, decreased ROM, decreased strength, impaired UE functional use, postural dysfunction, and pain.    ACTIVITY LIMITATIONS lifting, reach over head, hygiene/grooming, and locomotion level   PARTICIPATION LIMITATIONS: driving and community activity   PERSONAL FACTORS Age are also affecting patient's functional outcome.    REHAB POTENTIAL: Good   CLINICAL DECISION MAKING: Stable/uncomplicated   EVALUATION COMPLEXITY: Low   GOALS: Goals reviewed with patient?  yes   SHORT TERM GOALS:   Patient will be independent in self management strategies to improve quality of life and functional outcomes. Baseline: new program Target date: 12/20/2021 Goal status: INITIAL   2.  Patient will report at least 50% improvement in overall symptoms and/or function to demonstrate improved  functional mobility Baseline: 0% Target date: 12/20/2021 Goal status: INITIAL   3.  Patient will be able to demonstrate at least 120 degrees of active left shoulder flexion and abd to improve OH reaching  Baseline: see above Target date: 12/20/2021 Goal status: INITIAL        LONG TERM GOALS:   Patient will report at least 75% improvement in overall symptoms and/or function to demonstrate improved functional mobility Baseline: 0% Target date: 12/27/2021 Goal status: INITIAL   2.  Patient will be able close car door without pain to improve ability to get in and out of the car.  Baseline: painful Target date: 12/27/2021 Goal status: INITIAL   3.  Patient will be able to walk around neighborhood for at least  30 minutes without pain limiting her to improve ability to walk while visiting her  son in [place]. Baseline: unable Target date: 12/27/2021 Goal status: INITIAL   PLAN: PT FREQUENCY: 2x/week   PT DURATION: 6 weeks   PLANNED INTERVENTIONS: Therapeutic exercises, Therapeutic activity, Neuromuscular re-education, Balance training, Gait training, Patient/Family education, Self Care, Joint mobilization, Joint manipulation, Vestibular training, Orthotic/Fit training, Aquatic Therapy, Dry Needling, Electrical stimulation, Spinal manipulation, Spinal mobilization, Cryotherapy, Moist heat, Traction, Ultrasound, Ionotophoresis '4mg'$ /ml Dexamethasone, and Manual therapy   PLAN FOR NEXT SESSION: shoulder ROM and strengthening. assess right hip when appropriate, develop HEP for shoulder and hip      Rudi Heap PT, DPT 12/20/21  3:09 PM

## 2021-12-22 ENCOUNTER — Encounter: Payer: Medicare Other | Admitting: Physical Therapy

## 2021-12-23 ENCOUNTER — Ambulatory Visit (INDEPENDENT_AMBULATORY_CARE_PROVIDER_SITE_OTHER): Payer: Medicare Other | Admitting: Physical Therapy

## 2021-12-23 ENCOUNTER — Encounter: Payer: Self-pay | Admitting: Physical Therapy

## 2021-12-23 DIAGNOSIS — M25512 Pain in left shoulder: Secondary | ICD-10-CM

## 2021-12-23 DIAGNOSIS — M25551 Pain in right hip: Secondary | ICD-10-CM

## 2021-12-23 DIAGNOSIS — M6281 Muscle weakness (generalized): Secondary | ICD-10-CM

## 2021-12-23 NOTE — Therapy (Signed)
OUTPATIENT PHYSICAL THERAPY TREATMENT NOTE   Patient Name: Dana Small MRN: [id number] DOB:June 26, 1945, 76 y.o., female Today's Date: 12/23/2021   END OF SESSION:   PT End of Session - 12/23/21 1214     Visit Number 8    Number of Visits 12    Date for PT Re-Evaluation 01/09/22    Authorization Type medicare    Progress Note Due on Visit 10    PT Start Time 1217    PT Stop Time 1256    PT Time Calculation (min) 39 min    Activity Tolerance Patient tolerated treatment well    Behavior During Therapy [facility] for tasks assessed/performed                Past Medical History:  Diagnosis Date   Arthritis    Hypertension    Past Surgical History:  Procedure Laterality Date   COLONOSCOPY  06/11/2013   [facility]   TOE SURGERY     tooth implant     Patient Active Problem List   Diagnosis Date Noted   Vestibular hypofunction, bilateral 12/13/2021   Hyperlipidemia with target LDL less than 100 06/14/2020   Sensorineural hearing loss (SNHL) of both ears 05/26/2020   BPPV (benign paroxysmal positional vertigo), unspecified laterality 05/26/2020   Pre-diabetes 10/14/2019   Essential hypertension 06/18/2014   Osteoarthritis 06/18/2014   Routine general medical examination at a health care facility 06/18/2014      PCP: Pricilla Holm, MD   REFERRING PROVIDER: Gregor Hams, MD     REFERRING DIAG: [phone] (ICD-10-CM) - Chronic right hip pain M25.512,G89.29 (ICD-10-CM) - Chronic left shoulder pain   THERAPY DIAG:  Pain in right hip   Acute pain of left shoulder   Muscle weakness (generalized)   Rationale for Evaluation and Treatment Rehabilitation   ONSET DATE: 3 months for left shoulder   SUBJECTIVE:    SUBJECTIVE STATEMENT: 12/23/2021 Pt thinks shoulder is getting better.    Eval: States that she has had arthritis  and she has arthritis in her right toe. Pain in her right hip hurts with tight pains or sitting funning on the  couch. States she has pain with walking and she would like to walk better. States she is going to [place] for 2 months and she wants to be able to walk easier.       PERTINENT HISTORY: HTN, history of right hip pain and right "dancers" toe   PAIN:  Are you having pain? Yes: NPRS scale: 4/10 Pain location: left upper arm Pain description: achy Aggravating factors: water aerobics, bringing arm out to the side  Relieving factors: advil   PRECAUTIONS: None   WEIGHT BEARING RESTRICTIONS No   FALLS:  Has patient fallen in last 6 months? No       OCCUPATION: not currently working   PLOF: Independent   PATIENT GOALS hip - to be able to walk easier, to have less pain in her left shoulder     OBJECTIVE:    DIAGNOSTIC FINDINGS: 11/23/21 Left shoulder xray IMPRESSION: Minimal cortical sclerosis is identified in the lateral superior aspect of the left humerus. Otherwise no significant degenerative joint changes noted.     COGNITION:           Overall cognitive status: Within functional limits for tasks assessed                          SENSATION: [facility]  POSTURE: rounded shoulders, forward head, and flexed trunk    PALPATION: Tenderness to palpation along left biceps and triceps, hypo mobility with left shoulder in posterior direction              Cervical A/PROM:    11/29/2021      Flexion  0% limited     Extension 75% limited      R ROT  50% limited     L ROT  50% limited*     R SB 50% limited     L SB 50% limited                           * tight on ipsilateral side                  (Blank rows = not tested)                LE Measurements will assess in future sessions       Lower Extremity Right 11/29/2021 Left 11/29/2021    A/PROM MMT A/PROM MMT  Hip Flexion          Hip Extension          Hip Abduction          Hip Adduction          Hip Internal rotation          Hip External rotation          Knee Flexion          Knee Extension          Ankle  Dorsiflexion          Ankle Plantarflexion          Ankle Inversion          Ankle Eversion           (Blank rows = not tested)            * pain              UE Measurements       Upper Extremity Right 11/29/2021 Left 11/29/2021    A/PROM MMT A/PROM MMT  Shoulder Flexion 150/170 4 100* 3+  Shoulder Extension          Shoulder Abduction WFL (scaption) 4+ 105* 3+  Shoulder Adduction          Shoulder Internal Rotation Reaches to T12 SP /80 4+ Reaches to T12 SP /45 4+  Shoulder External Rotation Reaches to T1SP /95 4 Reaches to top of left shoulder /45 4-  Elbow Flexion          Elbow Extension          Wrist Flexion          Wrist Extension          Wrist Supination          Wrist Pronation          Wrist Ulnar Deviation          Wrist Radial Deviation          Grip Strength NA   NA                          (Blank rows = not tested)                       *  pain Patient left handed.       TODAY'S TREATMENT:  12/23/2021  Therapeutic Exercise: Supine:  shoulder flexion AAROM/cane x 15; ER at 45 deg with cane x 15;     ER butterfly x 15;  S/L : ER on L, 2x10 2 lb ;  Standing:   doorway4x 15" holds;  AROM/flexion x 10  Seated: pulley/flexion and abd x 2 min ea,  Neuromuscular Re-education: Manual Therapy:  PROM in all directions , LAD, GHJ mobs post and inf.    Previous:  Therapeutic Exercise: Supine:  shoulder flexion AAROM/cane x 15; ER at 90 /90 ROM x 15; ER butterfly x 15;  S/L : ER on R, 2x10 2 lb ;  Standing: Shoulder flexion at wall 3 minutes, 1 hans;   doorway4x 15" holds; Rows GTB x 20;  Bil Shoulder ER YTB x 20;  Seated: pulley/flexion x 2 min, abd x 1 min;         PATIENT EDUCATION:  Education details: on HEP Person educated: Patient Education method: Explanation, Media planner, and Handouts Education comprehension: verbalized understanding     HOME EXERCISE PROGRAM: [id number]   ASSESSMENT:   CLINICAL IMPRESSION: 12/23/2021 Pt with mild soreness  today with activities. She is showing improvements in ROM but still limited in flexion and overhead motions. Pt requires max cuing with ther ex, to perform correctly, continued education on form for HEP. Pt to benefit from continued ROM and strengthening.   Eval: Patient complains of left shoulder pain and right hip pain. Session focused on assessment of left shoulder per patient request, will assess right hip in future. Patient with limitations in ROM and strength that are limiting overall function. Education patient in current presentation and patient would greatly benefit from skilled PT to improve overall function and QOL.     OBJECTIVE IMPAIRMENTS decreased activity tolerance, difficulty walking, decreased ROM, decreased strength, impaired UE functional use, postural dysfunction, and pain.    ACTIVITY LIMITATIONS lifting, reach over head, hygiene/grooming, and locomotion level   PARTICIPATION LIMITATIONS: driving and community activity   PERSONAL FACTORS Age are also affecting patient's functional outcome.    REHAB POTENTIAL: Good   CLINICAL DECISION MAKING: Stable/uncomplicated   EVALUATION COMPLEXITY: Low   GOALS: Goals reviewed with patient?  yes   SHORT TERM GOALS:   Patient will be independent in self management strategies to improve quality of life and functional outcomes. Baseline: new program Target date: 12/20/2021 Goal status: INITIAL   2.  Patient will report at least 50% improvement in overall symptoms and/or function to demonstrate improved functional mobility Baseline: 0% Target date: 12/20/2021 Goal status: INITIAL   3.  Patient will be able to demonstrate at least 120 degrees of active left shoulder flexion and abd to improve OH reaching  Baseline: see above Target date: 12/20/2021 Goal status: INITIAL        LONG TERM GOALS:   Patient will report at least 75% improvement in overall symptoms and/or function to demonstrate improved functional  mobility Baseline: 0% Target date: 12/27/2021 Goal status: INITIAL   2.  Patient will be able close car door without pain to improve ability to get in and out of the car.  Baseline: painful Target date: 12/27/2021 Goal status: INITIAL   3.  Patient will be able to walk around neighborhood for at least 30 minutes without pain limiting her to improve ability to walk while visiting her  son in [place]. Baseline: unable Target date: 12/27/2021 Goal status: INITIAL   PLAN:  PT FREQUENCY: 2x/week   PT DURATION: 6 weeks   PLANNED INTERVENTIONS: Therapeutic exercises, Therapeutic activity, Neuromuscular re-education, Balance training, Gait training, Patient/Family education, Self Care, Joint mobilization, Joint manipulation, Vestibular training, Orthotic/Fit training, Aquatic Therapy, Dry Needling, Electrical stimulation, Spinal manipulation, Spinal mobilization, Cryotherapy, Moist heat, Traction, Ultrasound, Ionotophoresis '4mg'$ /ml Dexamethasone, and Manual therapy   PLAN FOR NEXT SESSION: shoulder ROM and strengthening. assess right hip when appropriate, develop HEP for shoulder and hip      Lyndee Hensen, PT, DPT 12:57 PM  12/23/21

## 2021-12-26 ENCOUNTER — Ambulatory Visit (INDEPENDENT_AMBULATORY_CARE_PROVIDER_SITE_OTHER): Payer: Medicare Other | Admitting: Physical Therapy

## 2021-12-26 ENCOUNTER — Encounter: Payer: Self-pay | Admitting: Physical Therapy

## 2021-12-26 DIAGNOSIS — M25551 Pain in right hip: Secondary | ICD-10-CM | POA: Diagnosis not present

## 2021-12-26 DIAGNOSIS — M6281 Muscle weakness (generalized): Secondary | ICD-10-CM

## 2021-12-26 DIAGNOSIS — M25512 Pain in left shoulder: Secondary | ICD-10-CM

## 2021-12-26 NOTE — Therapy (Signed)
OUTPATIENT PHYSICAL THERAPY TREATMENT NOTE   Patient Name: TRANESHA LESSNER MRN: [id number] DOB:[birth year], 76 y.o., female Today's Date: 12/26/2021   END OF SESSION:   PT End of Session - 12/26/21 1307     Visit Number 9    Date for PT Re-Evaluation 01/09/22    Authorization Type medicare    Progress Note Due on Visit 10    PT Start Time 1216    PT Stop Time 1255    PT Time Calculation (min) 39 min                 Past Medical History:  Diagnosis Date   Arthritis    Hypertension    Past Surgical History:  Procedure Laterality Date   COLONOSCOPY  06/11/2013   [facility]   TOE SURGERY     tooth implant     Patient Active Problem List   Diagnosis Date Noted   Vestibular hypofunction, bilateral 12/13/2021   Hyperlipidemia with target LDL less than 100 06/14/2020   Sensorineural hearing loss (SNHL) of both ears 05/26/2020   BPPV (benign paroxysmal positional vertigo), unspecified laterality 05/26/2020   Pre-diabetes 10/14/2019   Essential hypertension 06/18/2014   Osteoarthritis 06/18/2014   Routine general medical examination at a health care facility 06/18/2014      PCP: Pricilla Holm, MD   REFERRING PROVIDER: Gregor Hams, MD     REFERRING DIAG: [phone] (ICD-10-CM) - Chronic right hip pain M25.512,G89.29 (ICD-10-CM) - Chronic left shoulder pain   THERAPY DIAG:  Pain in right hip   Acute pain of left shoulder   Muscle weakness (generalized)   Rationale for Evaluation and Treatment Rehabilitation   ONSET DATE: 3 months for left shoulder   SUBJECTIVE:    SUBJECTIVE STATEMENT: 12/26/2021 Pt states that she was not active with her HEP over the weekend due to running after her grandkids. She does report minimal pain today at water aerobics.    Eval: States that she has had arthritis  and she has arthritis in her right toe. Pain in her right hip hurts with tight pains or sitting funning on the couch. States she has pain  with walking and she would like to walk better. States she is going to [place] for 2 months and she wants to be able to walk easier.       PERTINENT HISTORY: HTN, history of right hip pain and right "dancers" toe   PAIN:  Are you having pain? Yes: NPRS scale: 4/10 Pain location: left upper arm Pain description: achy Aggravating factors: water aerobics, bringing arm out to the side  Relieving factors: advil   PRECAUTIONS: None   WEIGHT BEARING RESTRICTIONS No   FALLS:  Has patient fallen in last 6 months? No       OCCUPATION: not currently working   PLOF: Independent   PATIENT GOALS hip - to be able to walk easier, to have less pain in her left shoulder     OBJECTIVE:    DIAGNOSTIC FINDINGS: 11/23/21 Left shoulder xray IMPRESSION: Minimal cortical sclerosis is identified in the lateral superior aspect of the left humerus. Otherwise no significant degenerative joint changes noted.     COGNITION:           Overall cognitive status: Within functional limits for tasks assessed                          SENSATION: WFL     POSTURE: rounded  shoulders, forward head, and flexed trunk    PALPATION: Tenderness to palpation along left biceps and triceps, hypo mobility with left shoulder in posterior direction              Cervical A/PROM:    11/29/2021      Flexion  0% limited     Extension 75% limited      R ROT  50% limited     L ROT  50% limited*     R SB 50% limited     L SB 50% limited                           * tight on ipsilateral side                  (Blank rows = not tested)                LE Measurements will assess in future sessions       Lower Extremity Right 11/29/2021 Left 11/29/2021    A/PROM MMT A/PROM MMT  Hip Flexion          Hip Extension          Hip Abduction          Hip Adduction          Hip Internal rotation          Hip External rotation          Knee Flexion          Knee Extension          Ankle Dorsiflexion          Ankle  Plantarflexion          Ankle Inversion          Ankle Eversion           (Blank rows = not tested)            * pain              UE Measurements       Upper Extremity Right 11/29/2021 Left 11/29/2021    A/PROM MMT A/PROM MMT  Shoulder Flexion 150/170 4 100* 3+  Shoulder Extension          Shoulder Abduction WFL (scaption) 4+ 105* 3+  Shoulder Adduction          Shoulder Internal Rotation Reaches to T12 SP /80 4+ Reaches to T12 SP /45 4+  Shoulder External Rotation Reaches to T1SP /95 4 Reaches to top of left shoulder /45 4-  Elbow Flexion          Elbow Extension          Wrist Flexion          Wrist Extension          Wrist Supination          Wrist Pronation          Wrist Ulnar Deviation          Wrist Radial Deviation          Grip Strength NA   NA                          (Blank rows = not tested)                       * pain  Patient left handed.       TODAY'S TREATMENT:  12/26/2021  Therapeutic Exercise: Supine:  shoulder flexion AAROM/cane x 15; ER at 45 deg with cane x 15;  ER butterfly x 15;  S/L : ER on L, 2x10 2 lb ;  Standing:  UBE 2 min fwd/ bkwd, doorway4x 15" holds;  AROM/flexion x 10  Seated:  Neuromuscular Re-education: Manual Therapy:  PROM in all directions , LAD, GHJ mobs post and inf.   Therapeutic Exercise: Supine:  shoulder flexion AAROM/cane x 15; ER at 45 deg with cane x 15;     ER butterfly x 15;  S/L : ER on L, 2x10 2 lb ;  Standing:   doorway4x 15" holds;  AROM/flexion x 10  Seated: pulley/flexion and abd x 2 min ea,  Neuromuscular Re-education: Manual Therapy:  PROM in all directions , LAD, GHJ mobs post and inf.    Previous:  Therapeutic Exercise: Supine:  shoulder flexion AAROM/cane x 15; ER at 90 /90 ROM x 15; ER butterfly x 15;  S/L : ER on R, 2x10 2 lb ;  Standing: Shoulder flexion at wall 3 minutes, 1 hans;   doorway4x 15" holds; Rows GTB x 20;  Bil Shoulder ER YTB x 20;  Seated: pulley/flexion x 2 min, abd x 1 min;          PATIENT EDUCATION:  Education details: on HEP Person educated: Patient Education method: Explanation, Media planner, and Handouts Education comprehension: verbalized understanding     HOME EXERCISE PROGRAM: [id number]   ASSESSMENT:   CLINICAL IMPRESSION: 12/26/2021 Pt with mild soreness today with activities. She continues to show improvements in ROM with functional ROM into flexion. Pt requires max cuing with ther ex, to perform correctly, continued education on form for HEP. Pt to benefit from continued ROM and strengthening. Pt will continue to benefit from skilled PT to address continued deficits.    Eval: Patient complains of left shoulder pain and right hip pain. Session focused on assessment of left shoulder per patient request, will assess right hip in future. Patient with limitations in ROM and strength that are limiting overall function. Education patient in current presentation and patient would greatly benefit from skilled PT to improve overall function and QOL.     OBJECTIVE IMPAIRMENTS decreased activity tolerance, difficulty walking, decreased ROM, decreased strength, impaired UE functional use, postural dysfunction, and pain.    ACTIVITY LIMITATIONS lifting, reach over head, hygiene/grooming, and locomotion level   PARTICIPATION LIMITATIONS: driving and community activity   PERSONAL FACTORS Age are also affecting patient's functional outcome.    REHAB POTENTIAL: Good   CLINICAL DECISION MAKING: Stable/uncomplicated   EVALUATION COMPLEXITY: Low   GOALS: Goals reviewed with patient?  yes   SHORT TERM GOALS:   Patient will be independent in self management strategies to improve quality of life and functional outcomes. Baseline: new program Target date: 12/20/2021 Goal status: INITIAL   2.  Patient will report at least 50% improvement in overall symptoms and/or function to demonstrate improved functional mobility Baseline: 0% Target date: 12/20/2021 Goal  status: INITIAL   3.  Patient will be able to demonstrate at least 120 degrees of active left shoulder flexion and abd to improve OH reaching  Baseline: see above Target date: 12/20/2021 Goal status: INITIAL        LONG TERM GOALS:   Patient will report at least 75% improvement in overall symptoms and/or function to demonstrate improved functional mobility Baseline: 0% Target date: 12/27/2021 Goal status:  INITIAL   2.  Patient will be able close car door without pain to improve ability to get in and out of the car.  Baseline: painful Target date: 12/27/2021 Goal status: INITIAL   3.  Patient will be able to walk around neighborhood for at least 30 minutes without pain limiting her to improve ability to walk while visiting her  son in [place]. Baseline: unable Target date: 12/27/2021 Goal status: INITIAL   PLAN: PT FREQUENCY: 2x/week   PT DURATION: 6 weeks   PLANNED INTERVENTIONS: Therapeutic exercises, Therapeutic activity, Neuromuscular re-education, Balance training, Gait training, Patient/Family education, Self Care, Joint mobilization, Joint manipulation, Vestibular training, Orthotic/Fit training, Aquatic Therapy, Dry Needling, Electrical stimulation, Spinal manipulation, Spinal mobilization, Cryotherapy, Moist heat, Traction, Ultrasound, Ionotophoresis '4mg'$ /ml Dexamethasone, and Manual therapy   PLAN FOR NEXT SESSION: shoulder ROM and strengthening. assess right hip when appropriate, develop HEP for shoulder and hip      Rudi Heap PT, DPT 12/26/21  1:08 PM

## 2022-01-04 ENCOUNTER — Ambulatory Visit (INDEPENDENT_AMBULATORY_CARE_PROVIDER_SITE_OTHER): Payer: Medicare Other | Admitting: Physical Therapy

## 2022-01-04 ENCOUNTER — Encounter: Payer: Self-pay | Admitting: Physical Therapy

## 2022-01-04 DIAGNOSIS — M6281 Muscle weakness (generalized): Secondary | ICD-10-CM | POA: Diagnosis not present

## 2022-01-04 DIAGNOSIS — M25512 Pain in left shoulder: Secondary | ICD-10-CM

## 2022-01-04 DIAGNOSIS — M25551 Pain in right hip: Secondary | ICD-10-CM | POA: Diagnosis not present

## 2022-01-04 NOTE — Therapy (Signed)
OUTPATIENT PHYSICAL THERAPY TREATMENT NOTE PHYSICAL THERAPY DISCHARGE SUMMARY  Visits from Start of Care: 10  Current functional level related to goals / functional outcomes: See below    Remaining deficits: Strength/ROM   Education / Equipment: See below   Patient agrees to discharge. Patient goals were partially met. Patient is being discharged due to being pleased with the current functional level.   Patient Name: Dana Small MRN: [id number] DOB:05-Dec-1945, 76 y.o., female Today's Date: 01/04/2022   END OF SESSION:   PT End of Session - 01/04/22 1347     Visit Number 10    Date for PT Re-Evaluation 01/09/22    Authorization Type medicare    Progress Note Due on Visit 10    PT Start Time 5681    PT Stop Time 1428    PT Time Calculation (min) 39 min                 Past Medical History:  Diagnosis Date   Arthritis    Hypertension    Past Surgical History:  Procedure Laterality Date   COLONOSCOPY  06/11/2013   [facility]   TOE SURGERY     tooth implant     Patient Active Problem List   Diagnosis Date Noted   Vestibular hypofunction, bilateral 12/13/2021   Hyperlipidemia with target LDL less than 100 06/14/2020   Sensorineural hearing loss (SNHL) of both ears 05/26/2020   BPPV (benign paroxysmal positional vertigo), unspecified laterality 05/26/2020   Pre-diabetes 10/14/2019   Essential hypertension 06/18/2014   Osteoarthritis 06/18/2014   Routine general medical examination at a health care facility 06/18/2014      PCP: Pricilla Holm, MD   REFERRING PROVIDER: Gregor Hams, MD     REFERRING DIAG: [phone] (ICD-10-CM) - Chronic right hip pain M25.512,G89.29 (ICD-10-CM) - Chronic left shoulder pain   THERAPY DIAG:  Pain in right hip   Acute pain of left shoulder   Muscle weakness (generalized)   Rationale for Evaluation and Treatment Rehabilitation   ONSET DATE: 3 months for left shoulder   SUBJECTIVE:     SUBJECTIVE STATEMENT: 01/04/2022 States she is doing better, at times she is sore. States she has been busy so she hasn't done any exercises this week. States she was gardening the other day. Patient reports 70% better   Eval: States that she has had arthritis  and she has arthritis in her right toe. Pain in her right hip hurts with tight pains or sitting funning on the couch. States she has pain with walking and she would like to walk better. States she is going to [place] for 2 months and she wants to be able to walk easier.       PERTINENT HISTORY: HTN, history of right hip pain and right "dancers" toe   PAIN:  Are you having pain? Yes: NPRS scale: 3/10 Pain location: left upper arm Pain description: achy Aggravating factors: water aerobics, bringing arm out to the side  Relieving factors: advil   PRECAUTIONS: None   WEIGHT BEARING RESTRICTIONS No/  / FALLS:  Has patient fallen in last 6 months? No       OCCUPATION: not currently working   PLOF: Independent   PATIENT GOALS hip - to be able to walk easier, to have less pain in her left shoulder     OBJECTIVE:    DIAGNOSTIC FINDINGS: 11/23/21 Left shoulder xray IMPRESSION: Minimal cortical sclerosis is identified in the lateral superior aspect of the left  humerus. Otherwise no significant degenerative joint changes noted.     COGNITION:           Overall cognitive status: Within functional limits for tasks assessed                          SENSATION: WFL     POSTURE: rounded shoulders, forward head, and flexed trunk    PALPATION: Tenderness to palpation along left biceps and triceps, hypo mobility with left shoulder in posterior direction              Cervical A/PROM:    11/29/2021  01/04/22    Flexion  0% limited  0% limited   Extension 75% limited  50% limited    R ROT  50% limited  50% limited   L ROT  50% limited*  50% limited   R SB 50% limited  50% limited   L SB 50% limited   50% limited                         * tight on ipsilateral side                  (Blank rows = not tested)                LE Measurements will assess in future sessions       Lower Extremity Right 11/29/2021 Left 11/29/2021    A/PROM MMT A/PROM MMT  Hip Flexion          Hip Extension          Hip Abduction          Hip Adduction          Hip Internal rotation          Hip External rotation          Knee Flexion          Knee Extension          Ankle Dorsiflexion          Ankle Plantarflexion          Ankle Inversion          Ankle Eversion           (Blank rows = not tested)            * pain              UE Measurements       Upper Extremity Right 01/04/2022 Left 01/04/2022    A/PROM MMT A/PROM MMT  Shoulder Flexion 150/170 4 122*/128* 4  Shoulder Extension          Shoulder Abduction WFL (scaption) 4+ WFL (scaption) * 3+  Shoulder Adduction          Shoulder Internal Rotation Reaches to T12 SP /80 4+ Reaches to T12 SP /70 4+  Shoulder External Rotation Reaches to T1SP /95 4 Reaches to C7 SP /60 4  Elbow Flexion          Elbow Extension          Wrist Flexion          Wrist Extension          Wrist Supination          Wrist Pronation          Wrist Ulnar Deviation          Wrist  Radial Deviation          Grip Strength NA   NA                          (Blank rows = not tested)                       * pain Patient left handed.       TODAY'S TREATMENT:  01/04/2022  Therapeutic Exercise:   Standing:  pulleys 3 minutes, shoulder ER with yellow band x30 B        PATIENT EDUCATION:  Education details: on HEP, progress and POC, return to walking program Person educated: Patient Education method: Explanation, Demonstration, and Handouts Education comprehension: verbalized understanding     HOME EXERCISE PROGRAM: [id number]   ASSESSMENT:   CLINICAL IMPRESSION: 01/04/2022 Patient doing well and has made great progress towards her goals. Reviewed HEP and educated patient on  importance of adherence to HEP. Patient to leave country in a few weeks and showed patient how to access exercises while abroad with website/app. Patient to discharge from PT to HEP secondary to progress made.   Eval: Patient complains of left shoulder pain and right hip pain. Session focused on assessment of left shoulder per patient request, will assess right hip in future. Patient with limitations in ROM and strength that are limiting overall function. Education patient in current presentation and patient would greatly benefit from skilled PT to improve overall function and QOL.     OBJECTIVE IMPAIRMENTS decreased activity tolerance, difficulty walking, decreased ROM, decreased strength, impaired UE functional use, postural dysfunction, and pain.    ACTIVITY LIMITATIONS lifting, reach over head, hygiene/grooming, and locomotion level   PARTICIPATION LIMITATIONS: driving and community activity   PERSONAL FACTORS Age are also affecting patient's functional outcome.    REHAB POTENTIAL: Good   CLINICAL DECISION MAKING: Stable/uncomplicated   EVALUATION COMPLEXITY: Low   GOALS: Goals reviewed with patient?  yes   SHORT TERM GOALS:   Patient will be independent in self management strategies to improve quality of life and functional outcomes. Baseline: new program Target date: 12/20/2021 Goal status: Progressing   2.  Patient will report at least 50% improvement in overall symptoms and/or function to demonstrate improved functional mobility Baseline: 0% Target date: 12/20/2021 Goal status: MET   3.  Patient will be able to demonstrate at least 120 degrees of active left shoulder flexion and abd to improve OH reaching  Baseline: see above Target date: 12/20/2021 Goal status: MET        LONG TERM GOALS:   Patient will report at least 75% improvement in overall symptoms and/or function to demonstrate improved functional mobility Baseline: 0% Target date: 12/27/2021 Goal status:  PROGRESSING   2.  Patient will be able close car door without pain to improve ability to get in and out of the car.  Baseline: painful Target date: 12/27/2021 Goal status: MET   3.  Patient will be able to walk around neighborhood for at least 30 minutes without pain limiting her to improve ability to walk while visiting her  son in [place]. Baseline: unable Target date: 12/27/2021 Goal status: PROGRESSING    PLAN: PT FREQUENCY: 2x/week   PT DURATION: 6 weeks   PLANNED INTERVENTIONS: Therapeutic exercises, Therapeutic activity, Neuromuscular re-education, Balance training, Gait training, Patient/Family education, Self Care, Joint mobilization, Joint manipulation, Vestibular training, Orthotic/Fit training, Aquatic Therapy, Dry Needling, Electrical stimulation, Spinal manipulation,  Spinal mobilization, Cryotherapy, Moist heat, Traction, Ultrasound, Ionotophoresis 64m/ml Dexamethasone, and Manual therapy   PLAN FOR NEXT SESSION: DC to HEP     2:37 PM, 01/04/22 MJerene Pitch DPT [facility]

## 2022-01-09 ENCOUNTER — Ambulatory Visit (INDEPENDENT_AMBULATORY_CARE_PROVIDER_SITE_OTHER): Payer: Medicare Other | Admitting: Family Medicine

## 2022-01-09 VITALS — BP 148/78 | HR 64 | Ht 64.0 in | Wt 163.0 lb

## 2022-01-09 DIAGNOSIS — G8929 Other chronic pain: Secondary | ICD-10-CM

## 2022-01-09 DIAGNOSIS — M25512 Pain in left shoulder: Secondary | ICD-10-CM

## 2022-01-09 DIAGNOSIS — M25551 Pain in right hip: Secondary | ICD-10-CM | POA: Diagnosis not present

## 2022-01-09 MED ORDER — TRAMADOL HCL 50 MG PO TABS: 50.0000 mg | ORAL_TABLET | Freq: Three times a day (TID) | ORAL | 0 refills | Status: DC | PRN

## 2022-01-09 MED ORDER — PREDNISONE 50 MG PO TABS: ORAL_TABLET | ORAL | 0 refills | Status: DC

## 2022-01-09 NOTE — Progress Notes (Signed)
   I, Peterson Lombard, LAT, ATC acting as a scribe for Lynne Leader, MD.  Dana Small is a 76 y.o. female who presents to [employer] [place] at [facility] today for f/u L shoulder and R hip pain. Pt was last seen by Dr. Georgina Snell on 11/23/21 and was advised to avoid high-dose oral NSAIDs and was referred to PT, completing 10 visits, and being d/c on 9/20. Today, pt reports L shoulder is somewhat better w/ increase in her AROM. Pt is going on a trip in Oct to [place] and will be there until December. Pt notes PT has not been treating her R hip. Pt has been able to walk and do water aerobics.   Dx imaging: 11/23/21 L should & R hip XR  10/14/19 L-spine XR  Pertinent review of systems: No fevers or chills  Relevant historical information: BPPV.   Exam:  BP (!) 148/78   Pulse 64   Ht '5\' 4"'$  (1.626 m)   Wt 163 lb (73.9 kg)   SpO2 97%   BMI 27.98 kg/m  General: Well Developed, well nourished, and in no acute distress.   MSK: Left shoulder normal-appearing normal motion intact strength. Right hip: Normal-appearing nontender greater trochanter.   \ Assessment and Plan: 76 y.o. female with left shoulder pain thought to be due to impingement and rotator cuff tendinitis.  Right lateral hip pain thought primarily due to hip abductor tendinopathy.  Both problems improving with home exercise program and physical therapy.  Plan to continue home exercise program well and hungry for the next 2 months. I have prescribed prednisone and limited tramadol as emergency backup plan while traveling internationally.  Certainly she can communicate with me via MyChart if needed.  Recheck if needed upon return [place].   PDMP reviewed during this encounter. No orders of the defined types were placed in this encounter.  Meds ordered this encounter  Medications   predniSONE (DELTASONE) 50 MG tablet    Sig: Take for 5 days for severe pain while traveling internationally. May repeat if needed     Dispense:  10 tablet    Refill:  0   traMADol (ULTRAM) 50 MG tablet    Sig: Take 1 tablet (50 mg total) by mouth every 8 (eight) hours as needed for severe pain.    Dispense:  15 tablet    Refill:  0     Discussed warning signs or symptoms. Please see discharge instructions. Patient expresses understanding.   The above documentation has been reviewed and is accurate and complete Lynne Leader, M.D.  Total encounter time 30 minutes including face-to-face time with the patient and, reviewing past medical record, and charting on the date of service.

## 2022-01-09 NOTE — Patient Instructions (Signed)
Thank you for coming in today.   Take the prednisone for 5 days if you have a lot of pain.   Take the tramadol for severe pain if you needed.   Enjoy your trip.   Continue the exercise. Recheck if needed.   We can communicate with mychart if needed even when you are in [place].

## 2022-01-10 ENCOUNTER — Ambulatory Visit: Payer: Medicare Other

## 2022-01-24 ENCOUNTER — Ambulatory Visit (INDEPENDENT_AMBULATORY_CARE_PROVIDER_SITE_OTHER): Payer: Medicare Other | Admitting: Emergency Medicine

## 2022-01-24 ENCOUNTER — Encounter: Payer: Self-pay | Admitting: Emergency Medicine

## 2022-01-24 VITALS — BP 132/78 | HR 64 | Temp 98.0°F | Ht 64.0 in | Wt 162.1 lb

## 2022-01-24 DIAGNOSIS — B359 Dermatophytosis, unspecified: Secondary | ICD-10-CM

## 2022-01-24 MED ORDER — CLOTRIMAZOLE-BETAMETHASONE 1-0.05 % EX CREA: 1.0000 | TOPICAL_CREAM | Freq: Two times a day (BID) | CUTANEOUS | 1 refills | Status: DC

## 2022-01-24 NOTE — Progress Notes (Signed)
Dana Small 76 y.o.   Chief Complaint  Patient presents with   Tinea    Patient thinks she has a ring worm on her right leg, and both arm     HISTORY OF PRESENT ILLNESS: Acute problem visit today.  Patient of Dr. Pricilla Holm. This is a 76 y.o. female complaining of possible ringworm skin rash. No other complaint or medical concerns today.  HPI   Prior to Admission medications   Medication Sig Start Date End Date Taking? Authorizing Provider  clotrimazole-betamethasone (LOTRISONE) cream Apply 1 Application topically 2 (two) times daily. 01/24/22  Yes Rehan Holness, Ines Bloomer, MD  irbesartan (AVAPRO) 150 MG tablet Take 1 tablet (150 mg total) by mouth daily. 12/13/21  Yes Hoyt Koch, MD  pravastatin (PRAVACHOL) 20 MG tablet TAKE 1 TABLET(20 MG) BY MOUTH DAILY 12/13/21  Yes Hoyt Koch, MD  predniSONE (DELTASONE) 50 MG tablet Take for 5 days for severe pain while traveling internationally. May repeat if needed 01/09/22  Yes Gregor Hams, MD  traMADol (ULTRAM) 50 MG tablet Take 1 tablet (50 mg total) by mouth every 8 (eight) hours as needed for severe pain. 01/09/22  Yes Gregor Hams, MD    No Active Allergies  Patient Active Problem List   Diagnosis Date Noted   Vestibular hypofunction, bilateral 12/13/2021   Hyperlipidemia with target LDL less than 100 06/14/2020   Sensorineural hearing loss (SNHL) of both ears 05/26/2020   BPPV (benign paroxysmal positional vertigo), unspecified laterality 05/26/2020   Pre-diabetes 10/14/2019   Essential hypertension 06/18/2014   Osteoarthritis 06/18/2014   Routine general medical examination at a health care facility 06/18/2014    Past Medical History:  Diagnosis Date   Arthritis    Hypertension     Past Surgical History:  Procedure Laterality Date   COLONOSCOPY  06/11/2013   [facility]   TOE SURGERY     tooth implant      Social History   Socioeconomic History   Marital status: Married     Spouse name: Not on file   Number of children: Not on file   Years of education: Not on file   Highest education level: Not on file  Occupational History   Not on file  Tobacco Use   Smoking status: Never   Smokeless tobacco: Never  Vaping Use   Vaping Use: Never used  Substance and Sexual Activity   Alcohol use: Yes   Drug use: Never   Sexual activity: Not on file  Other Topics Concern   Not on file  Social History Narrative   Not on file   Social Determinants of Health   Financial Resource Strain: Low Risk  (06/07/2021)   Overall Financial Resource Strain (CARDIA)    Difficulty of Paying Living Expenses: Not hard at all  Food Insecurity: No Food Insecurity (06/07/2021)   Hunger Vital Sign    Worried About Running Out of Food in the Last Year: Never true    [place] in the Last Year: Never true  Transportation Needs: No Transportation Needs (06/07/2021)   PRAPARE - Hydrologist (Medical): No    Lack of Transportation (Non-Medical): No  Physical Activity: Sufficiently Active (06/07/2021)   Exercise Vital Sign    Days of Exercise per Week: 5 days    Minutes of Exercise per Session: 30 min  Stress: No Stress Concern Present (06/07/2021)   [place]  Questionnaire    Feeling of Stress : Not at all  Social Connections: Socially Integrated (06/07/2021)   Social Connection and Isolation Panel [NHANES]    Frequency of Communication with Friends and Family: More than three times a week    Frequency of Social Gatherings with Friends and Family: More than three times a week    Attends Religious Services: More than 4 times per year    Active Member of [employer] or Organizations: Yes    Attends Music therapist: More than 4 times per year    Marital Status: Married  Human resources officer Violence: Not At Risk (06/07/2021)   Humiliation, Afraid, Rape, and Kick questionnaire    Fear of Current  or Ex-Partner: No    Emotionally Abused: No    Physically Abused: No    Sexually Abused: No    Family History  Problem Relation Age of Onset   Heart disease Other        Parents   Hypertension Other        Parents   Diverticulitis Father    Breast cancer Neg Hx    Colon cancer Neg Hx    Esophageal cancer Neg Hx    Cancer Neg Hx    Stomach cancer Neg Hx    Rectal cancer Neg Hx      Review of Systems  Constitutional: Negative.  Negative for chills and fever.  HENT: Negative.  Negative for congestion and sore throat.   Respiratory: Negative.  Negative for cough and shortness of breath.   Cardiovascular: Negative.  Negative for chest pain and palpitations.  Gastrointestinal:  Negative for abdominal pain, nausea and vomiting.  Skin:  Positive for rash.  Neurological:  Negative for dizziness and headaches.  All other systems reviewed and are negative.  Today's Vitals   01/24/22 1520  BP: 132/78  Pulse: 64  Temp: 98 F (36.7 C)  TempSrc: Oral  SpO2: 95%  Weight: 162 lb 2 oz (73.5 kg)  Height: '5\' 4"'$  (1.626 m)   Body mass index is 27.83 kg/m.   Physical Exam Vitals reviewed.  Constitutional:      Appearance: Normal appearance.  HENT:     Head: Normocephalic.  Eyes:     Extraocular Movements: Extraocular movements intact.  Cardiovascular:     Rate and Rhythm: Normal rate.  Pulmonary:     Effort: Pulmonary effort is normal.  Skin:    General: Skin is warm and dry.     Capillary Refill: Capillary refill takes less than 2 seconds.     Comments: Several ringworm lesions in both wrists, left forehead, and right lower leg  Neurological:     General: No focal deficit present.     Mental Status: She is alert and oriented to person, place, and time.  Psychiatric:        Mood and Affect: Mood normal.        Behavior: Behavior normal.      ASSESSMENT & PLAN: Problem List Items Addressed This Visit   None Visit Diagnoses     Ringworm    -  Primary   Relevant  Medications   clotrimazole-betamethasone (LOTRISONE) cream      Patient Instructions  Body Ringworm Body ringworm is an infection of the skin that often causes a ring-shaped rash. Body ringworm is also called tinea corporis. Body ringworm can affect any part of your skin. This condition is easily spread from person to person (is very contagious). What are the causes? This  condition is caused by fungi called dermatophytes. The condition develops when these fungi grow out of control on the skin. You can get this condition if you touch a person or animal that has it. You can also get it if you share any items with an infected person or pet. These include: Clothing, bedding, and towels. Brushes or combs. Gym equipment. Any other object that has the fungus on it. What increases the risk? You are more likely to develop this condition if you: Play sports that involve close physical contact, such as wrestling. Sweat a lot. Live in areas that are hot and humid. Use public showers. Have a weakened immune system. What are the signs or symptoms? Symptoms of this condition include: Itchy, raised red spots and bumps. Red scaly patches. A ring-shaped rash. The rash may have: A clear center. Scales or red bumps at its center. Redness near its borders. Dry and scaly skin on or around it. How is this diagnosed? This condition can usually be diagnosed with a skin exam. A skin scraping may be taken from the affected area and examined under a microscope to see if the fungus is present. How is this treated? This condition may be treated with: An antifungal cream or ointment. An antifungal shampoo. Antifungal medicines. These may be prescribed if your ringworm: Is severe. Keeps coming back. Lasts a long time. Follow these instructions at home: Take over-the-counter and prescription medicines only as told by your health care provider. If you were given an antifungal cream or ointment: Use it as  told by your health care provider. Wash the infected area and dry it completely before applying the cream or ointment. If you were given an antifungal shampoo: Use it as told by your health care provider. Leave the shampoo on your body for 3-5 minutes before rinsing. While you have a rash: Wear loose clothing to stop clothes from rubbing and irritating it. Wash or change your bed sheets every night. Disinfect or throw out items that may be infected. Wash clothes and bed sheets in hot water. Wash your hands often with soap and water. If soap and water are not available, use hand sanitizer. If your pet has the same infection, take your pet to see a veterinarian for treatment. How is this prevented? Take a bath or shower every day and after every time you work out or play sports. Dry your skin completely after bathing. Wear sandals or shoes in public places and showers. Change your clothes every day. Wash athletic clothes after each use. Do not share personal items with others. Avoid touching red patches of skin on other people. Avoid touching pets that have bald spots. If you touch an animal that has a bald spot, wash your hands. Contact a health care provider if: Your rash continues to spread after 7 days of treatment. Your rash is not gone in 4 weeks. The area around your rash gets red, warm, tender, and swollen. Summary Body ringworm is an infection of the skin that often causes a ring-shaped rash. This condition is easily spread from person to person (is very contagious). This condition may be treated with antifungal cream or ointment, antifungal shampoo, or antifungal medicines. Take over-the-counter and prescription medicines only as told by your health care provider. This information is not intended to replace advice given to you by your health care provider. Make sure you discuss any questions you have with your health care provider. Document Revised: 06/15/2021 Document  Reviewed: 01/25/2021 Elsevier Patient Education  [place], MD [facility]

## 2022-01-24 NOTE — Patient Instructions (Signed)
Body Ringworm Body ringworm is an infection of the skin that often causes a ring-shaped rash. Body ringworm is also called tinea corporis. Body ringworm can affect any part of your skin. This condition is easily spread from person to person (is very contagious). What are the causes? This condition is caused by fungi called dermatophytes. The condition develops when these fungi grow out of control on the skin. You can get this condition if you touch a person or animal that has it. You can also get it if you share any items with an infected person or pet. These include: Clothing, bedding, and towels. Brushes or combs. Gym equipment. Any other object that has the fungus on it. What increases the risk? You are more likely to develop this condition if you: Play sports that involve close physical contact, such as wrestling. Sweat a lot. Live in areas that are hot and humid. Use public showers. Have a weakened immune system. What are the signs or symptoms? Symptoms of this condition include: Itchy, raised red spots and bumps. Red scaly patches. A ring-shaped rash. The rash may have: A clear center. Scales or red bumps at its center. Redness near its borders. Dry and scaly skin on or around it. How is this diagnosed? This condition can usually be diagnosed with a skin exam. A skin scraping may be taken from the affected area and examined under a microscope to see if the fungus is present. How is this treated? This condition may be treated with: An antifungal cream or ointment. An antifungal shampoo. Antifungal medicines. These may be prescribed if your ringworm: Is severe. Keeps coming back. Lasts a long time. Follow these instructions at home: Take over-the-counter and prescription medicines only as told by your health care provider. If you were given an antifungal cream or ointment: Use it as told by your health care provider. Wash the infected area and dry it completely before  applying the cream or ointment. If you were given an antifungal shampoo: Use it as told by your health care provider. Leave the shampoo on your body for 3-5 minutes before rinsing. While you have a rash: Wear loose clothing to stop clothes from rubbing and irritating it. Wash or change your bed sheets every night. Disinfect or throw out items that may be infected. Wash clothes and bed sheets in hot water. Wash your hands often with soap and water. If soap and water are not available, use hand sanitizer. If your pet has the same infection, take your pet to see a veterinarian for treatment. How is this prevented? Take a bath or shower every day and after every time you work out or play sports. Dry your skin completely after bathing. Wear sandals or shoes in public places and showers. Change your clothes every day. Wash athletic clothes after each use. Do not share personal items with others. Avoid touching red patches of skin on other people. Avoid touching pets that have bald spots. If you touch an animal that has a bald spot, wash your hands. Contact a health care provider if: Your rash continues to spread after 7 days of treatment. Your rash is not gone in 4 weeks. The area around your rash gets red, warm, tender, and swollen. Summary Body ringworm is an infection of the skin that often causes a ring-shaped rash. This condition is easily spread from person to person (is very contagious). This condition may be treated with antifungal cream or ointment, antifungal shampoo, or antifungal medicines. Take over-the-counter and   prescription medicines only as told by your health care provider. This information is not intended to replace advice given to you by your health care provider. Make sure you discuss any questions you have with your health care provider. Document Revised: 06/15/2021 Document Reviewed: 01/25/2021 Elsevier Patient Education  [facility] [employer].

## 2022-04-18 ENCOUNTER — Other Ambulatory Visit: Payer: Self-pay | Admitting: Internal Medicine

## 2022-04-18 DIAGNOSIS — Z1231 Encounter for screening mammogram for malignant neoplasm of breast: Secondary | ICD-10-CM

## 2022-06-07 ENCOUNTER — Ambulatory Visit
Admission: RE | Admit: 2022-06-07 | Discharge: 2022-06-07 | Disposition: A | Discharge status: Home / Self Care | Payer: Medicare Other | Source: Ambulatory Visit | Attending: Internal Medicine | Admitting: Internal Medicine

## 2022-06-07 DIAGNOSIS — Z1231 Encounter for screening mammogram for malignant neoplasm of breast: Secondary | ICD-10-CM

## 2022-06-08 ENCOUNTER — Ambulatory Visit (INDEPENDENT_AMBULATORY_CARE_PROVIDER_SITE_OTHER): Payer: Medicare Other

## 2022-06-08 VITALS — BP 120/60 | HR 67 | Temp 97.3°F | Ht 64.0 in | Wt 166.2 lb

## 2022-06-08 DIAGNOSIS — Z0000 Encounter for general adult medical examination without abnormal findings: Secondary | ICD-10-CM | POA: Diagnosis not present

## 2022-06-08 NOTE — Patient Instructions (Signed)
Dana Small , Thank you for taking time to come for your Medicare Wellness Visit. I appreciate your ongoing commitment to your health goals. Please review the following plan we discussed and let me know if I can assist you in the future.   These are the goals we discussed:  Goals      patient stated     My goal for 2024 is continue to travel all over the world and enjoy life.        This is a list of the screening recommended for you and due dates:  Health Maintenance  Topic Date Due   Hepatitis C Screening: USPSTF Recommendation to screen - Ages 86-79 yo.  Never done   Pneumonia Vaccine (2 of 2 - PCV) 10/10/2016   COVID-19 Vaccine (6 - 2023-24 season) 03/14/2022   Flu Shot  07/16/2022*   Medicare Annual Wellness Visit  06/09/2023   Colon Cancer Screening  02/17/2024   DTaP/Tdap/Td vaccine (2 - Td or Tdap) 03/20/2026   DEXA scan (bone density measurement)  Completed   Zoster (Shingles) Vaccine  Completed   HPV Vaccine  Aged Out  *Topic was postponed. The date shown is not the original due date.    Advanced directives: Yes; Please bring a copy of your health care power of attorney and living will to the office at your convenience.  Conditions/risks identified: Yes  Next appointment: Follow up in one year for your annual wellness visit.   Preventive Care 41 Years and Older, Female Preventive care refers to lifestyle choices and visits with your health care provider that can promote health and wellness. What does preventive care include? A yearly physical exam. This is also called an annual well check. Dental exams once or twice a year. Routine eye exams. Ask your health care provider how often you should have your eyes checked. Personal lifestyle choices, including: Daily care of your teeth and gums. Regular physical activity. Eating a healthy diet. Avoiding tobacco and drug use. Limiting alcohol use. Practicing safe sex. Taking low-dose aspirin every day. Taking vitamin  and mineral supplements as recommended by your health care provider. What happens during an annual well check? The services and screenings done by your health care provider during your annual well check will depend on your age, overall health, lifestyle risk factors, and family history of disease. Counseling  Your health care provider may ask you questions about your: Alcohol use. Tobacco use. Drug use. Emotional well-being. Home and relationship well-being. Sexual activity. Eating habits. History of falls. Memory and ability to understand (cognition). Work and work Statistician. Reproductive health. Screening  You may have the following tests or measurements: Height, weight, and BMI. Blood pressure. Lipid and cholesterol levels. These may be checked every 5 years, or more frequently if you are over 46 years old. Skin check. Lung cancer screening. You may have this screening every year starting at age 33 if you have a 30-pack-year history of smoking and currently smoke or have quit within the past 15 years. Fecal occult blood test (FOBT) of the stool. You may have this test every year starting at age 93. Flexible sigmoidoscopy or colonoscopy. You may have a sigmoidoscopy every 5 years or a colonoscopy every 10 years starting at age 38. Hepatitis C blood test. Hepatitis B blood test. Sexually transmitted disease (STD) testing. Diabetes screening. This is done by checking your blood sugar (glucose) after you have not eaten for a while (fasting). You may have this done every 1-3 years.  Bone density scan. This is done to screen for osteoporosis. You may have this done starting at age 67. Mammogram. This may be done every 1-2 years. Talk to your health care provider about how often you should have regular mammograms. Talk with your health care provider about your test results, treatment options, and if necessary, the need for more tests. Vaccines  Your health care provider may recommend  certain vaccines, such as: Influenza vaccine. This is recommended every year. Tetanus, diphtheria, and acellular pertussis (Tdap, Td) vaccine. You may need a Td booster every 10 years. Zoster vaccine. You may need this after age 2. Pneumococcal 13-valent conjugate (PCV13) vaccine. One dose is recommended after age 65. Pneumococcal polysaccharide (PPSV23) vaccine. One dose is recommended after age 73. Talk to your health care provider about which screenings and vaccines you need and how often you need them. This information is not intended to replace advice given to you by your health care provider. Make sure you discuss any questions you have with your health care provider. Document Released: 04/30/2015 Document Revised: 12/22/2015 Document Reviewed: 02/02/2015 Elsevier Interactive Patient Education  2017 [place] Prevention in the Home Falls can cause injuries. They can happen to people of all ages. There are many things you can do to make your home safe and to help prevent falls. What can I do on the outside of my home? Regularly fix the edges of walkways and driveways and fix any cracks. Remove anything that might make you trip as you walk through a door, such as a raised step or threshold. Trim any bushes or trees on the path to your home. Use bright outdoor lighting. Clear any walking paths of anything that might make someone trip, such as rocks or tools. Regularly check to see if handrails are loose or broken. Make sure that both sides of any steps have handrails. Any raised decks and porches should have guardrails on the edges. Have any leaves, snow, or ice cleared regularly. Use sand or salt on walking paths during winter. Clean up any spills in your garage right away. This includes oil or grease spills. What can I do in the bathroom? Use night lights. Install grab bars by the toilet and in the tub and shower. Do not use towel bars as grab bars. Use non-skid mats or  decals in the tub or shower. If you need to sit down in the shower, use a plastic, non-slip stool. Keep the floor dry. Clean up any water that spills on the floor as soon as it happens. Remove soap buildup in the tub or shower regularly. Attach bath mats securely with double-sided non-slip rug tape. Do not have throw rugs and other things on the floor that can make you trip. What can I do in the bedroom? Use night lights. Make sure that you have a light by your bed that is easy to reach. Do not use any sheets or blankets that are too big for your bed. They should not hang down onto the floor. Have a firm chair that has side arms. You can use this for support while you get dressed. Do not have throw rugs and other things on the floor that can make you trip. What can I do in the kitchen? Clean up any spills right away. Avoid walking on wet floors. Keep items that you use a lot in easy-to-reach places. If you need to reach something above you, use a strong step stool that has a grab bar.  Keep electrical cords out of the way. Do not use floor polish or wax that makes floors slippery. If you must use wax, use non-skid floor wax. Do not have throw rugs and other things on the floor that can make you trip. What can I do with my stairs? Do not leave any items on the stairs. Make sure that there are handrails on both sides of the stairs and use them. Fix handrails that are broken or loose. Make sure that handrails are as long as the stairways. Check any carpeting to make sure that it is firmly attached to the stairs. Fix any carpet that is loose or worn. Avoid having throw rugs at the top or bottom of the stairs. If you do have throw rugs, attach them to the floor with carpet tape. Make sure that you have a light switch at the top of the stairs and the bottom of the stairs. If you do not have them, ask someone to add them for you. What else can I do to help prevent falls? Wear shoes that: Do not  have high heels. Have rubber bottoms. Are comfortable and fit you well. Are closed at the toe. Do not wear sandals. If you use a stepladder: Make sure that it is fully opened. Do not climb a closed stepladder. Make sure that both sides of the stepladder are locked into place. Ask someone to hold it for you, if possible. Clearly mark and make sure that you can see: Any grab bars or handrails. First and last steps. Where the edge of each step is. Use tools that help you move around (mobility aids) if they are needed. These include: Canes. Walkers. Scooters. Crutches. Turn on the lights when you go into a dark area. Replace any light bulbs as soon as they burn out. Set up your furniture so you have a clear path. Avoid moving your furniture around. If any of your floors are uneven, fix them. If there are any pets around you, be aware of where they are. Review your medicines with your doctor. Some medicines can make you feel dizzy. This can increase your chance of falling. Ask your doctor what other things that you can do to help prevent falls. This information is not intended to replace advice given to you by your health care provider. Make sure you discuss any questions you have with your health care provider. Document Released: 01/28/2009 Document Revised: 09/09/2015 Document Reviewed: 05/08/2014 Elsevier Interactive Patient Education  2017 [employer].

## 2022-06-08 NOTE — Progress Notes (Signed)
Subjective:   Dana Small is a 77 y.o. female who presents for Medicare Annual (Subsequent) preventive examination.  Review of Systems     Cardiac Risk Factors include: advanced age (>45mn, >>53women);dyslipidemia;hypertension;family history of premature cardiovascular disease     Objective:    Today's Vitals   06/08/22 1403  BP: 120/60  Pulse: 67  Temp: (!) 97.3 F (36.3 C)  SpO2: 97%  Weight: 166 lb 3.2 oz (75.4 kg)  Height: '5\' 4"'$  (1.626 m)  PainSc: 0-No pain   Body mass index is 28.53 kg/m.     06/08/2022    2:10 PM 11/28/2021    4:07 PM 06/07/2021    4:05 PM 06/04/2020    4:48 PM 05/05/2020   11:14 AM 07/03/2014   11:39 AM  Advanced Directives  Does Patient Have a Medical Advance Directive? Yes Yes Yes Yes Yes No  Type of AParamedicof ATropicLiving will  Living will;Healthcare Power of Attorney Living will HWrigleyLiving will   Does patient want to make changes to medical advance directive?   No - Patient declined No - Patient declined No - Patient declined   Copy of [place] Chart? No - copy requested  No - copy requested  No - copy requested   Would patient like information on creating a medical advance directive?      Yes - Educational materials given    Current Medications (verified) Outpatient Encounter Medications as of 06/08/2022  Medication Sig   clotrimazole-betamethasone (LOTRISONE) cream Apply 1 Application topically 2 (two) times daily.   irbesartan (AVAPRO) 150 MG tablet Take 1 tablet (150 mg total) by mouth daily.   pravastatin (PRAVACHOL) 20 MG tablet TAKE 1 TABLET(20 MG) BY MOUTH DAILY   predniSONE (DELTASONE) 50 MG tablet Take for 5 days for severe pain while traveling internationally. May repeat if needed   traMADol (ULTRAM) 50 MG tablet Take 1 tablet (50 mg total) by mouth every 8 (eight) hours as needed for severe pain.   No facility-administered encounter medications on  file as of 06/08/2022.    Allergies (verified) Patient has no known allergies.   History: Past Medical History:  Diagnosis Date   Arthritis    Hypertension    Past Surgical History:  Procedure Laterality Date   [facility]   TOE SURGERY     tooth implant     Family History  Problem Relation Age of Onset   Heart disease Other        Parents   Hypertension Other        Parents   Diverticulitis Father    Breast cancer Neg Hx    Colon cancer Neg Hx    Esophageal cancer Neg Hx    Cancer Neg Hx    Stomach cancer Neg Hx    Rectal cancer Neg Hx    Social History   Socioeconomic History   Marital status: Married    Spouse name: Not on file   Number of children: Not on file   Years of education: Not on file   Highest education level: Not on file  Occupational History   Not on file  Tobacco Use   Smoking status: Never   Smokeless tobacco: Never  Vaping Use   Vaping Use: Never used  Substance and Sexual Activity   Alcohol use: Yes   Drug use: Never   Sexual activity: Not on file  Other Topics Concern   Not on file  Social History Narrative   Not on file   Social Determinants of Health   Financial Resource Strain: Low Risk  (06/08/2022)   Overall Financial Resource Strain (CARDIA)    Difficulty of Paying Living Expenses: Not hard at all  Food Insecurity: No Food Insecurity (06/08/2022)   Hunger Vital Sign    Worried About Running Out of Food in the Last Year: Never true    Ran Out of Food in the Last Year: Never true  Transportation Needs: No Transportation Needs (06/08/2022)   PRAPARE - Hydrologist (Medical): No    Lack of Transportation (Non-Medical): No  Physical Activity: Sufficiently Active (06/08/2022)   Exercise Vital Sign    Days of Exercise per Week: 5 days    Minutes of Exercise per Session: 30 min  Stress: No Stress Concern Present (06/08/2022)   [place]    Feeling of Stress : Not at all  Social Connections: [place] (06/08/2022)   Social Connection and Isolation Panel [NHANES]    Frequency of Communication with Friends and Family: More than three times a week    Frequency of Social Gatherings with Friends and Family: More than three times a week    Attends Religious Services: More than 4 times per year    Active Member of [employer] or Organizations: Yes    Attends Music therapist: More than 4 times per year    Marital Status: Married    Tobacco Counseling Counseling given: Not Answered   Clinical Intake:  Pre-visit preparation completed: Yes  Pain : No/denies pain Pain Score: 0-No pain     Nutritional Risks: None Diabetes: No  How often do you need to have someone help you when you read instructions, pamphlets, or other written materials from your doctor or pharmacy?: 1 - Never What is the last grade level you completed in school?: HSG  Diabetic? no  Interpreter Needed?: No  Information entered by :: Lisette Abu, LPN.   Activities of Daily Living    06/08/2022    2:06 PM  In your present state of health, do you have any difficulty performing the following activities:  Hearing? 0  Vision? 0  Difficulty concentrating or making decisions? 0  Walking or climbing stairs? 0  Dressing or bathing? 0  Doing errands, shopping? 0  Preparing Food and eating ? N  Using the Toilet? N  In the past six months, have you accidently leaked urine? N  Do you have problems with loss of bowel control? N  Managing your Medications? N  Managing your Finances? N  Housekeeping or managing your Housekeeping? N    Patient Care Team: Hoyt Koch, MD as PCP - General (Internal Medicine) [place], [facility] as Consulting Physician (Ophthalmology) Melissa Noon, [place] as Referring Physician (Optometry)  Indicate any recent Medical Services you may  have received from other than [facility] providers in the past year (date may be approximate).     Assessment:   This is a routine wellness examination for Dana Small.  Hearing/Vision screen Hearing Screening - Comments:: Patient has hearing difficulties. No hearing aids.  Vision Screening - Comments:: Wears rx glasses - up to date with routine eye exams with Melissa Noon, OD.   Dietary issues and exercise activities discussed: Current Exercise Habits: Home exercise routine;Structured exercise class, Type of exercise: walking, Time (Minutes): 30, Frequency (Times/Week): 5,  Weekly Exercise (Minutes/Week): 150, Intensity: Moderate, Exercise limited by: None identified   Goals Addressed             This Visit's Progress    patient stated       My goal for 2024 is continue to travel all over the world and enjoy life.      Depression Screen    06/08/2022    2:05 PM 01/24/2022    3:24 PM 12/13/2021    2:02 PM 06/07/2021    4:03 PM 05/05/2020   11:12 AM 04/02/2020    9:08 AM 04/24/2019    1:51 PM  PHQ 2/9 Scores  PHQ - 2 Score 0 0 0 0 2 2 0    Fall Risk    06/08/2022    2:06 PM 01/24/2022    3:24 PM 12/13/2021    2:02 PM 06/07/2021    4:06 PM 05/05/2020   11:14 AM  [place] in the past year? 0 0 0 0 0  Number falls in past yr: 0 0 0 0 0  Injury with Fall? 0 0 0 0 0  Risk for fall due to : No Fall Risks No Fall Risks No Fall Risks No Fall Risks No Fall Risks  Follow up Falls prevention discussed Falls evaluation completed Falls evaluation completed Falls evaluation completed Falls evaluation completed    [place]:  Any stairs in or around the home? Yes  If so, are there any without handrails? No  Home free of loose throw rugs in walkways, pet beds, electrical cords, etc? Yes  Adequate lighting in your home to reduce risk of falls? Yes   ASSISTIVE DEVICES UTILIZED TO PREVENT FALLS:  Life alert? No  Use of a cane, walker or w/c? No  Grab  bars in the bathroom? No  Shower chair or bench in shower? Yes  Elevated toilet seat or a handicapped toilet? No   TIMED UP AND GO:  Was the test performed? Yes .  Length of time to ambulate 10 feet: 8 sec.   Gait steady and fast without use of assistive device  Cognitive Function:        06/08/2022    2:06 PM 06/07/2021    4:07 PM  6CIT Screen  What Year? 0 points 0 points  What month? 0 points 0 points  What time? 0 points 0 points  Count back from 20 0 points 0 points  Months in reverse 0 points 0 points  Repeat phrase 0 points 0 points  Total Score 0 points 0 points    Immunizations Immunization History  Administered Date(s) Administered   Fluad Quad(high Dose 65+) 12/11/2018, 01/20/2020, 01/24/2021   Influenza, High Dose Seasonal PF 01/11/2016, 01/01/2017, 01/24/2018   Influenza-Unspecified 01/15/2014   Moderna SARS-COV2 Booster Vaccination 10/06/2020   Moderna Sars-Covid-2 Vaccination 05/13/2019, 06/10/2019   PFIZER Comirnaty(Gray Top)Covid-19 Tri-Sucrose Vaccine 01/17/2022   Pfizer Covid-19 Vaccine Bivalent Booster 71yr & up 01/11/2021   Pneumococcal Polysaccharide-23 10/11/2015   Tdap 03/20/2016   Zoster Recombinat (Shingrix) 03/25/2020, 07/09/2020    TDAP status: Up to date  Flu Vaccine status: Up to date  Pneumococcal vaccine status: Due, Education has been provided regarding the importance of this vaccine. Advised may receive this vaccine at local pharmacy or Health Dept. Aware to provide a copy of the vaccination record if obtained from local pharmacy or Health Dept. Verbalized acceptance and understanding.  Covid-19 vaccine status: Completed vaccines  Qualifies for  Shingles Vaccine? Yes   Zostavax completed No   Shingrix Completed?: Yes  Screening Tests Health Maintenance  Topic Date Due   Hepatitis C Screening  Never done   Pneumonia Vaccine 6+ Years old (2 of 2 - PCV) 10/10/2016   COVID-19 Vaccine (6 - 2023-24 season) 03/14/2022   INFLUENZA  VACCINE  07/16/2022 (Originally 11/15/2021)   Medicare Annual Wellness (AWV)  06/09/2023   COLONOSCOPY (Pts 45-63yr Insurance coverage will need to be confirmed)  02/17/2024   DTaP/Tdap/Td (2 - Td or Tdap) 03/20/2026   DEXA SCAN  Completed   Zoster Vaccines- Shingrix  Completed   HPV VACCINES  Aged Out    Health Maintenance  Health Maintenance Due  Topic Date Due   Hepatitis C Screening  Never done   Pneumonia Vaccine 77 Years old (2 of 2 - PCV) 10/10/2016   COVID-19 Vaccine (6 - 2023-24 season) 03/14/2022    Colorectal cancer screening: Type of screening: Colonoscopy. Completed 02/17/2019. Repeat every 5 years  Mammogram status: Completed 06/07/2022. Repeat every year  Bone Density status: Completed 07/29/2019. Results reflect: Bone density results: OSTEOPENIA. Repeat every 2-3 years.  Lung Cancer Screening: (Low Dose CT Chest recommended if Age 77-80years, 30 pack-year currently smoking OR have quit w/in 15years.) does not qualify.   Lung Cancer Screening Referral: no  Additional Screening:  Hepatitis C Screening: does qualify; Completed no  Vision Screening: Recommended annual ophthalmology exams for early detection of glaucoma and other disorders of the eye. Is the patient up to date with their annual eye exam?  Yes  Who is the provider or what is the name of the office in which the patient attends annual eye exams? JMelissa Noon OD. If pt is not established with a provider, would they like to be referred to a provider to establish care? No .   Dental Screening: Recommended annual dental exams for proper oral hygiene  Community Resource Referral / Chronic Care Management: CRR required this visit?  No   CCM required this visit?  No      Plan:     I have personally reviewed and noted the following in the patient's chart:   Medical and social history Use of alcohol, tobacco or illicit drugs  Current medications and supplements including opioid prescriptions. Patient  is not currently taking opioid prescriptions. Functional ability and status Nutritional status Physical activity Advanced directives List of other physicians Hospitalizations, surgeries, and ER visits in previous 12 months Vitals Screenings to include cognitive, depression, and falls Referrals and appointments  In addition, I have reviewed and discussed with patient certain preventive protocols, quality metrics, and best practice recommendations. A written personalized care plan for preventive services as well as general preventive health recommendations were provided to patient.     SSheral Flow LPN   [id number]  Nurse Notes: Normal cognitive status assessed by direct observation by this Nurse Health Advisor. No abnormalities found.   Patient declined printed AVS.

## 2022-06-12 ENCOUNTER — Other Ambulatory Visit: Payer: Self-pay | Admitting: Internal Medicine

## 2022-06-12 DIAGNOSIS — R928 Other abnormal and inconclusive findings on diagnostic imaging of breast: Secondary | ICD-10-CM

## 2022-06-23 ENCOUNTER — Encounter: Payer: Self-pay | Admitting: Internal Medicine

## 2022-06-23 ENCOUNTER — Ambulatory Visit
Admission: RE | Admit: 2022-06-23 | Discharge: 2022-06-23 | Disposition: A | Discharge status: Home / Self Care | Payer: Medicare Other | Source: Ambulatory Visit | Attending: Internal Medicine | Admitting: Internal Medicine

## 2022-06-23 ENCOUNTER — Ambulatory Visit: Payer: Medicare Other

## 2022-06-23 DIAGNOSIS — R928 Other abnormal and inconclusive findings on diagnostic imaging of breast: Secondary | ICD-10-CM

## 2022-06-23 DIAGNOSIS — R922 Inconclusive mammogram: Secondary | ICD-10-CM | POA: Diagnosis not present

## 2022-06-23 LAB — HM MAMMOGRAPHY

## 2022-07-03 ENCOUNTER — Ambulatory Visit (INDEPENDENT_AMBULATORY_CARE_PROVIDER_SITE_OTHER): Payer: Medicare Other | Admitting: Internal Medicine

## 2022-07-03 ENCOUNTER — Encounter: Payer: Self-pay | Admitting: Internal Medicine

## 2022-07-03 VITALS — BP 140/60 | HR 56 | Temp 97.7°F | Ht 64.0 in | Wt 169.0 lb

## 2022-07-03 DIAGNOSIS — E785 Hyperlipidemia, unspecified: Secondary | ICD-10-CM | POA: Diagnosis not present

## 2022-07-03 DIAGNOSIS — R7303 Prediabetes: Secondary | ICD-10-CM

## 2022-07-03 DIAGNOSIS — E559 Vitamin D deficiency, unspecified: Secondary | ICD-10-CM | POA: Diagnosis not present

## 2022-07-03 DIAGNOSIS — I10 Essential (primary) hypertension: Secondary | ICD-10-CM | POA: Diagnosis not present

## 2022-07-03 LAB — MAGNESIUM: Magnesium: 2.2 mg/dL (ref 1.5–2.5)

## 2022-07-03 LAB — HEMOGLOBIN A1C: Hgb A1c MFr Bld: 5.9 % (ref 4.6–6.5)

## 2022-07-03 LAB — VITAMIN D 25 HYDROXY (VIT D DEFICIENCY, FRACTURES): VITD: 46.17 ng/mL (ref 30.00–100.00)

## 2022-07-03 LAB — LIPID PANEL
Cholesterol: 178 mg/dL (ref 0–200)
HDL: 65 mg/dL (ref >39.00)
LDL Cholesterol: 95 mg/dL (ref 0–99)
NonHDL: 112.76
Total CHOL/HDL Ratio: 3
Triglycerides: 87 mg/dL (ref 0.0–149.0)
VLDL: 17.4 mg/dL (ref 0.0–40.0)

## 2022-07-03 LAB — COMPREHENSIVE METABOLIC PANEL
ALT: 23 U/L (ref 0–35)
AST: 26 U/L (ref 0–37)
Albumin: 4.1 g/dL (ref 3.5–5.2)
Alkaline Phosphatase: 77 U/L (ref 39–117)
BUN: 26 mg/dL — ABNORMAL HIGH (ref 6–23)
CO2: 31 mEq/L (ref 19–32)
Calcium: 9.8 mg/dL (ref 8.4–10.5)
Chloride: 103 mEq/L (ref 96–112)
Creatinine, Ser: 0.69 mg/dL (ref 0.40–1.20)
GFR: 84.31 mL/min (ref >60.00)
Glucose, Bld: 98 mg/dL (ref 70–99)
Potassium: 4.6 mEq/L (ref 3.5–5.1)
Sodium: 141 mEq/L (ref 135–145)
Total Bilirubin: 0.4 mg/dL (ref 0.2–1.2)
Total Protein: 7.2 g/dL (ref 6.0–8.3)

## 2022-07-03 LAB — VITAMIN B12: Vitamin B-12: 844 pg/mL (ref 211–911)

## 2022-07-03 NOTE — Assessment & Plan Note (Signed)
Checking lipid panel and adjust as needed pravastatin 20 mg daily.

## 2022-07-03 NOTE — Assessment & Plan Note (Signed)
Checking HgA1c as not checked in some time. No new symptoms. Adjust as needed.

## 2022-07-03 NOTE — Assessment & Plan Note (Addendum)
Checking CMP and magnesium. Adjust irbesartan 150 mg daily as needed. BP borderline today but normal at home will keep same regimen.

## 2022-07-03 NOTE — Progress Notes (Signed)
   Subjective:   Patient ID: Dana Small, female    DOB: 1945-04-19, 77 y.o.   MRN: [id number]  HPI The patient is a 77 YO female coming in for follow up.  Review of Systems  Constitutional: Negative.   HENT: Negative.    Eyes: Negative.   Respiratory:  Negative for cough, chest tightness and shortness of breath.   Cardiovascular:  Negative for chest pain, palpitations and leg swelling.  Gastrointestinal:  Negative for abdominal distention, abdominal pain, constipation, diarrhea, nausea and vomiting.  Musculoskeletal: Negative.   Skin: Negative.   Neurological: Negative.   Psychiatric/Behavioral: Negative.      Objective:  Physical Exam Constitutional:      Appearance: She is well-developed.  HENT:     Head: Normocephalic and atraumatic.  Cardiovascular:     Rate and Rhythm: Normal rate and regular rhythm.  Pulmonary:     Effort: Pulmonary effort is normal. No respiratory distress.     Breath sounds: Normal breath sounds. No wheezing or rales.  Abdominal:     General: Bowel sounds are normal. There is no distension.     Palpations: Abdomen is soft.     Tenderness: There is no abdominal tenderness. There is no rebound.  Musculoskeletal:     Cervical back: Normal range of motion.  Skin:    General: Skin is warm and dry.  Neurological:     Mental Status: She is alert and oriented to person, place, and time.     Coordination: Coordination normal.     Vitals:   07/03/22 1348 07/03/22 1351  BP: (!) 140/60 (!) 140/60  Pulse: (!) 56   Temp: 97.7 F (36.5 C)   TempSrc: Oral   SpO2: 99%   Weight: 169 lb (76.7 kg)   Height: 5\' 4"  (1.626 m)     Assessment & Plan:

## 2022-07-03 NOTE — Patient Instructions (Signed)
We will check the labs today. 

## 2022-07-04 ENCOUNTER — Ambulatory Visit: Payer: Medicare Other | Admitting: Internal Medicine

## 2022-09-05 ENCOUNTER — Encounter: Payer: Self-pay | Admitting: Dermatology

## 2022-09-05 ENCOUNTER — Ambulatory Visit (INDEPENDENT_AMBULATORY_CARE_PROVIDER_SITE_OTHER): Payer: Medicare Other | Admitting: Dermatology

## 2022-09-05 VITALS — BP 141/76

## 2022-09-05 DIAGNOSIS — W908XXA Exposure to other nonionizing radiation, initial encounter: Secondary | ICD-10-CM

## 2022-09-05 DIAGNOSIS — X32XXXA Exposure to sunlight, initial encounter: Secondary | ICD-10-CM | POA: Diagnosis not present

## 2022-09-05 DIAGNOSIS — L409 Psoriasis, unspecified: Secondary | ICD-10-CM | POA: Diagnosis not present

## 2022-09-05 DIAGNOSIS — L821 Other seborrheic keratosis: Secondary | ICD-10-CM | POA: Diagnosis not present

## 2022-09-05 DIAGNOSIS — L814 Other melanin hyperpigmentation: Secondary | ICD-10-CM | POA: Diagnosis not present

## 2022-09-05 DIAGNOSIS — L820 Inflamed seborrheic keratosis: Secondary | ICD-10-CM

## 2022-09-05 DIAGNOSIS — Z1283 Encounter for screening for malignant neoplasm of skin: Secondary | ICD-10-CM

## 2022-09-05 DIAGNOSIS — L578 Other skin changes due to chronic exposure to nonionizing radiation: Secondary | ICD-10-CM

## 2022-09-05 DIAGNOSIS — D489 Neoplasm of uncertain behavior, unspecified: Secondary | ICD-10-CM

## 2022-09-05 DIAGNOSIS — D1801 Hemangioma of skin and subcutaneous tissue: Secondary | ICD-10-CM

## 2022-09-05 MED ORDER — TRIAMCINOLONE ACETONIDE 0.1 % EX CREA
1.0000 | TOPICAL_CREAM | Freq: Two times a day (BID) | CUTANEOUS | 11 refills | Status: DC | PRN
Start: 2022-09-05 — End: 2024-03-03

## 2022-09-05 MED ORDER — CLOBETASOL PROPIONATE 0.05 % EX SOLN
1.0000 | Freq: Two times a day (BID) | CUTANEOUS | 0 refills | Status: DC
Start: 2022-09-05 — End: 2024-03-03

## 2022-09-05 NOTE — Patient Instructions (Addendum)
Patient Handout: Wound Care for Skin Biopsy Site  Patient Handout: Wound Care for Skin Biopsy Site  Taking Care of Your Skin Biopsy Site  Proper care of the biopsy site is essential for promoting healing and minimizing scarring. This handout provides instructions on how to care for your biopsy site to ensure optimal recovery.  1. Cleaning the Wound:  Clean the biopsy site daily with gentle soap and water. Gently pat the area dry with a clean, soft towel. Avoid harsh scrubbing or rubbing the area, as this can irritate the skin and delay healing.  2. Applying Aquaphor and Bandage:  After cleaning the wound, apply a thin layer of Aquaphor ointment to the biopsy site. Cover the area with a sterile bandage to protect it from dirt, bacteria, and friction. Change the bandage daily or as needed if it becomes soiled or wet.  3. Continued Care for One Week:  Repeat the cleaning, Aquaphor application, and bandaging process daily for one week following the biopsy procedure. Keeping the wound clean and moist during this initial healing period will help prevent infection and promote optimal healing.  4. Massaging Aquaphor into the Area:  ---After one week, discontinue the use of bandages but continue to apply Aquaphor to the biopsy site. ----Gently massage the Aquaphor into the area using circular motions. ---Massaging the skin helps to promote circulation and prevent the formation of scar tissue.   Additional Tips:  Avoid exposing the biopsy site to direct sunlight during the healing process, as this can cause hyperpigmentation or worsen scarring. If you experience any signs of infection, such as increased redness, swelling, warmth, or drainage from the wound, contact your healthcare provider immediately. Follow any additional instructions provided by your healthcare provider for caring for the biopsy site and managing any discomfort. Conclusion:  Taking proper care of your skin biopsy site  is crucial for ensuring optimal healing and minimizing scarring. By following these instructions for cleaning, applying Aquaphor, and massaging the area, you can promote a smooth and successful recovery. If you have any questions or concerns about caring for your biopsy site, don't hesitate to contact your healthcare provider for guidance.      Due to recent changes in healthcare laws, you may see results of your pathology and/or laboratory studies on MyChart before the doctors have had a chance to review them. We understand that in some cases there may be results that are confusing or concerning to you. Please understand that not all results are received at the same time and often the doctors may need to interpret multiple results in order to provide you with the best plan of care or course of treatment. Therefore, we ask that you please give us 2 business days to thoroughly review all your results before contacting the office for clarification. Should we see a critical lab result, you will be contacted sooner.   If You Need Anything After Your Visit  If you have any questions or concerns for your doctor, please call our main line at [phone] If no one answers, please leave a voicemail as directed and we will return your call as soon as possible. Messages left after 4 pm will be answered the following business day.   You may also send us a message via MyChart. We typically respond to MyChart messages within 1-2 business days.  For prescription refills, please ask your pharmacy to contact our office. Our fax number is [phone].  If you have an urgent issue when the clinic is   closed that cannot wait until the next business day, you can page your doctor at the number below.    Please note that while we do our best to be available for urgent issues outside of office hours, we are not available 24/7.   If you have an urgent issue and are unable to reach us, you may choose to seek medical care  at your doctor's office, retail clinic, urgent care center, or emergency room.  If you have a medical emergency, please immediately call 911 or go to the emergency department. In the event of inclement weather, please call our main line at [phone] for an update on the status of any delays or closures.  Dermatology Medication Tips: Please keep the boxes that topical medications come in in order to help keep track of the instructions about where and how to use these. Pharmacies typically print the medication instructions only on the boxes and not directly on the medication tubes.   If your medication is too expensive, please contact our office at [phone] or send us a message through MyChart.   We are unable to tell what your co-pay for medications will be in advance as this is different depending on your insurance coverage. However, we may be able to find a substitute medication at lower cost or fill out paperwork to get insurance to cover a needed medication.   If a prior authorization is required to get your medication covered by your insurance company, please allow us 1-2 business days to complete this process.  Drug prices often vary depending on where the prescription is filled and some pharmacies may offer cheaper prices.  The website www.goodrx.com contains coupons for medications through different pharmacies. The prices here do not account for what the cost may be with help from insurance (it may be cheaper with your insurance), but the website can give you the price if you did not use any insurance.  - You can print the associated coupon and take it with your prescription to the pharmacy.  - You may also stop by our office during regular business hours and pick up a GoodRx coupon card.  - If you need your prescription sent electronically to a different pharmacy, notify our office through [facility] MyChart or by phone at [phone]     

## 2022-09-05 NOTE — Progress Notes (Signed)
New Patient Visit   Subjective  Dana Small is a 77 y.o. female who presents for the following: Skin Cancer Screening and Full Body Skin Exam  Patient c/o Left ear scaly and itchy, Lesion of Left Upper Arm,Itchy Scalp for several years with no previous prescriptions and/or treatment.  The patient presents for Total-Body Skin Exam (TBSE) for skin cancer screening and mole check. The patient has spots, moles and lesions to be evaluated, some may be new or changing and the patient has concerns that these could be cancer.   The following portions of the chart were reviewed this encounter and updated as appropriate: medications, allergies, medical history  Review of Systems:  No other skin or systemic complaints except as noted in HPI or Assessment and Plan.  Objective  Well appearing patient in no apparent distress; mood and affect are within normal limits.  A full examination was performed including scalp, head, eyes, ears, nose, lips, neck, chest, axillae, abdomen, back, buttocks, bilateral upper extremities, bilateral lower extremities, hands, feet, fingers, toes, fingernails, and toenails. All findings within normal limits unless otherwise noted below.   Relevant physical exam findings are noted in the Assessment and Plan.  Left Upper Arm - Anterior 1cm pink crusted plaque    Assessment & Plan   LENTIGINES, SEBORRHEIC KERATOSES, HEMANGIOMAS - Benign normal skin lesions - Benign-appearing - Call for any changes  MELANOCYTIC NEVI - Tan-brown and/or pink-flesh-colored symmetric macules and papules - Benign appearing on exam today - Observation - Call clinic for new or changing moles - Recommend daily use of broad spectrum spf 30+ sunscreen to sun-exposed areas.   ACTINIC DAMAGE - Chronic condition, secondary to cumulative UV/sun exposure - diffuse scaly erythematous macules with underlying dyspigmentation - Recommend daily broad spectrum sunscreen SPF 30+ to sun-exposed  areas, reapply every 2 hours as needed.  - Staying in the shade or wearing long sleeves, sun glasses (UVA+UVB protection) and wide brim hats (4-inch brim around the entire circumference of the hat) are also recommended for sun protection.  - Call for new or changing lesions.  SKIN CANCER SCREENING PERFORMED TODAY.   PSORIASIS Exam: Well-demarcated erythematous papules/plaques with silvery scale, guttate pink scaly papules. 1% BSA,   wellcontrolled vs notatgoal vs flared  patient denies joint pain in the morning  Psoriasis is a chronic non-curable, but treatable genetic/hereditary disease that may have other systemic features affecting other organ systems such as joints (Psoriatic Arthritis). It is associated with an increased risk of inflammatory bowel disease, heart disease, non-alcoholic fatty liver disease, and depression.  Treatments include light and laser treatments; topical medications; and systemic medications including oral and injectables.  Treatment Plan: -Scalp; Clobetasol Solution  -Body; Triamcinlone Cream 2 weeks on, 2 weeks off   Neoplasm of uncertain behavior Left Upper Arm - Anterior  Skin / nail biopsy Type of biopsy: tangential   Informed consent: discussed and consent obtained   Timeout: patient name, date of birth, surgical site, and procedure verified   Procedure prep:  Patient was prepped and draped in usual sterile fashion Prep type:  Isopropyl alcohol Anesthesia: the lesion was anesthetized in a standard fashion   Anesthetic:  1% lidocaine w/ epinephrine 1-100,000 buffered w/ 8.4% NaHCO3 Instrument used: DermaBlade   Hemostasis achieved with: aluminum chloride   Outcome: patient tolerated procedure well   Post-procedure details: sterile dressing applied and wound care instructions given   Dressing type: petrolatum gauze and bandage    Specimen 1 - Surgical pathology Differential Diagnosis:  R/O Wart vs psoriasis  Check Margins:  No  Psoriasis  Related Medications clobetasol (TEMOVATE) 0.05 % external solution Apply 1 Application topically 2 (two) times daily. Apply to scalp 2x daily until cleared and/or PRN  triamcinolone cream (KENALOG) 0.1 % Apply 1 Application topically 2 (two) times daily as needed. For itch, apply for 2 weeks then stop, Repeat as needed    Return in about 3 years (around 09/04/2025) for LUE Skin lesion/Bx results.    Documentation: I have reviewed the above documentation for accuracy and completeness, and I agree with the above.  Stasia Cavalier, am acting as scribe for Langston Reusing, DO.   Langston Reusing, DO

## 2022-10-31 ENCOUNTER — Other Ambulatory Visit: Payer: Self-pay

## 2022-10-31 ENCOUNTER — Ambulatory Visit (INDEPENDENT_AMBULATORY_CARE_PROVIDER_SITE_OTHER): Payer: Medicare Other | Admitting: Family Medicine

## 2022-10-31 VITALS — BP 192/82 | HR 65 | Ht 64.0 in | Wt 170.0 lb

## 2022-10-31 DIAGNOSIS — M25512 Pain in left shoulder: Secondary | ICD-10-CM | POA: Diagnosis not present

## 2022-10-31 DIAGNOSIS — G8929 Other chronic pain: Secondary | ICD-10-CM | POA: Diagnosis not present

## 2022-10-31 NOTE — Patient Instructions (Signed)
Thank you for coming in today.   You received an injection today. Seek immediate medical attention if the joint becomes red, extremely painful, or is oozing fluid.  

## 2022-10-31 NOTE — Progress Notes (Signed)
   Dana Payor, PhD, LAT, ATC acting as a scribe for Clementeen Graham, MD.  Dana Small is a 77 y.o. female who presents to [facility] at [facility] today for exacerbation of her L shoulder pain. Pt was last seen by Dr. Denyse Amass on 01/09/22 and she was advised to cont HEP and was prescribed prednisone and tramadol as emergency back up. Pt was previously treated w/ PT at the [facility] location.  Today, pt reports she has been doing water aerobics and is having pain w/ shoulder aBd/flex. She had been traveling in [place] for 2 months over the winter.  Dx imaging: 11/23/21 L shoulder XR   Pertinent review of systems: No fevers or chills  Relevant historical information: Hypertension   Exam:  BP (!) 192/82   Pulse 65   Ht 5\' 4"  (1.626 m)   Wt 170 lb (77.1 kg)   SpO2 99%   BMI 29.18 kg/m  General: Well Developed, well nourished, and in no acute distress.   MSK: Left shoulder normal-appearing Normal motion pain with abduction. Intact strength. Positive Hawkins and Neer's test.    Lab and Radiology Results  Procedure: Real-time Ultrasound Guided Injection of left shoulder subacromial bursa Device: Philips Affiniti 50G/GE Logiq Images permanently stored and available for review in PACS Verbal informed consent obtained.  Discussed risks and benefits of procedure. Warned about infection, bleeding, hyperglycemia damage to structures among others. Patient expresses understanding and agreement Time-out conducted.   Noted no overlying erythema, induration, or other signs of local infection.   Skin prepped in a sterile fashion.   Local anesthesia: Topical Ethyl chloride.   With sterile technique and under real time ultrasound guidance: 40 mg of Kenalog and 2 mL of Marcaine injected into subacromial bursa. Fluid seen entering the bursa.   Completed without difficulty   Pain immediately resolved suggesting accurate placement of the medication.   Advised to call if  fevers/chills, erythema, induration, drainage, or persistent bleeding.   Images permanently stored and available for review in the ultrasound unit.  Impression: Technically successful ultrasound guided injection.         Assessment and Plan: 77 y.o. female with left shoulder pain due to subacromial bursitis.  This is a continuation or exacerbation of a chronic problem.  Plan for subacromial injection today.  She previously has had physical therapy.  Continue or work a little harder now that her pain is getting better controlled with home exercise program.  Check back as needed.  Consider MRI as next step if needed.   PDMP not reviewed this encounter. Orders Placed This Encounter  Procedures   [place] LIMITED JOINT SPACE STRUCTURES UP LEFT(NO LINKED CHARGES)    Order Specific Question:   Reason for Exam (SYMPTOM  OR DIAGNOSIS REQUIRED)    Answer:   left shoulder pain    Order Specific Question:   Preferred imaging location?    Answer:   [facility]   No orders of the defined types were placed in this encounter.    Discussed warning signs or symptoms. Please see discharge instructions. Patient expresses understanding.   The above documentation has been reviewed and is accurate and complete Clementeen Graham, M.D.

## 2022-12-16 ENCOUNTER — Other Ambulatory Visit: Payer: Self-pay | Admitting: Internal Medicine

## 2022-12-16 DIAGNOSIS — I10 Essential (primary) hypertension: Secondary | ICD-10-CM

## 2023-01-10 ENCOUNTER — Ambulatory Visit: Payer: Medicare Other

## 2023-01-10 DIAGNOSIS — Z23 Encounter for immunization: Secondary | ICD-10-CM

## 2023-01-10 NOTE — Progress Notes (Signed)
Patient presented in the office today for HD flu vaccine. HD Flu vaccine administered into her Right Deltoid muscle. Patient tolerated injection well and injection site looked fine. Advised patient to report to the office if she noticed any adverse reaction.

## 2023-01-24 ENCOUNTER — Other Ambulatory Visit (HOSPITAL_BASED_OUTPATIENT_CLINIC_OR_DEPARTMENT_OTHER): Payer: Self-pay

## 2023-01-24 ENCOUNTER — Other Ambulatory Visit (HOSPITAL_COMMUNITY): Payer: Self-pay

## 2023-01-24 MED ORDER — COVID-19 MRNA VAC-TRIS(PFIZER) 30 MCG/0.3ML IM SUSY
0.3000 mL | PREFILLED_SYRINGE | Freq: Once | INTRAMUSCULAR | 0 refills | Status: AC
  Filled 2023-01-24: qty 0.3, 1d supply, fill #0

## 2023-01-25 ENCOUNTER — Ambulatory Visit: Payer: Medicare Other

## 2023-01-25 ENCOUNTER — Encounter: Payer: Self-pay | Admitting: Family Medicine

## 2023-01-25 ENCOUNTER — Ambulatory Visit: Payer: Medicare Other | Admitting: Family Medicine

## 2023-01-25 ENCOUNTER — Other Ambulatory Visit: Payer: Self-pay

## 2023-01-25 VITALS — BP 128/76 | HR 69 | Ht 64.0 in | Wt 168.0 lb

## 2023-01-25 DIAGNOSIS — G8929 Other chronic pain: Secondary | ICD-10-CM

## 2023-01-25 DIAGNOSIS — M25512 Pain in left shoulder: Secondary | ICD-10-CM

## 2023-01-25 NOTE — Progress Notes (Signed)
I, Stevenson Clinch, CMA acting as a scribe for Clementeen Graham, MD.  Dana Small is a 77 y.o. female who presents to [facility] at [facility] today for cont'd L shoulder pain. Pt was last seen by Dr. Denyse Amass on 10/31/22 and was given a L subacromial steroid injection and advised to work on HEP.  Today, pt reports continued left shoulder pain. C/O pain in the deltoid and biceps. Does HEP intermittently. Has upcoming trip 10/23. Trying to avoid NSAID's d/t elevated BUN 06/2022. Limited ROM overhead and reaching back. Denies n/t, weakness, decreased grip strength.   Dx imaging: 11/23/21 L shoulder XR   Pertinent review of systems: no fever or chills  Relevant historical information: Hypertension   Exam:  BP 128/76   Pulse 69   Ht 5\' 4"  (1.626 m)   Wt 168 lb (76.2 kg)   SpO2 97%   BMI 28.84 kg/m  General: Well Developed, well nourished, and in no acute distress.   MSK: Left shoulder and upper arm are normal-appearing. Normal shoulder motion pain with abduction. Nontender to palpation. Strength slightly reduced abduction.  Intact external and internal rotation. Positive Hawkins and Neer's test. Negative Yergason's and speeds test.    Lab and Radiology Results  Diagnostic Limited MSK Ultrasound of: Left shoulder and upper arm Intact.  Rotator cuff tendons without visible tear.  No significant subacromial bursitis is visible.  Benign-appearing shoulder No abnormality visible in upper arm. Impression: Unclear explanation of shoulder pain  X-ray images left shoulder obtained today personally and independently interpreted No acute fracture. No severe DJD. Normal.  Shoulder x-ray. Await formal radiology review   Assessment and Plan: 77 y.o. female with left shoulder pain.  Etiology is unclear.  She has had treatment for over a year now with not enough benefit.  She has had physical therapy and subacromial injection.  Plan for MRI to further clarify source of  pain.   PDMP not reviewed this encounter. Orders Placed This Encounter  Procedures   DG Shoulder Left    Standing Status:   Future    Number of Occurrences:   1    Standing Expiration Date:   01/25/2024    Order Specific Question:   Reason for Exam (SYMPTOM  OR DIAGNOSIS REQUIRED)    Answer:   eval shoulder pain left    Order Specific Question:   Preferred imaging location?    Answer:   [facility]   [place] LIMITED JOINT SPACE STRUCTURES UP LEFT(NO LINKED CHARGES)    Order Specific Question:   Reason for Exam (SYMPTOM  OR DIAGNOSIS REQUIRED)    Answer:   eval left shoulder    Order Specific Question:   Preferred imaging location?    Answer:   [facility]   MR SHOULDER LEFT WO CONTRAST    Standing Status:   Future    Standing Expiration Date:   01/25/2024    Order Specific Question:   What is the patient's sedation requirement?    Answer:   No Sedation    Order Specific Question:   Does the patient have a pacemaker or implanted devices?    Answer:   No    Order Specific Question:   Preferred imaging location?    Answer:   [facility] (table limit-550lbs)   No orders of the defined types were placed in this encounter.    Discussed warning signs or symptoms. Please see discharge instructions. Patient expresses understanding.  The above documentation has been reviewed and is accurate and complete Clementeen Graham, M.D.

## 2023-01-25 NOTE — Patient Instructions (Signed)
Thank you for coming in today.   Please get an Xray today before you leave   You should hear from MRI scheduling within 1 week. If you do not hear please let me know.    Keep me updated.

## 2023-01-29 ENCOUNTER — Telehealth: Payer: Self-pay

## 2023-01-29 NOTE — Telephone Encounter (Signed)
Spoke to pt and gave her the name of 2 orthopedic surgeons; Dr. Roda Shutters, August Saucer, and [place]. She stated that she would run these names by her son who is a Physiological scientist. She also asked if she should proceed to the MRI prior to seeing a surgeon and was advised yes, MRI 1st is the most logical course. She noted that she would only be in [place] for the next 2 wks, and then would be out of town traveling. She verbalized understanding.

## 2023-01-29 NOTE — Telephone Encounter (Signed)
Patient called and stated that Dr. Denyse Amass had three doctors that are specialized in shoulder surgery and she would like to have someone call her back with those  names because she did not write them down

## 2023-02-19 NOTE — Progress Notes (Signed)
Left shoulder x-ray shows some irritation where the rotator cuff tendon attaches to the shoulder bone and a little bit of arthritis of the small joint at the top of the shoulder called the [facility] joint or acromioclavicular joint.

## 2023-02-23 NOTE — Telephone Encounter (Signed)
Patient called back to follow up. She asked if she needs to continue with the MRI? She is scheduled for the MRI on Tuesday. Please advise.

## 2023-02-27 DIAGNOSIS — D692 Other nonthrombocytopenic purpura: Secondary | ICD-10-CM | POA: Diagnosis not present

## 2023-02-27 DIAGNOSIS — L219 Seborrheic dermatitis, unspecified: Secondary | ICD-10-CM | POA: Diagnosis not present

## 2023-02-27 DIAGNOSIS — L920 Granuloma annulare: Secondary | ICD-10-CM | POA: Diagnosis not present

## 2023-02-28 ENCOUNTER — Ambulatory Visit
Admission: RE | Admit: 2023-02-28 | Discharge: 2023-02-28 | Disposition: A | Discharge status: Home / Self Care | Payer: TRICARE For Life (TFL) | Source: Ambulatory Visit | Attending: Family Medicine | Admitting: Family Medicine

## 2023-02-28 DIAGNOSIS — M19012 Primary osteoarthritis, left shoulder: Secondary | ICD-10-CM | POA: Diagnosis not present

## 2023-02-28 DIAGNOSIS — M25512 Pain in left shoulder: Secondary | ICD-10-CM | POA: Diagnosis not present

## 2023-02-28 DIAGNOSIS — M7582 Other shoulder lesions, left shoulder: Secondary | ICD-10-CM | POA: Diagnosis not present

## 2023-02-28 DIAGNOSIS — G8929 Other chronic pain: Secondary | ICD-10-CM | POA: Diagnosis not present

## 2023-03-19 NOTE — Progress Notes (Signed)
MRI of the shoulder shows tendinitis of the rotator cuff tendon without tear and effectively a pothole of arthritis in the main shoulder joint.  This is probably the source of pain.  Recommend return to clinic to talk about the results in full detail and potentially have an injection inside the shoulder joint.

## 2023-03-20 NOTE — Progress Notes (Unsigned)
   Rubin Payor, PhD, LAT, ATC acting as a scribe for Clementeen Graham, MD.  Dana Small is a 77 y.o. female who presents to [facility] at [facility] today for f/u L shoulder pain w/ MRI review. Dana Small was last seen by Dr. Denyse Amass on 01/25/23 and a MRI was ordered.  Today, Dana Small reports ***  Dx imaging: 02/28/23 L shoulder MRI 11/23/21 L shoulder XR   Pertinent review of systems: ***  Relevant historical information: ***   Exam:  There were no vitals taken for this visit. General: Well Developed, well nourished, and in no acute distress.   MSK: ***    Lab and Radiology Results No results found for this or any previous visit (from the past 72 hour(s)). No results found.     Assessment and Plan: 77 y.o. female with ***   PDMP not reviewed this encounter. No orders of the defined types were placed in this encounter.  No orders of the defined types were placed in this encounter.    Discussed warning signs or symptoms. Please see discharge instructions. Patient expresses understanding.   ***

## 2023-03-21 ENCOUNTER — Ambulatory Visit: Payer: TRICARE For Life (TFL) | Admitting: Family Medicine

## 2023-03-21 ENCOUNTER — Other Ambulatory Visit: Payer: Self-pay

## 2023-03-21 VITALS — BP 120/80 | HR 63 | Ht 64.0 in | Wt 171.0 lb

## 2023-03-21 DIAGNOSIS — M25512 Pain in left shoulder: Secondary | ICD-10-CM

## 2023-03-21 DIAGNOSIS — R7303 Prediabetes: Secondary | ICD-10-CM

## 2023-03-21 DIAGNOSIS — G8929 Other chronic pain: Secondary | ICD-10-CM | POA: Diagnosis not present

## 2023-03-21 NOTE — Patient Instructions (Signed)
Thank you for coming in today.   Call or go to the ER if you develop a large red swollen joint with extreme pain or oozing puss.    If not good enough let me know. Next step is probably PT and then followed by a surgical consultation.

## 2023-04-03 ENCOUNTER — Ambulatory Visit (INDEPENDENT_AMBULATORY_CARE_PROVIDER_SITE_OTHER): Payer: Medicare Other | Admitting: Emergency Medicine

## 2023-04-03 VITALS — BP 128/68 | HR 74 | Temp 97.8°F | Ht 64.0 in | Wt 168.6 lb

## 2023-04-03 DIAGNOSIS — J069 Acute upper respiratory infection, unspecified: Secondary | ICD-10-CM | POA: Insufficient documentation

## 2023-04-03 DIAGNOSIS — R6889 Other general symptoms and signs: Secondary | ICD-10-CM | POA: Diagnosis not present

## 2023-04-03 NOTE — Patient Instructions (Signed)

## 2023-04-03 NOTE — Assessment & Plan Note (Signed)
Clinically stable and running its course without complications. Benign physical examination.  Afebrile. Symptom management discussed. Advised to rest and stay well-hydrated Advised to contact the office if no better or worse during the next several days

## 2023-04-03 NOTE — Assessment & Plan Note (Signed)
Symptom management discussed Advised to take over-the-counter Mucinex DM Advised to rest and stay well-hydrated

## 2023-04-03 NOTE — Progress Notes (Signed)
Dana Small 77 y.o.   Chief Complaint  Patient presents with   Cough    Started Friday. Patient states she only have a cough and just wants to make sure she doesn't have walking pneumonia or rsv. She also has questions of her skin getting bruises or blood vessels on her left arm     HISTORY OF PRESENT ILLNESS: This is a 77 y.o. female complaining of flulike symptoms that started 4 days ago.  Slowly getting better. Denies difficulty breathing or wheezing.  Denies fever or chills.  Mostly congestion and cough. No other associated symptoms.  No other complaints or medical concerns today.  Cough Pertinent negatives include no chest pain, chills, fever, headaches, rash or sore throat.     Prior to Admission medications   Medication Sig Start Date End Date Taking? Authorizing Provider  clobetasol (TEMOVATE) 0.05 % external solution Apply 1 Application topically 2 (two) times daily. Apply to scalp 2x daily until cleared and/or PRN 09/05/22  Yes Terri Piedra, DO  clotrimazole-betamethasone (LOTRISONE) cream Apply 1 Application topically 2 (two) times daily. 01/24/22  Yes Georgina Quint, MD  irbesartan (AVAPRO) 150 MG tablet TAKE 1 TABLET(150 MG) BY MOUTH DAILY 12/19/22  Yes Myrlene Broker, MD  pravastatin (PRAVACHOL) 20 MG tablet TAKE 1 TABLET(20 MG) BY MOUTH DAILY 12/13/21  Yes Myrlene Broker, MD  triamcinolone cream (KENALOG) 0.1 % Apply 1 Application topically 2 (two) times daily as needed. For itch, apply for 2 weeks then stop, Repeat as needed 09/05/22  Yes Terri Piedra, DO  predniSONE (DELTASONE) 50 MG tablet Take for 5 days for severe pain while traveling internationally. May repeat if needed Patient not taking: Reported on 04/03/2023 01/09/22   Rodolph Bong, MD  traMADol (ULTRAM) 50 MG tablet Take 1 tablet (50 mg total) by mouth every 8 (eight) hours as needed for severe pain. Patient not taking: Reported on 04/03/2023 01/09/22   Rodolph Bong, MD    No  Known Allergies  Patient Active Problem List   Diagnosis Date Noted   Vestibular hypofunction, bilateral 12/13/2021   Hyperlipidemia with target LDL less than 100 06/14/2020   Sensorineural hearing loss (SNHL) of both ears 05/26/2020   BPPV (benign paroxysmal positional vertigo), unspecified laterality 05/26/2020   Pre-diabetes 10/14/2019   Essential hypertension 06/18/2014   Osteoarthritis 06/18/2014   Routine general medical examination at a health care facility 06/18/2014    Past Medical History:  Diagnosis Date   Arthritis    Hypertension     Past Surgical History:  Procedure Laterality Date   COLONOSCOPY  06/11/2013   [facility]   TOE SURGERY     tooth implant      Social History   Socioeconomic History   Marital status: Married    Spouse name: Not on file   Number of children: Not on file   Years of education: Not on file   Highest education level: Associate degree: academic program  Occupational History   Not on file  Tobacco Use   Smoking status: Never   Smokeless tobacco: Never  Vaping Use   Vaping status: Never Used  Substance and Sexual Activity   Alcohol use: Yes   Drug use: Never   Sexual activity: Not on file  Other Topics Concern   Not on file  Social History Narrative   Not on file   Social Drivers of Health   Financial Resource Strain: Low Risk  (04/03/2023)   Overall  Financial Resource Strain (CARDIA)    Difficulty of Paying Living Expenses: Not hard at all  Food Insecurity: No Food Insecurity (04/03/2023)   Hunger Vital Sign    Worried About Running Out of Food in the Last Year: Never true    Ran Out of Food in the Last Year: Never true  Transportation Needs: No Transportation Needs (04/03/2023)   PRAPARE - Administrator, Civil Service (Medical): No    Lack of Transportation (Non-Medical): No  Physical Activity: Sufficiently Active (04/03/2023)   Exercise Vital Sign    Days of Exercise per Week: 4 days     Minutes of Exercise per Session: 50 min  Stress: Patient Declined (04/03/2023)   [employer] of Occupational Health - Occupational Stress Questionnaire    Feeling of Stress : Patient declined  Social Connections: Socially Integrated (04/03/2023)   Social Connection and Isolation Panel [NHANES]    Frequency of Communication with Friends and Family: More than three times a week    Frequency of Social Gatherings with Friends and Family: More than three times a week    Attends Religious Services: More than 4 times per year    Active Member of [employer] or Organizations: No    Attends Engineer, structural: More than 4 times per year    Marital Status: Married  Catering manager Violence: Not At Risk (06/08/2022)   Humiliation, Afraid, Rape, and Kick questionnaire    Fear of Current or Ex-Partner: No    Emotionally Abused: No    Physically Abused: No    Sexually Abused: No    Family History  Problem Relation Age of Onset   Heart disease Other        Parents   Hypertension Other        Parents   Diverticulitis Father    Breast cancer Neg Hx    Colon cancer Neg Hx    Esophageal cancer Neg Hx    Cancer Neg Hx    Stomach cancer Neg Hx    Rectal cancer Neg Hx      Review of Systems  Constitutional: Negative.  Negative for chills and fever.  HENT:  Positive for congestion. Negative for sore throat.   Respiratory:  Positive for cough.   Cardiovascular: Negative.  Negative for chest pain and palpitations.  Gastrointestinal:  Negative for abdominal pain, diarrhea, nausea and vomiting.  Genitourinary: Negative.  Negative for dysuria and hematuria.  Skin: Negative.  Negative for rash.  Neurological: Negative.  Negative for dizziness and headaches.  All other systems reviewed and are negative.   Today's Vitals   04/03/23 1257  BP: 128/68  Pulse: 74  Temp: 97.8 F (36.6 C)  TempSrc: Oral  SpO2: 97%  Weight: 168 lb 9.6 oz (76.5 kg)  Height: 5\' 4"  (1.626 m)   Body mass  index is 28.94 kg/m.   Physical Exam Vitals reviewed.  Constitutional:      Appearance: Normal appearance.  HENT:     Head: Normocephalic.  Eyes:     Extraocular Movements: Extraocular movements intact.  Cardiovascular:     Rate and Rhythm: Normal rate.     Pulses: Normal pulses.     Heart sounds: Normal heart sounds.  Pulmonary:     Effort: Pulmonary effort is normal.     Breath sounds: Normal breath sounds.  Skin:    General: Skin is warm and dry.  Neurological:     Mental Status: She is alert and oriented to person,  place, and time.  Psychiatric:        Mood and Affect: Mood normal.        Behavior: Behavior normal.      ASSESSMENT & PLAN: A total of 32 minutes was spent with the patient and counseling/coordination of care regarding preparing for this visit, review of most recent office visit notes, review of multiple chronic medical conditions under management, review of all medications, diagnosis of viral upper respiratory infection, symptom management, prognosis, documentation, and need for follow-up if no better or worse during the next several days..  Problem List Items Addressed This Visit       Respiratory   Upper respiratory infection, viral - Primary   Clinically stable and running its course without complications. Benign physical examination.  Afebrile. Symptom management discussed. Advised to rest and stay well-hydrated Advised to contact the office if no better or worse during the next several days        Other   Flu-like symptoms   Symptom management discussed Advised to take over-the-counter Mucinex DM Advised to rest and stay well-hydrated      Patient Instructions  Viral Respiratory Infection A viral respiratory infection is an illness that affects parts of the body that are used for breathing. These include the lungs, nose, and throat. It is caused by a germ called a virus. Some examples of this kind of infection are: A cold. The flu  (influenza). A respiratory syncytial virus (RSV) infection. What are the causes? This condition is caused by a virus. It spreads from person to person. You can get the virus if: You breathe in droplets from someone who is sick. You come in contact with people who are sick. You touch mucus or other fluid from a person who is sick. What are the signs or symptoms? Symptoms of this condition include: A stuffy or runny nose. A sore throat. A cough. Shortness of breath. Trouble breathing. Yellow or green fluid in the nose. Other symptoms may include: A fever. Sweating or chills. Tiredness (fatigue). Achy muscles. A headache. How is this treated? This condition may be treated with: Medicines that treat viruses. Medicines that make it easy to breathe. Medicines that are sprayed into the nose. Acetaminophen or NSAIDs, such as ibuprofen, to treat fever. Follow these instructions at home: Managing pain and congestion Take over-the-counter and prescription medicines only as told by your doctor. If you have a sore throat, gargle with salt water. Do this 3-4 times a day or as needed. To make salt water, dissolve -1 tsp (3-6 g) of salt in 1 cup (237 mL) of warm water. Make sure that all the salt dissolves. Use nose drops made from salt water. This helps with stuffiness (congestion). It also helps soften the skin around your nose. Take 2 tsp (10 mL) of honey at bedtime to lessen coughing at night. Do not give honey to children who are younger than 35 year old. Drink enough fluid to keep your pee (urine) pale yellow. General instructions  Rest as much as possible. Do not drink alcohol. Do not smoke or use any products that contain nicotine or tobacco. If you need help quitting, ask your doctor. Keep all follow-up visits. How is this prevented?     Get a flu shot every year. Ask your doctor when you should get your flu shot. Do not let other people get your germs. If you are sick: Wash  your hands with soap and water often. Wash your hands after you cough  or sneeze. Wash hands for at least 20 seconds. If you cannot use soap and water, use hand sanitizer. Cover your mouth when you cough. Cover your nose and mouth when you sneeze. Do not share cups or eating utensils. Clean commonly used objects often. Clean commonly touched surfaces. Stay home from work or school. Avoid contact with people who are sick during cold and flu season. This is in fall and winter. Get help if: Your symptoms last for 10 days or longer. Your symptoms get worse over time. You have very bad pain in your face or forehead. Parts of your jaw or neck get very swollen. You have shortness of breath. Get help right away if: You feel pain or pressure in your chest. You have trouble breathing. You faint or feel like you will faint. You keep vomiting and it gets worse. You feel confused. These symptoms may be an emergency. Get help right away. Call your local emergency services (911 in the [place] Do not wait to see if the symptoms will go away. Do not drive yourself to the hospital. Summary A viral respiratory infection is an illness that affects parts of the body that are used for breathing. Examples of this illness include a cold, the flu, and a respiratory syncytial virus (RSV) infection. The infection can cause a runny nose, cough, sore throat, and fever. Follow what your doctor tells you about taking medicines, drinking lots of fluid, washing your hands, resting at home, and avoiding people who are sick. This information is not intended to replace advice given to you by your health care provider. Make sure you discuss any questions you have with your health care provider. Document Revised: 07/08/2020 Document Reviewed: 07/08/2020 Elsevier Patient Education  2024 Elsevier Inc.    Edwina Barth, MD [facility]

## 2023-04-06 ENCOUNTER — Encounter: Payer: Self-pay | Admitting: Emergency Medicine

## 2023-04-06 MED ORDER — AZITHROMYCIN 250 MG PO TABS: ORAL_TABLET | ORAL | 1 refills | Status: AC

## 2023-05-04 ENCOUNTER — Other Ambulatory Visit: Payer: Self-pay | Admitting: Internal Medicine

## 2023-06-11 ENCOUNTER — Ambulatory Visit (INDEPENDENT_AMBULATORY_CARE_PROVIDER_SITE_OTHER): Payer: Medicare Other

## 2023-06-11 VITALS — BP 130/72 | HR 67 | Ht 65.0 in | Wt 170.2 lb

## 2023-06-11 DIAGNOSIS — Z0000 Encounter for general adult medical examination without abnormal findings: Secondary | ICD-10-CM | POA: Diagnosis not present

## 2023-06-11 DIAGNOSIS — H9193 Unspecified hearing loss, bilateral: Secondary | ICD-10-CM | POA: Diagnosis not present

## 2023-06-11 NOTE — Patient Instructions (Addendum)
 Dana Small , Thank you for taking time to come for your Medicare Wellness Visit. I appreciate your ongoing commitment to your health goals. Please review the following plan we discussed and let me know if I can assist you in the future.   Referrals/Orders/Follow-Ups/Clinician Recommendations: Aim for 30 minutes of exercise or brisk walking, 6-8 glasses of water, and 5 servings of fruits and vegetables each day. Educated and advised to get 2nd Pneumonia vaccine at the local pharmacy.  Repeat Mammogram is due in 2025.  Repeat Colonoscopy is due in 2025 (5-year repeat).   This is a list of the screening recommended for you and due dates:  Health Maintenance  Topic Date Due   Hepatitis C Screening  Never done   Pneumonia Vaccine (2 of 2 - PCV) 10/10/2016   COVID-19 Vaccine (6 - 2024-25 season) 03/21/2023   Colon Cancer Screening  02/17/2024   Medicare Annual Wellness Visit  06/10/2024   DTaP/Tdap/Td vaccine (2 - Td or Tdap) 03/20/2026   Flu Shot  Completed   DEXA scan (bone density measurement)  Completed   Zoster (Shingles) Vaccine  Completed   HPV Vaccine  Aged Out    Advanced directives: (Copy Requested) Please bring a copy of your health care power of attorney and living will to the office to be added to your chart at your convenience.  Next Medicare Annual Wellness Visit scheduled for next year: Yes - 05/2024

## 2023-06-11 NOTE — Progress Notes (Signed)
 Subjective:   Dana Small is a 78 y.o. who presents for a Medicare Wellness preventive visit.  Visit Complete: In person  AWV Questionnaire: No: Patient Medicare AWV questionnaire was not completed prior to this visit.  Cardiac Risk Factors include: advanced age (>39men, >27 women);dyslipidemia;hypertension     Objective:    Today's Vitals   06/11/23 1505  BP: 130/72  Pulse: 67  Weight: 170 lb 3.2 oz (77.2 kg)  Height: 5\' 5"  (1.651 m)   Body mass index is 28.32 kg/m.     06/11/2023    3:02 PM 06/08/2022    2:10 PM 11/28/2021    4:07 PM 06/07/2021    4:05 PM 06/04/2020    4:48 PM 05/05/2020   11:14 AM 07/03/2014   11:39 AM  Advanced Directives  Does Patient Have a Medical Advance Directive? Yes Yes Yes Yes Yes Yes No  Type of Estate agent of [place];Living will Healthcare Power of [place];Living will  Living will;Healthcare Power of Attorney Living will Healthcare Power of [place];Living will   Does patient want to make changes to medical advance directive?    No - Patient declined No - Patient declined No - Patient declined   Copy of Healthcare Power of Attorney in Chart? No - copy requested No - copy requested  No - copy requested  No - copy requested   Would patient like information on creating a medical advance directive?       Yes - Educational materials given    Current Medications (verified) Outpatient Encounter Medications as of 06/11/2023  Medication Sig   clobetasol (TEMOVATE) 0.05 % external solution Apply 1 Application topically 2 (two) times daily. Apply to scalp 2x daily until cleared and/or PRN   clotrimazole-betamethasone (LOTRISONE) cream Apply 1 Application topically 2 (two) times daily.   irbesartan (AVAPRO) 150 MG tablet TAKE 1 TABLET(150 MG) BY MOUTH DAILY   pravastatin (PRAVACHOL) 20 MG tablet TAKE 1 TABLET(20 MG) BY MOUTH DAILY   triamcinolone cream (KENALOG) 0.1 % Apply 1 Application topically 2 (two) times daily as needed.  For itch, apply for 2 weeks then stop, Repeat as needed   [DISCONTINUED] predniSONE (DELTASONE) 50 MG tablet Take for 5 days for severe pain while traveling internationally. May repeat if needed   [DISCONTINUED] traMADol (ULTRAM) 50 MG tablet Take 1 tablet (50 mg total) by mouth every 8 (eight) hours as needed for severe pain.   No facility-administered encounter medications on file as of 06/11/2023.    Allergies (verified) Patient has no known allergies.   History: Past Medical History:  Diagnosis Date   Arthritis    Hypertension    Past Surgical History:  Procedure Laterality Date   COLONOSCOPY  06/11/2013   [facility]   TOE SURGERY     tooth implant     Family History  Problem Relation Age of Onset   Heart disease Other        Parents   Hypertension Other        Parents   Diverticulitis Father    Breast cancer Neg Hx    Colon cancer Neg Hx    Esophageal cancer Neg Hx    Cancer Neg Hx    Stomach cancer Neg Hx    Rectal cancer Neg Hx    Social History   Socioeconomic History   Marital status: Married    Spouse name: Not on file   Number of children: Not on file   Years of education:  Not on file   Highest education level: Associate degree: academic program  Occupational History   Not on file  Tobacco Use   Smoking status: Never   Smokeless tobacco: Never  Vaping Use   Vaping status: Never Used  Substance and Sexual Activity   Alcohol use: Yes   Drug use: Never   Sexual activity: Not on file  Other Topics Concern   Not on file  Social History Narrative   Not on file   Social Drivers of Health   Financial Resource Strain: Low Risk  (06/11/2023)   Overall Financial Resource Strain (CARDIA)    Difficulty of Paying Living Expenses: Not hard at all  Food Insecurity: No Food Insecurity (06/11/2023)   Hunger Vital Sign    Worried About Running Out of Food in the Last Year: Never true    Ran Out of Food in the Last Year: Never true   Transportation Needs: No Transportation Needs (06/11/2023)   PRAPARE - Administrator, Civil Service (Medical): No    Lack of Transportation (Non-Medical): No  Physical Activity: Sufficiently Active (06/11/2023)   Exercise Vital Sign    Days of Exercise per Week: 5 days    Minutes of Exercise per Session: 50 min  Stress: No Stress Concern Present (06/11/2023)   [employer] of Occupational Health - Occupational Stress Questionnaire    Feeling of Stress : Not at all  Social Connections: Moderately Integrated (06/11/2023)   Social Connection and Isolation Panel [NHANES]    Frequency of Communication with Friends and Family: More than three times a week    Frequency of Social Gatherings with Friends and Family: Three times a week    Attends Religious Services: 1 to 4 times per year    Active Member of Clubs or Organizations: No    Attends Banker Meetings: Never    Marital Status: Married    Tobacco Counseling - Non-Smoker Counseling given - N/A   Clinical Intake: Completed 06/11/2023  Pre-visit preparation completed: Yes  Pain : No/denies pain     BMI - recorded: 28.32 Nutritional Status: BMI 25 -29 Overweight Nutritional Risks: None Diabetes: No  How often do you need to have someone help you when you read instructions, pamphlets, or other written materials from your doctor or pharmacy?: 1 - Never  Interpreter Needed?: No  Information entered by :: Hassell Halim, CMA   Activities of Daily Living Completed 06/11/2023    06/11/2023    3:12 PM  In your present state of health, do you have any difficulty performing the following activities:  Hearing? 1  Comment referral to Audiologist - inquire of hearing aids  Vision? 0  Difficulty concentrating or making decisions? 0  Walking or climbing stairs? 0  Dressing or bathing? 0  Doing errands, shopping? 0  Preparing Food and eating ? N  Using the Toilet? N  In the past six months, have you  accidently leaked urine? Y  Comment wears a pad - no referral to Urologist needed per pt  Do you have problems with loss of bowel control? N  Managing your Medications? N  Managing your Finances? N  Housekeeping or managing your Housekeeping? N    Patient Care Team: Myrlene Broker, MD as PCP - General (Internal Medicine) [place], [facility] as Consulting Physician (Ophthalmology) Manning Charity, OD as Referring Physician (Optometry)  Indicate any recent Medical Services you may have received from other than [facility] providers in the past year (  date may be approximate).     Assessment:   This is a routine wellness examination for [place].  Hearing/Vision screen Hearing Screening - Comments:: Having difficulty hearing - referral to Audiologist done today Vision Screening - Comments:: Wears rx glasses - up to date with routine eye exams with  Dr Emily Filbert   Goals Addressed               This Visit's Progress     Patient Stated (pt-stated)        Patient stated she wants to lose weight (lose about 15lbs).       Depression Screen Completed 06/11/2023    06/11/2023    3:21 PM 04/03/2023    1:06 PM 07/03/2022    1:54 PM 06/08/2022    2:05 PM 01/24/2022    3:24 PM 12/13/2021    2:02 PM 06/07/2021    4:03 PM  PHQ 2/9 Scores  PHQ - 2 Score 0 0 1 0 0 0 0  PHQ- 9 Score   2        Fall Risk Completed 06/11/2023    06/11/2023    3:15 PM 04/03/2023    1:06 PM 07/03/2022    1:54 PM 06/08/2022    2:06 PM 01/24/2022    3:24 PM  Fall Risk   Falls in the past year? 0 0 0 0 0  Number falls in past yr: 0 0 0 0 0  Injury with Fall? 0 0 0 0 0  Risk for fall due to : No Fall Risks No Fall Risks  No Fall Risks No Fall Risks  Follow up Falls prevention discussed;Falls evaluation completed Falls evaluation completed Falls evaluation completed Falls prevention discussed Falls evaluation completed    MEDICARE RISK AT HOME: Completed 06/11/2023 Medicare Risk at Home Any stairs  in or around the home?: Yes If so, are there any without handrails?: No Home free of loose throw rugs in walkways, pet beds, electrical cords, etc?: Yes Adequate lighting in your home to reduce risk of falls?: Yes Life alert?: No Use of a cane, walker or w/c?: No Grab bars in the bathroom?: Yes Shower chair or bench in shower?: Yes Elevated toilet seat or a handicapped toilet?: No  TIMED UP AND GO:  Was the test performed?  No  Cognitive Function: 6CIT completed        06/11/2023    3:16 PM 06/08/2022    2:06 PM 06/07/2021    4:07 PM  6CIT Screen  What Year? 0 points 0 points 0 points  What month? 0 points 0 points 0 points  What time? 0 points 0 points 0 points  Count back from 20 0 points 0 points 0 points  Months in reverse 0 points 0 points 0 points  Repeat phrase 0 points 0 points 0 points  Total Score 0 points 0 points 0 points    Immunizations Immunization History  Administered Date(s) Administered   Fluad Quad(high Dose 65+) 12/11/2018, 01/20/2020, 01/24/2021   Fluad Trivalent(High Dose 65+) 01/10/2023   Influenza, High Dose Seasonal PF 01/11/2016, 01/01/2017, 01/24/2018   Influenza-Unspecified 01/15/2014   Moderna SARS-COV2 Booster Vaccination 10/06/2020   Moderna Sars-Covid-2 Vaccination 05/13/2019, 06/10/2019   PFIZER Comirnaty(Gray Top)Covid-19 Tri-Sucrose Vaccine 01/17/2022   Pfizer Covid-19 Vaccine Bivalent Booster 72yrs & up 01/11/2021   Pfizer(Comirnaty)Fall Seasonal Vaccine 12 years and older 01/24/2023   Pneumococcal Polysaccharide-23 10/11/2015   Tdap 03/20/2016   Zoster Recombinant(Shingrix) 03/25/2020, 07/09/2020    Screening Tests Health Maintenance  Topic Date Due   Hepatitis C Screening  Never done   Pneumonia Vaccine 46+ Years old (2 of 2 - PCV) 10/10/2016   COVID-19 Vaccine (6 - 2024-25 season) 03/21/2023   Colonoscopy  02/17/2024   Medicare Annual Wellness (AWV)  06/10/2024   DTaP/Tdap/Td (2 - Td or Tdap) 03/20/2026   INFLUENZA VACCINE   Completed   DEXA SCAN  Completed   Zoster Vaccines- Shingrix  Completed   HPV VACCINES  Aged Out    Health Maintenance  Health Maintenance Due  Topic Date Due   Hepatitis C Screening  Never done   Pneumonia Vaccine 31+ Years old (2 of 2 - PCV) 10/10/2016   COVID-19 Vaccine (6 - 2024-25 season) 03/21/2023   Health Maintenance Items Addressed:06/11/2023 - pt plans to get Pneumonia vaccine at local pharmacy.   Additional Screening:  Vision Screening: Recommended annual ophthalmology exams for early detection of glaucoma and other disorders of the eye. Pt stated she has an appt. w/Dr Emily Filbert in 2025 for routine eye exam.  Dental Screening: Recommended annual dental exams for proper oral hygiene  Community Resource Referral / Chronic Care Management: CRR required this visit?  No   CCM required this visit?  No     Plan:     I have personally reviewed and noted the following in the patient's chart:   Medical and social history Use of alcohol, tobacco or illicit drugs  Current medications and supplements including opioid prescriptions. Patient is not currently taking opioid prescriptions. Functional ability and status Nutritional status Physical activity Advanced directives List of other physicians Hospitalizations, surgeries, and ER visits in previous 12 months Vitals Screenings to include cognitive, depression, and falls Referrals and appointments  In addition, I have reviewed and discussed with patient certain preventive protocols, quality metrics, and best practice recommendations. A written personalized care plan for preventive services as well as general preventive health recommendations were provided to patient.     Darreld Mclean, CMA   06/11/2023   After Visit Summary: (MyChart) Due to this being a telephonic visit, the after visit summary with patients personalized plan was offered to patient via MyChart   Notes:  Patient states will schedule her repeat  Colonoscopy for 02/2024 and repeat Mammogram for 06/2023.   Ordered an Audiology referral due to difficulty hearing.

## 2023-07-16 ENCOUNTER — Ambulatory Visit: Payer: Medicare Other | Attending: Internal Medicine | Admitting: Audiologist

## 2023-07-16 DIAGNOSIS — H903 Sensorineural hearing loss, bilateral: Secondary | ICD-10-CM | POA: Diagnosis not present

## 2023-07-16 NOTE — Procedures (Unsigned)
  Outpatient Audiology and [facility] [address] [place], [place]  [address] [phone]  AUDIOLOGICAL  EVALUATION  NAME: Dana Small     DOB:   12-11-1945      MRN: [id number]                                                                                     DATE: 07/16/2023     REFERENT: Myrlene Broker, MD STATUS: Outpatient DIAGNOSIS: Sensorineural Hearing Loss Bilateral    History: Jazell was seen for an audiological evaluation due to difficulty understanding people. Marlynn feels she hears but it can be hard to understand people if they do not talk clearly. She cannot hear in groups at church. She feels she hears well in quiet.  Jaydi denies pain, pressure, or tinnitus. Her mother had hearing loss.  Monserrat no significant history of hazardous noise exposure.  Medical history shows no additional risk for hearing loss.    Evaluation:  Otoscopy showed a clear view of the tympanic membranes, bilaterally Tympanometry results were consistent with normal middle ear function, bilaterally   Audiometric testing was completed using Conventional Audiometry techniques with insert earphones and supraural headphones. Test results are consistent with normal hearing 250-1kHz dropping to a severe top profound hearing loss 1.5-8kHz bilaterally. Speech Recognition Thresholds were obtained at 20 dB HL in the right ear and at 25  dB HL in the left ear. Word Recognition Testing was completed at  40dB SL and Daleena scored 80% in the right ear and 76% in the left ear.    Results:  The test results were reviewed with Randa Evens. She has a severe to profound sensorineural hearing loss 2-8kHz bilaterally. She needs hearing aids for both ears. She cannot hear high pitched consonants, which is why it seems other people are mumbling. Its actually that she cannot hear these sounds. Audiogram printed and provided to [place].    Recommendations: Hearing aids recommended for both ears.  Patient given list of local hearing aid providers.  Annual audiometric testing recommended to monitor hearing loss for progression.   32 minutes spent testing and counseling on results.   If you have any questions please feel free to contact me at [phone].  Ammie Ferrier Stalnaker Au.D.  Audiologist   07/16/2023  1:37 PM  Cc: Myrlene Broker, MD

## 2023-07-17 ENCOUNTER — Ambulatory Visit (INDEPENDENT_AMBULATORY_CARE_PROVIDER_SITE_OTHER): Admitting: Internal Medicine

## 2023-07-17 ENCOUNTER — Encounter: Payer: Self-pay | Admitting: Internal Medicine

## 2023-07-17 VITALS — BP 140/80 | HR 76 | Temp 98.3°F | Ht 65.0 in | Wt 168.0 lb

## 2023-07-17 DIAGNOSIS — E559 Vitamin D deficiency, unspecified: Secondary | ICD-10-CM

## 2023-07-17 DIAGNOSIS — I10 Essential (primary) hypertension: Secondary | ICD-10-CM

## 2023-07-17 DIAGNOSIS — R7303 Prediabetes: Secondary | ICD-10-CM | POA: Diagnosis not present

## 2023-07-17 DIAGNOSIS — E785 Hyperlipidemia, unspecified: Secondary | ICD-10-CM | POA: Diagnosis not present

## 2023-07-17 DIAGNOSIS — F4323 Adjustment disorder with mixed anxiety and depressed mood: Secondary | ICD-10-CM

## 2023-07-17 DIAGNOSIS — F4320 Adjustment disorder, unspecified: Secondary | ICD-10-CM | POA: Insufficient documentation

## 2023-07-17 LAB — COMPREHENSIVE METABOLIC PANEL WITH GFR
ALT: 24 U/L (ref 0–35)
AST: 25 U/L (ref 0–37)
Albumin: 4.3 g/dL (ref 3.5–5.2)
Alkaline Phosphatase: 76 U/L (ref 39–117)
BUN: 28 mg/dL — ABNORMAL HIGH (ref 6–23)
CO2: 32 meq/L (ref 19–32)
Calcium: 9.9 mg/dL (ref 8.4–10.5)
Chloride: 101 meq/L (ref 96–112)
Creatinine, Ser: 1.14 mg/dL (ref 0.40–1.20)
GFR: 46.45 mL/min — ABNORMAL LOW (ref >60.00)
Glucose, Bld: 103 mg/dL — ABNORMAL HIGH (ref 70–99)
Potassium: 5 meq/L (ref 3.5–5.1)
Sodium: 138 meq/L (ref 135–145)
Total Bilirubin: 0.6 mg/dL (ref 0.2–1.2)
Total Protein: 7.4 g/dL (ref 6.0–8.3)

## 2023-07-17 LAB — CBC
HCT: 42.5 % (ref 36.0–46.0)
Hemoglobin: 14.4 g/dL (ref 12.0–15.0)
MCHC: 33.8 g/dL (ref 30.0–36.0)
MCV: 95.6 fl (ref 78.0–100.0)
Platelets: 196 10*3/uL (ref 150.0–400.0)
RBC: 4.45 Mil/uL (ref 3.87–5.11)
RDW: 13.1 % (ref 11.5–15.5)
WBC: 7.1 10*3/uL (ref 4.0–10.5)

## 2023-07-17 LAB — LIPID PANEL
Cholesterol: 170 mg/dL (ref 0–200)
HDL: 58.5 mg/dL (ref >39.00)
LDL Cholesterol: 85 mg/dL (ref 0–99)
NonHDL: 111.1
Total CHOL/HDL Ratio: 3
Triglycerides: 133 mg/dL (ref 0.0–149.0)
VLDL: 26.6 mg/dL (ref 0.0–40.0)

## 2023-07-17 LAB — VITAMIN B12: Vitamin B-12: 1400 pg/mL — ABNORMAL HIGH (ref 211–911)

## 2023-07-17 LAB — HEMOGLOBIN A1C: Hgb A1c MFr Bld: 5.9 % (ref 4.6–6.5)

## 2023-07-17 LAB — VITAMIN D 25 HYDROXY (VIT D DEFICIENCY, FRACTURES): VITD: 60.96 ng/mL (ref 30.00–100.00)

## 2023-07-17 LAB — TSH: TSH: 2.16 u[IU]/mL (ref 0.35–5.50)

## 2023-07-17 MED ORDER — BUPROPION HCL ER (XL) 150 MG PO TB24: 150.0000 mg | ORAL_TABLET | Freq: Every day | ORAL | 1 refills | Status: DC

## 2023-07-17 NOTE — Assessment & Plan Note (Signed)
 Checking lipid panel and adjust pravastatin 20 mg daily as needed.

## 2023-07-17 NOTE — Patient Instructions (Signed)
 We will try wellbutrin to help with weight and anxiety.

## 2023-07-17 NOTE — Assessment & Plan Note (Signed)
 Checking Hga1c and adjust as needed.

## 2023-07-17 NOTE — Assessment & Plan Note (Signed)
 Rx wellbutrin 150 mg daily to help with this and food cravings. Husband with alzheimer's which is stressful to her.

## 2023-07-17 NOTE — Assessment & Plan Note (Signed)
 BP at goal on irbesartan 150 mg daily and checking CMP. Adjust as needed.

## 2023-07-17 NOTE — Progress Notes (Signed)
   Subjective:   Patient ID: Dana Small, female    DOB: [birth year], 78 y.o.   MRN: [id number]  HPI The patient is a 78 YO female coming in for follow up health conditions and bruising on arms more recently. Also having more anxiety husband with dementia.  Review of Systems  Constitutional: Negative.   HENT: Negative.    Eyes: Negative.   Respiratory:  Negative for cough, chest tightness and shortness of breath.   Cardiovascular:  Negative for chest pain, palpitations and leg swelling.  Gastrointestinal:  Negative for abdominal distention, abdominal pain, constipation, diarrhea, nausea and vomiting.  Musculoskeletal: Negative.   Skin: Negative.   Neurological: Negative.   Hematological:  Bruises/bleeds easily.  Psychiatric/Behavioral: Negative.      Objective:  Physical Exam Constitutional:      Appearance: She is well-developed.  HENT:     Head: Normocephalic and atraumatic.  Cardiovascular:     Rate and Rhythm: Normal rate and regular rhythm.  Pulmonary:     Effort: Pulmonary effort is normal. No respiratory distress.     Breath sounds: Normal breath sounds. No wheezing or rales.  Abdominal:     General: Bowel sounds are normal. There is no distension.     Palpations: Abdomen is soft.     Tenderness: There is no abdominal tenderness. There is no rebound.  Musculoskeletal:     Cervical back: Normal range of motion.  Skin:    General: Skin is warm and dry.  Neurological:     Mental Status: She is alert and oriented to person, place, and time.     Coordination: Coordination normal.     Vitals:   07/17/23 1418  BP: (!) 140/80  Pulse: 76  Temp: 98.3 F (36.8 C)  TempSrc: Oral  SpO2: 91%  Weight: 168 lb (76.2 kg)  Height: 5\' 5"  (1.651 m)    Assessment & Plan:

## 2023-07-18 ENCOUNTER — Encounter: Payer: Self-pay | Admitting: Internal Medicine

## 2023-07-23 ENCOUNTER — Encounter: Payer: Self-pay | Admitting: Internal Medicine

## 2023-07-24 ENCOUNTER — Ambulatory Visit (INDEPENDENT_AMBULATORY_CARE_PROVIDER_SITE_OTHER): Admitting: Emergency Medicine

## 2023-07-24 ENCOUNTER — Encounter: Payer: Self-pay | Admitting: Emergency Medicine

## 2023-07-24 VITALS — BP 152/78 | HR 61 | Temp 98.2°F | Ht 65.0 in | Wt 172.0 lb

## 2023-07-24 DIAGNOSIS — D698 Other specified hemorrhagic conditions: Secondary | ICD-10-CM | POA: Diagnosis not present

## 2023-07-24 DIAGNOSIS — F4323 Adjustment disorder with mixed anxiety and depressed mood: Secondary | ICD-10-CM

## 2023-07-24 DIAGNOSIS — I10 Essential (primary) hypertension: Secondary | ICD-10-CM

## 2023-07-24 DIAGNOSIS — N1831 Chronic kidney disease, stage 3a: Secondary | ICD-10-CM | POA: Diagnosis not present

## 2023-07-24 DIAGNOSIS — E785 Hyperlipidemia, unspecified: Secondary | ICD-10-CM

## 2023-07-24 NOTE — Assessment & Plan Note (Signed)
 Clinically stable. Normal CBC. Low GFR may be related

## 2023-07-24 NOTE — Assessment & Plan Note (Signed)
 Normal recent lipid profile On pravastatin 20 mg daily

## 2023-07-24 NOTE — Assessment & Plan Note (Signed)
 New recent finding. Recommend to repeat it in 3 to 4 weeks Advised to stay well-hydrated and avoid NSAIDs

## 2023-07-24 NOTE — Assessment & Plan Note (Signed)
 Has not started Wellbutrin 150 mg yet

## 2023-07-24 NOTE — Progress Notes (Signed)
 Dana Small 78 y.o.   Chief Complaint  Patient presents with   Results    Patient states she came in 2 weeks ago to see Dr. Okey Dupre for her bruising on her arm. She said Okey Dupre did not acknowledge the bruising. She also had questions about the burpropion. Inquiring about Zepbound in pill form.       HISTORY OF PRESENT ILLNESS: This is a 78 y.o. female here for follow-up of visit with Dr. Okey Dupre 2 weeks ago.  Wants to go over blood results. Was concerned about easy bruising on her arms Also has some questions about Wellbutrin and Zepbound.  Capillary fragility capillary fragility Extensive blood work showed increased BUN and decreased GFR.  Rest of labs within normal limits.  HPI   Prior to Admission medications   Medication Sig Start Date End Date Taking? Authorizing Provider  buPROPion (WELLBUTRIN XL) 150 MG 24 hr tablet Take 1 tablet (150 mg total) by mouth daily. 07/17/23  Yes Myrlene Broker, MD  clobetasol (TEMOVATE) 0.05 % external solution Apply 1 Application topically 2 (two) times daily. Apply to scalp 2x daily until cleared and/or PRN 09/05/22  Yes Terri Piedra, DO  clotrimazole-betamethasone (LOTRISONE) cream Apply 1 Application topically 2 (two) times daily. 01/24/22  Yes Issa Kosmicki, Eilleen Kempf, MD  irbesartan (AVAPRO) 150 MG tablet TAKE 1 TABLET(150 MG) BY MOUTH DAILY 12/19/22  Yes Myrlene Broker, MD  pravastatin (PRAVACHOL) 20 MG tablet TAKE 1 TABLET(20 MG) BY MOUTH DAILY 05/04/23  Yes Myrlene Broker, MD  triamcinolone cream (KENALOG) 0.1 % Apply 1 Application topically 2 (two) times daily as needed. For itch, apply for 2 weeks then stop, Repeat as needed 09/05/22  Yes Terri Piedra, DO    No Known Allergies  Patient Active Problem List   Diagnosis Date Noted   Adjustment disorder 07/17/2023   Hyperlipidemia with target LDL less than 100 06/14/2020   Sensorineural hearing loss (SNHL) of both ears 05/26/2020   BPPV (benign paroxysmal  positional vertigo), unspecified laterality 05/26/2020   Pre-diabetes 10/14/2019   Essential hypertension 06/18/2014   Osteoarthritis 06/18/2014   Routine general medical examination at a health care facility 06/18/2014    Past Medical History:  Diagnosis Date   Arthritis    Hypertension     Past Surgical History:  Procedure Laterality Date   COLONOSCOPY  06/11/2013   [facility]   TOE SURGERY     tooth implant      Social History   Socioeconomic History   Marital status: Married    Spouse name: Not on file   Number of children: Not on file   Years of education: Not on file   Highest education level: Associate degree: academic program  Occupational History   Not on file  Tobacco Use   Smoking status: Never   Smokeless tobacco: Never  Vaping Use   Vaping status: Never Used  Substance and Sexual Activity   Alcohol use: Yes   Drug use: Never   Sexual activity: Not on file  Other Topics Concern   Not on file  Social History Narrative   Not on file   Social Drivers of Health   Financial Resource Strain: Low Risk  (06/11/2023)   Overall Financial Resource Strain (CARDIA)    Difficulty of Paying Living Expenses: Not hard at all  Food Insecurity: No Food Insecurity (06/11/2023)   Hunger Vital Sign    Worried About Running Out of Food in the Last Year: Never  true    Ran Out of Food in the Last Year: Never true  Transportation Needs: No Transportation Needs (06/11/2023)   PRAPARE - Administrator, Civil Service (Medical): No    Lack of Transportation (Non-Medical): No  Physical Activity: Sufficiently Active (06/11/2023)   Exercise Vital Sign    Days of Exercise per Week: 5 days    Minutes of Exercise per Session: 50 min  Stress: No Stress Concern Present (06/11/2023)   [employer] of Occupational Health - Occupational Stress Questionnaire    Feeling of Stress : Not at all  Social Connections: Moderately Integrated (06/11/2023)   Social  Connection and Isolation Panel [NHANES]    Frequency of Communication with Friends and Family: More than three times a week    Frequency of Social Gatherings with Friends and Family: Three times a week    Attends Religious Services: 1 to 4 times per year    Active Member of Clubs or Organizations: No    Attends Banker Meetings: Never    Marital Status: Married  Catering manager Violence: Not At Risk (06/11/2023)   Humiliation, Afraid, Rape, and Kick questionnaire    Fear of Current or Ex-Partner: No    Emotionally Abused: No    Physically Abused: No    Sexually Abused: No    Family History  Problem Relation Age of Onset   Heart disease Other        Parents   Hypertension Other        Parents   Diverticulitis Father    Breast cancer Neg Hx    Colon cancer Neg Hx    Esophageal cancer Neg Hx    Cancer Neg Hx    Stomach cancer Neg Hx    Rectal cancer Neg Hx      Review of Systems  Constitutional: Negative.  Negative for chills and fever.  HENT: Negative.  Negative for congestion and sore throat.   Respiratory: Negative.  Negative for cough and shortness of breath.   Cardiovascular: Negative.  Negative for chest pain.  Gastrointestinal:  Negative for nausea and vomiting.  Genitourinary: Negative.  Negative for dysuria and hematuria.  Skin:  Negative for rash.  Neurological: Negative.  Negative for dizziness and headaches.  All other systems reviewed and are negative.   Vitals:   07/24/23 1414  BP: (!) 152/78  Pulse: 61  Temp: 98.2 F (36.8 C)  SpO2: 97%    Physical Exam Vitals reviewed.  Constitutional:      Appearance: Normal appearance.  HENT:     Head: Normocephalic.  Eyes:     Extraocular Movements: Extraocular movements intact.  Cardiovascular:     Rate and Rhythm: Normal rate.  Pulmonary:     Effort: Pulmonary effort is normal.  Skin:    General: Skin is warm and dry.     Comments: Small areas of capillary leakage seen on left forearm   Neurological:     Mental Status: She is alert and oriented to person, place, and time.  Psychiatric:        Mood and Affect: Mood normal.        Behavior: Behavior normal.      ASSESSMENT & PLAN: A total of 42 minutes was spent with the patient and counseling/coordination of care regarding preparing for this visit, review of most recent office visit notes, review of multiple chronic medical conditions under management, review of all medications, review of most recent blood work results, education on  nutrition, prognosis, documentation and need for follow-up with PCP.  Problem List Items Addressed This Visit       Cardiovascular and Mediastinum   Essential hypertension   BP Readings from Last 3 Encounters:  07/24/23 (!) 152/78  07/17/23 (!) 140/80  06/11/23 130/72  Elevated blood pressure reading in the office but normal readings at home. Continue Avapro 150 mg daily       Capillary fragility (HCC)   Clinically stable. Normal CBC. Low GFR may be related        Genitourinary   Stage 3a chronic kidney disease (HCC) - Primary   New recent finding. Recommend to repeat it in 3 to 4 weeks Advised to stay well-hydrated and avoid NSAIDs         Other   Hyperlipidemia with target LDL less than 100   Normal recent lipid profile On pravastatin 20 mg daily      Adjustment disorder   Has not started Wellbutrin 150 mg yet      Patient Instructions  Health Maintenance After Age 87 After age 12, you are at a higher risk for certain long-term diseases and infections as well as injuries from falls. Falls are a major cause of broken bones and head injuries in people who are older than age 64. Getting regular preventive care can help to keep you healthy and well. Preventive care includes getting regular testing and making lifestyle changes as recommended by your health care provider. Talk with your health care provider about: Which screenings and tests you should have. A  screening is a test that checks for a disease when you have no symptoms. A diet and exercise plan that is right for you. What should I know about screenings and tests to prevent falls? Screening and testing are the best ways to find a health problem early. Early diagnosis and treatment give you the best chance of managing medical conditions that are common after age 44. Certain conditions and lifestyle choices may make you more likely to have a fall. Your health care provider may recommend: Regular vision checks. Poor vision and conditions such as cataracts can make you more likely to have a fall. If you wear glasses, make sure to get your prescription updated if your vision changes. Medicine review. Work with your health care provider to regularly review all of the medicines you are taking, including over-the-counter medicines. Ask your health care provider about any side effects that may make you more likely to have a fall. Tell your health care provider if any medicines that you take make you feel dizzy or sleepy. Strength and balance checks. Your health care provider may recommend certain tests to check your strength and balance while standing, walking, or changing positions. Foot health exam. Foot pain and numbness, as well as not wearing proper footwear, can make you more likely to have a fall. Screenings, including: Osteoporosis screening. Osteoporosis is a condition that causes the bones to get weaker and break more easily. Blood pressure screening. Blood pressure changes and medicines to control blood pressure can make you feel dizzy. Depression screening. You may be more likely to have a fall if you have a fear of falling, feel depressed, or feel unable to do activities that you used to do. Alcohol use screening. Using too much alcohol can affect your balance and may make you more likely to have a fall. Follow these instructions at home: Lifestyle Do not drink alcohol if: Your health care  provider tells you not  to drink. If you drink alcohol: Limit how much you have to: 0-1 drink a day for women. 0-2 drinks a day for men. Know how much alcohol is in your drink. In the [place] one drink equals one 12 oz bottle of beer (355 mL), one 5 oz glass of wine (148 mL), or one 1 oz glass of hard liquor (44 mL). Do not use any products that contain nicotine or tobacco. These products include cigarettes, chewing tobacco, and vaping devices, such as e-cigarettes. If you need help quitting, ask your health care provider. Activity  Follow a regular exercise program to stay fit. This will help you maintain your balance. Ask your health care provider what types of exercise are appropriate for you. If you need a cane or walker, use it as recommended by your health care provider. Wear supportive shoes that have nonskid soles. Safety  Remove any tripping hazards, such as rugs, cords, and clutter. Install safety equipment such as grab bars in bathrooms and safety rails on stairs. Keep rooms and walkways well-lit. General instructions Talk with your health care provider about your risks for falling. Tell your health care provider if: You fall. Be sure to tell your health care provider about all falls, even ones that seem minor. You feel dizzy, tiredness (fatigue), or off-balance. Take over-the-counter and prescription medicines only as told by your health care provider. These include supplements. Eat a healthy diet and maintain a healthy weight. A healthy diet includes low-fat dairy products, low-fat (lean) meats, and fiber from whole grains, beans, and lots of fruits and vegetables. Stay current with your vaccines. Schedule regular health, dental, and eye exams. Summary Having a healthy lifestyle and getting preventive care can help to protect your health and wellness after age 40. Screening and testing are the best way to find a health problem early and help you avoid having a fall. Early  diagnosis and treatment give you the best chance for managing medical conditions that are more common for people who are older than age 36. Falls are a major cause of broken bones and head injuries in people who are older than age 73. Take precautions to prevent a fall at home. Work with your health care provider to learn what changes you can make to improve your health and wellness and to prevent falls. This information is not intended to replace advice given to you by your health care provider. Make sure you discuss any questions you have with your health care provider. Document Revised: 08/23/2020 Document Reviewed: 08/23/2020 Elsevier Patient Education  2024 [employer] Inc.     Edwina Barth, MD [facility]

## 2023-07-24 NOTE — Assessment & Plan Note (Signed)
 BP Readings from Last 3 Encounters:  07/24/23 (!) 152/78  07/17/23 (!) 140/80  06/11/23 130/72  Elevated blood pressure reading in the office but normal readings at home. Continue Avapro 150 mg daily

## 2023-07-24 NOTE — Patient Instructions (Signed)
 Health Maintenance After Age 79 After age 4, you are at a higher risk for certain long-term diseases and infections as well as injuries from falls. Falls are a major cause of broken bones and head injuries in people who are older than age 47. Getting regular preventive care can help to keep you healthy and well. Preventive care includes getting regular testing and making lifestyle changes as recommended by your health care provider. Talk with your health care provider about: Which screenings and tests you should have. A screening is a test that checks for a disease when you have no symptoms. A diet and exercise plan that is right for you. What should I know about screenings and tests to prevent falls? Screening and testing are the best ways to find a health problem early. Early diagnosis and treatment give you the best chance of managing medical conditions that are common after age 37. Certain conditions and lifestyle choices may make you more likely to have a fall. Your health care provider may recommend: Regular vision checks. Poor vision and conditions such as cataracts can make you more likely to have a fall. If you wear glasses, make sure to get your prescription updated if your vision changes. Medicine review. Work with your health care provider to regularly review all of the medicines you are taking, including over-the-counter medicines. Ask your health care provider about any side effects that may make you more likely to have a fall. Tell your health care provider if any medicines that you take make you feel dizzy or sleepy. Strength and balance checks. Your health care provider may recommend certain tests to check your strength and balance while standing, walking, or changing positions. Foot health exam. Foot pain and numbness, as well as not wearing proper footwear, can make you more likely to have a fall. Screenings, including: Osteoporosis screening. Osteoporosis is a condition that causes  the bones to get weaker and break more easily. Blood pressure screening. Blood pressure changes and medicines to control blood pressure can make you feel dizzy. Depression screening. You may be more likely to have a fall if you have a fear of falling, feel depressed, or feel unable to do activities that you used to do. Alcohol use screening. Using too much alcohol can affect your balance and may make you more likely to have a fall. Follow these instructions at home: Lifestyle Do not drink alcohol if: Your health care provider tells you not to drink. If you drink alcohol: Limit how much you have to: 0-1 drink a day for women. 0-2 drinks a day for men. Know how much alcohol is in your drink. In the [place] one drink equals one 12 oz bottle of beer (355 mL), one 5 oz glass of wine (148 mL), or one 1 oz glass of hard liquor (44 mL). Do not use any products that contain nicotine or tobacco. These products include cigarettes, chewing tobacco, and vaping devices, such as e-cigarettes. If you need help quitting, ask your health care provider. Activity  Follow a regular exercise program to stay fit. This will help you maintain your balance. Ask your health care provider what types of exercise are appropriate for you. If you need a cane or walker, use it as recommended by your health care provider. Wear supportive shoes that have nonskid soles. Safety  Remove any tripping hazards, such as rugs, cords, and clutter. Install safety equipment such as grab bars in bathrooms and safety rails on stairs. Keep rooms and walkways  well-lit. General instructions Talk with your health care provider about your risks for falling. Tell your health care provider if: You fall. Be sure to tell your health care provider about all falls, even ones that seem minor. You feel dizzy, tiredness (fatigue), or off-balance. Take over-the-counter and prescription medicines only as told by your health care provider. These include  supplements. Eat a healthy diet and maintain a healthy weight. A healthy diet includes low-fat dairy products, low-fat (lean) meats, and fiber from whole grains, beans, and lots of fruits and vegetables. Stay current with your vaccines. Schedule regular health, dental, and eye exams. Summary Having a healthy lifestyle and getting preventive care can help to protect your health and wellness after age 11. Screening and testing are the best way to find a health problem early and help you avoid having a fall. Early diagnosis and treatment give you the best chance for managing medical conditions that are more common for people who are older than age 28. Falls are a major cause of broken bones and head injuries in people who are older than age 48. Take precautions to prevent a fall at home. Work with your health care provider to learn what changes you can make to improve your health and wellness and to prevent falls. This information is not intended to replace advice given to you by your health care provider. Make sure you discuss any questions you have with your health care provider. Document Revised: 08/23/2020 Document Reviewed: 08/23/2020 Elsevier Patient Education  2024 [employer].

## 2023-07-26 NOTE — Telephone Encounter (Signed)
 Schedule OV for patient?

## 2023-08-09 ENCOUNTER — Other Ambulatory Visit: Payer: Self-pay | Admitting: Internal Medicine

## 2023-08-09 DIAGNOSIS — Z1231 Encounter for screening mammogram for malignant neoplasm of breast: Secondary | ICD-10-CM

## 2023-08-16 ENCOUNTER — Ambulatory Visit
Admission: RE | Admit: 2023-08-16 | Discharge: 2023-08-16 | Disposition: A | Discharge status: Home / Self Care | Source: Ambulatory Visit | Attending: Internal Medicine | Admitting: Internal Medicine

## 2023-08-16 DIAGNOSIS — Z1231 Encounter for screening mammogram for malignant neoplasm of breast: Secondary | ICD-10-CM

## 2023-08-21 DIAGNOSIS — H2513 Age-related nuclear cataract, bilateral: Secondary | ICD-10-CM | POA: Diagnosis not present

## 2023-08-21 DIAGNOSIS — H5213 Myopia, bilateral: Secondary | ICD-10-CM | POA: Diagnosis not present

## 2023-08-21 DIAGNOSIS — H43813 Vitreous degeneration, bilateral: Secondary | ICD-10-CM | POA: Diagnosis not present

## 2023-09-04 ENCOUNTER — Encounter: Payer: Self-pay | Admitting: Family Medicine

## 2023-09-04 DIAGNOSIS — G8929 Other chronic pain: Secondary | ICD-10-CM

## 2023-09-04 NOTE — Telephone Encounter (Signed)
 Forwarding to Dr. Alease Hunter to review and advise.   I do not see that referral has been placed to Ortho Surg.  Last OV 03/21/23  Assessment and Plan: 78 y.o. female with left shoulder pain.  This is a chronic problem that has not improved sufficiently with conservative management.  The 2 sources of pain today are likely to be the subacromial space due to the tendinitis and impingement seen in the supraspinatus tendon and possibly the glenohumeral joint due to the osteochondral degeneration and bone bruising.  The location of her current pain is most consistent with subacromial and we will proceed with a subacromial injection today.  If this is not sufficient would recommend retrial of physical therapy and potentially a surgical consultation to talk about her options.  We could potentially try glenohumeral injection as well.  She will keep me updated.   Discussed possibility of hyperglycemia with injection today with the setting of her prediabetes.  Should be low risk.

## 2023-09-26 ENCOUNTER — Ambulatory Visit (INDEPENDENT_AMBULATORY_CARE_PROVIDER_SITE_OTHER): Admitting: Orthopaedic Surgery

## 2023-09-26 DIAGNOSIS — M7542 Impingement syndrome of left shoulder: Secondary | ICD-10-CM | POA: Diagnosis not present

## 2023-09-26 NOTE — Progress Notes (Signed)
 Chief Complaint: Left shoulder pain     History of Present Illness:    Dana Small is a 78 y.o. female presents today as a referral from Dr. Alease Hunter for ongoing left shoulder pain.  She is also here for MRI discussion.  She states that she is having pain with overhead activity as well as pulling type maneuvers in the front of the shoulder and over the biceps.  She is a member here at [employer] and has noticed difficulty in her aquatics class particular with pulling and pushing type activities.  She has had 2 injections which did give her significant relief for several months.  This is subsequently worn off.  She has been undergoing physical therapy for the last several months without any relief.  She does enjoy being active and has had time which she has enjoyed with her son who has back from an Engineer, agricultural.  She does have grandchildren as well    PMH/PSH/Family History/Social History/Meds/Allergies:    Past Medical History:  Diagnosis Date   Arthritis    Hypertension    Past Surgical History:  Procedure Laterality Date   COLONOSCOPY  06/11/2013   [facility]   TOE SURGERY     tooth implant     Social History   Socioeconomic History   Marital status: Married    Spouse name: Not on file   Number of children: Not on file   Years of education: Not on file   Highest education level: Associate degree: academic program  Occupational History   Not on file  Tobacco Use   Smoking status: Never   Smokeless tobacco: Never  Vaping Use   Vaping status: Never Used  Substance and Sexual Activity   Alcohol use: Yes   Drug use: Never   Sexual activity: Not on file  Other Topics Concern   Not on file  Social History Narrative   Not on file   Social Drivers of Health   Financial Resource Strain: Low Risk  (06/11/2023)   Overall Financial Resource Strain (CARDIA)    Difficulty of Paying Living Expenses: Not hard at all  Food Insecurity: No Food Insecurity  (06/11/2023)   Hunger Vital Sign    Worried About Running Out of Food in the Last Year: Never true    Ran Out of Food in the Last Year: Never true  Transportation Needs: No Transportation Needs (06/11/2023)   PRAPARE - Administrator, Civil Service (Medical): No    Lack of Transportation (Non-Medical): No  Physical Activity: Sufficiently Active (06/11/2023)   Exercise Vital Sign    Days of Exercise per Week: 5 days    Minutes of Exercise per Session: 50 min  Stress: No Stress Concern Present (06/11/2023)   [employer] of Occupational Health - Occupational Stress Questionnaire    Feeling of Stress : Not at all  Social Connections: Moderately Integrated (06/11/2023)   Social Connection and Isolation Panel [NHANES]    Frequency of Communication with Friends and Family: More than three times a week    Frequency of Social Gatherings with Friends and Family: Three times a week    Attends Religious Services: 1 to 4 times per year    Active Member of Clubs or Organizations: No    Attends Banker Meetings: Never    Marital Status: Married   Family History  Problem Relation Age of Onset   Heart disease Other  Parents   Hypertension Other        Parents   Diverticulitis Father    Breast cancer Neg Hx    Colon cancer Neg Hx    Esophageal cancer Neg Hx    Cancer Neg Hx    Stomach cancer Neg Hx    Rectal cancer Neg Hx    No Known Allergies Current Outpatient Medications  Medication Sig Dispense Refill   buPROPion  (WELLBUTRIN  XL) 150 MG 24 hr tablet Take 1 tablet (150 mg total) by mouth daily. 90 tablet 1   clobetasol  (TEMOVATE ) 0.05 % external solution Apply 1 Application topically 2 (two) times daily. Apply to scalp 2x daily until cleared and/or PRN 50 mL 0   clotrimazole -betamethasone  (LOTRISONE ) cream Apply 1 Application topically 2 (two) times daily. 30 g 1   irbesartan  (AVAPRO ) 150 MG tablet TAKE 1 TABLET(150 MG) BY MOUTH DAILY 90 tablet 3    pravastatin  (PRAVACHOL ) 20 MG tablet TAKE 1 TABLET(20 MG) BY MOUTH DAILY 90 tablet 3   triamcinolone  cream (KENALOG ) 0.1 % Apply 1 Application topically 2 (two) times daily as needed. For itch, apply for 2 weeks then stop, Repeat as needed 240 g 11   No current facility-administered medications for this visit.   No results found.  Review of Systems:   A ROS was performed including pertinent positives and negatives as documented in the HPI.  Physical Exam :   Constitutional: NAD and appears stated age Neurological: Alert and oriented Psych: Appropriate affect and cooperative There were no vitals taken for this visit.   Comprehensive Musculoskeletal Exam:    Left shoulder tenderness over the anterior superior shoulder and greater tuberosity.  Positive biceps pain with Speed maneuver.  Active forward flexion is to 150 degrees compared to 165 on the right.  External rotation is to 60 degrees bilaterally.  Internal rotation is to L1 bilaterally.  Positive Neer impingement   Imaging:   Xray (3 views left shoulder): Normal  MRI (left shoulder): Tearing intrasubstance of the left biceps as well as significant AC spur consistent with subacromial bursitis and impingement   I personally reviewed and interpreted the radiographs.   Assessment and Plan:   78 y.o. female with evidence of left shoulder subacromial bursitis and impingement in the setting of AC arthropathy and biceps tearing.  Overall I did discuss that given the fact that she has now had multiple injections as well as physical therapy I do believe she would be candidate for arthroscopy with subacromial decompression distal clavicle excision and biceps tenodesis.  I did discuss risk limitations associated with this as well as the associated recovery timeframe.  After discussion of this she would possibly like to proceed.  -Missed a message should she want to proceed with the left shoulder arthroscopy with subacromial debridement,  distal clavicle excision and biceps tenodesis    After a lengthy discussion of treatment options, including risks, benefits, alternatives, complications of surgical and nonsurgical conservative options, the patient elected surgical repair.   The patient  is aware of the material risks  and complications including, but not limited to injury to adjacent structures, neurovascular injury, infection, numbness, bleeding, implant failure, thermal burns, stiffness, persistent pain, failure to heal, disease transmission from allograft, need for further surgery, dislocation, anesthetic risks, blood clots, risks of death,and others. The probabilities of surgical success and failure discussed with patient given their particular co-morbidities.The time and nature of expected rehabilitation and recovery was discussed.The patient's questions were all answered preoperatively.  No barriers  to understanding were noted. I explained the natural history of the disease process and Rx rationale.  I explained to the patient what I considered to be reasonable expectations given their personal situation.  The final treatment plan was arrived at through a shared patient decision making process model.    I personally saw and evaluated the patient, and participated in the management and treatment plan.  Wilhelmenia Harada, MD Attending Physician, Orthopedic Surgery  This document was dictated using Dragon voice recognition software. A reasonable attempt at proof reading has been made to minimize errors.

## 2023-10-30 ENCOUNTER — Encounter: Payer: Self-pay | Admitting: Internal Medicine

## 2023-10-31 NOTE — Telephone Encounter (Signed)
 Schedule patient

## 2023-11-02 ENCOUNTER — Telehealth: Payer: Self-pay

## 2023-11-02 NOTE — Telephone Encounter (Signed)
 Called patient and Lvm for patient to come in and have vists need to be scheduled for surgical clarence

## 2023-11-02 NOTE — Telephone Encounter (Unsigned)
 Copied from CRM [id number]. Topic: General - Other >> Nov 02, 2023 11:57 AM Chasity T wrote: Reason for CRM: patient returning call from [place]. Please return patient call

## 2023-11-03 ENCOUNTER — Encounter (HOSPITAL_BASED_OUTPATIENT_CLINIC_OR_DEPARTMENT_OTHER): Payer: Self-pay | Admitting: Orthopaedic Surgery

## 2023-11-20 ENCOUNTER — Ambulatory Visit: Admitting: Internal Medicine

## 2023-11-20 ENCOUNTER — Encounter: Payer: Self-pay | Admitting: Internal Medicine

## 2023-11-20 VITALS — BP 142/80 | HR 64 | Temp 97.9°F | Ht 65.0 in | Wt 174.0 lb

## 2023-11-20 DIAGNOSIS — N1831 Chronic kidney disease, stage 3a: Secondary | ICD-10-CM

## 2023-11-20 DIAGNOSIS — I10 Essential (primary) hypertension: Secondary | ICD-10-CM | POA: Diagnosis not present

## 2023-11-20 DIAGNOSIS — Z01810 Encounter for preprocedural cardiovascular examination: Secondary | ICD-10-CM

## 2023-11-20 DIAGNOSIS — I452 Bifascicular block: Secondary | ICD-10-CM

## 2023-11-20 DIAGNOSIS — R7303 Prediabetes: Secondary | ICD-10-CM

## 2023-11-20 NOTE — Progress Notes (Signed)
 "  Subjective:   Patient ID: Dana Small, female    DOB: 18-Feb-1946, 78 y.o.   MRN: [id number]  Discussed the use of AI scribe software for clinical note transcription with the patient, who gave verbal consent to proceed.  History of Present Illness Dana Small is a 78 year old female who presents for preoperative clearance for shoulder surgery.  She has a history of high blood pressure, currently managed with irbesartan  150 mg daily.   She recently stopped taking bupropion  after experiencing episodes of palpitations that persisted for two consecutive days. She describes the episodes as 'scary' and notes that they did not resolve with her usual method of holding her breath. The palpitations ceased after she discontinued the medication.  She is scheduled for surgery due to a bone spur in her shoulder, which has been causing significant discomfort for the past year. She reports an inability to raise her arm fully, stating 'I can't raise it farther than that'. She reports that an MRI was performed and that she was told it showed a bone spur and inflammation.   No chest pain or breathing difficulties. She engages in regular physical activity, including water aerobics, and reports no issues during these exercises.  EKG: Rate 61, axis normal, interval LAFB and RBBB, sinus, no st or t wave changes, no prior to compare  Review of Systems  Constitutional: Negative.   HENT: Negative.    Eyes: Negative.   Respiratory:  Negative for cough, chest tightness and shortness of breath.   Cardiovascular:  Negative for chest pain, palpitations and leg swelling.  Gastrointestinal:  Negative for abdominal distention, abdominal pain, constipation, diarrhea, nausea and vomiting.  Endocrine: Negative.   Musculoskeletal:  Positive for arthralgias.  Skin: Negative.   Neurological: Negative.   Psychiatric/Behavioral: Negative.      Objective:  Physical Exam Constitutional:      Appearance: She is  well-developed.  HENT:     Head: Normocephalic and atraumatic.  Cardiovascular:     Rate and Rhythm: Normal rate and regular rhythm.  Pulmonary:     Effort: Pulmonary effort is normal. No respiratory distress.     Breath sounds: Normal breath sounds. No wheezing or rales.  Abdominal:     General: Bowel sounds are normal. There is no distension.     Palpations: Abdomen is soft.     Tenderness: There is no abdominal tenderness. There is no rebound.  Musculoskeletal:        General: Tenderness present.     Cervical back: Normal range of motion.     Comments: Left shoulder with limited ROM and pain  Skin:    General: Skin is warm and dry.  Neurological:     Mental Status: She is alert and oriented to person, place, and time.     Coordination: Coordination normal.     Vitals:   11/20/23 1447 11/20/23 1455  BP: (!) 142/80 (!) 142/80  Pulse: 64   Temp: 97.9 F (36.6 C)   TempSrc: Oral   SpO2: 99%   Weight: 174 lb (78.9 kg)   Height: 5' 5 (1.651 m)     Assessment and Plan Assessment & Plan Preoperative evaluation for right shoulder bone spur with limited range of motion   MRI confirms a right shoulder bone spur causing limited range of motion. Surgery is considered to improve functionality. She is informed about the use of a nerve block for postoperative pain and the risk of opioid-related constipation. Advise on potential  constipation from opioid use and recommend having a stool softener available.  Hypertension   Blood pressure is well-managed at 140/80 mmHg with irbesartan  150 mg. Increasing the dose is unnecessary due to the risk of hypotension. Continue irbesartan  150 mg daily and monitor blood pressure regularly to ensure it remains within the target range.  Palpitations, likely medication-induced   Palpitations were likely due to bupropion  accumulation causing tachycardia, which resolved after discontinuation. Avoid bupropion  due to previous palpitations and monitor for  any recurrence.  Right bundle branch block and left anterior fascicular block (asymptomatic)   EKG shows asymptomatic right bundle branch block and left anterior fascicular block, not impacting cardiac function. No further cardiac workup is necessary unless symptoms develop.   "

## 2023-11-21 DIAGNOSIS — Z6829 Body mass index (BMI) 29.0-29.9, adult: Secondary | ICD-10-CM | POA: Insufficient documentation

## 2023-11-22 DIAGNOSIS — Z01810 Encounter for preprocedural cardiovascular examination: Secondary | ICD-10-CM | POA: Insufficient documentation

## 2023-11-22 NOTE — Assessment & Plan Note (Signed)
 EKG shows asymptomatic right bundle branch block and left anterior fascicular block, not impacting cardiac function. No further cardiac workup is necessary unless symptoms develop.

## 2023-11-22 NOTE — Assessment & Plan Note (Signed)
 BP at goal on irbesartan  and will keep dose same.

## 2023-11-22 NOTE — Assessment & Plan Note (Signed)
 EKG done and no ischemic changes. Noted RBBB and LAFB not impacting pre-op. No symptoms and good METS. Counseled about potential constipation from opioids post-op. Low risk cleared for surgery.

## 2023-11-22 NOTE — Assessment & Plan Note (Signed)
 Stable on last labs. BP at goal.

## 2023-11-22 NOTE — Assessment & Plan Note (Signed)
 Stable and no change needed. HgA1c up to date.

## 2023-11-28 ENCOUNTER — Other Ambulatory Visit: Payer: Self-pay | Admitting: Internal Medicine

## 2023-11-28 DIAGNOSIS — I10 Essential (primary) hypertension: Secondary | ICD-10-CM

## 2023-12-03 DIAGNOSIS — M25512 Pain in left shoulder: Secondary | ICD-10-CM | POA: Insufficient documentation

## 2023-12-04 ENCOUNTER — Telehealth: Payer: Self-pay

## 2023-12-04 NOTE — Telephone Encounter (Signed)
 Pr op forms were signed on 11/20/2023 and fax was sent successfully on 11/28/2023.

## 2023-12-06 DIAGNOSIS — M7502 Adhesive capsulitis of left shoulder: Secondary | ICD-10-CM | POA: Insufficient documentation

## 2024-01-14 NOTE — Telephone Encounter (Signed)
 I called pt today due to getting her report from [facility]  VNG testing. They suggests pt have PT for vestibular. I called pt about this and she wants to wait since she is seeing a Chiropractor tosee if it help before doing PT. She also needs to get a current hearing test as suggested by us  and [facility]. Pt will call back when needed.

## 2024-01-18 ENCOUNTER — Emergency Department (HOSPITAL_COMMUNITY)

## 2024-01-18 ENCOUNTER — Ambulatory Visit (INDEPENDENT_AMBULATORY_CARE_PROVIDER_SITE_OTHER): Admitting: Family

## 2024-01-18 ENCOUNTER — Encounter: Payer: Self-pay | Admitting: Family

## 2024-01-18 ENCOUNTER — Emergency Department (HOSPITAL_COMMUNITY)
Admission: EM | Admit: 2024-01-18 | Discharge: 2024-01-18 | Disposition: A | Discharge status: Home / Self Care | Attending: Emergency Medicine | Admitting: Emergency Medicine

## 2024-01-18 ENCOUNTER — Other Ambulatory Visit: Payer: Self-pay

## 2024-01-18 ENCOUNTER — Encounter (HOSPITAL_COMMUNITY): Payer: Self-pay

## 2024-01-18 VITALS — BP 118/66 | HR 111 | Temp 98.1°F | Ht 65.0 in | Wt 170.0 lb

## 2024-01-18 DIAGNOSIS — I10 Essential (primary) hypertension: Secondary | ICD-10-CM | POA: Diagnosis not present

## 2024-01-18 DIAGNOSIS — M5090 Cervical disc disorder, unspecified, unspecified cervical region: Secondary | ICD-10-CM | POA: Insufficient documentation

## 2024-01-18 DIAGNOSIS — I4891 Unspecified atrial fibrillation: Secondary | ICD-10-CM

## 2024-01-18 DIAGNOSIS — Z79899 Other long term (current) drug therapy: Secondary | ICD-10-CM | POA: Diagnosis not present

## 2024-01-18 DIAGNOSIS — R002 Palpitations: Secondary | ICD-10-CM

## 2024-01-18 DIAGNOSIS — I480 Paroxysmal atrial fibrillation: Secondary | ICD-10-CM | POA: Insufficient documentation

## 2024-01-18 DIAGNOSIS — Z7901 Long term (current) use of anticoagulants: Secondary | ICD-10-CM | POA: Diagnosis not present

## 2024-01-18 LAB — CBC
HCT: 41.6 % (ref 36.0–46.0)
Hemoglobin: 14.2 g/dL (ref 12.0–15.0)
MCH: 32.1 pg (ref 26.0–34.0)
MCHC: 34.1 g/dL (ref 30.0–36.0)
MCV: 93.9 fL (ref 80.0–100.0)
Platelets: 193 K/uL (ref 150–400)
RBC: 4.43 MIL/uL (ref 3.87–5.11)
RDW: 11.9 % (ref 11.5–15.5)
WBC: 5.9 K/uL (ref 4.0–10.5)
nRBC: 0 % (ref 0.0–0.2)

## 2024-01-18 LAB — BASIC METABOLIC PANEL WITH GFR
Anion gap: 9 (ref 5–15)
BUN: 15 mg/dL (ref 8–23)
CO2: 25 mmol/L (ref 22–32)
Calcium: 9.2 mg/dL (ref 8.9–10.3)
Chloride: 105 mmol/L (ref 98–111)
Creatinine, Ser: 0.66 mg/dL (ref 0.44–1.00)
GFR, Estimated: >60 mL/min (ref >60)
Glucose, Bld: 102 mg/dL — ABNORMAL HIGH (ref 70–99)
Potassium: 3.5 mmol/L (ref 3.5–5.1)
Sodium: 139 mmol/L (ref 135–145)

## 2024-01-18 LAB — TROPONIN I (HIGH SENSITIVITY): Troponin I (High Sensitivity): 9 ng/L (ref <18)

## 2024-01-18 MED ORDER — APIXABAN 5 MG PO TABS
5.0000 mg | ORAL_TABLET | Freq: Once | ORAL | Status: AC
  Administered 2024-01-18: 5 mg via ORAL
  Filled 2024-01-18: qty 1

## 2024-01-18 MED ORDER — HYDROCHLOROTHIAZIDE 12.5 MG PO CAPS: 12.5000 mg | ORAL_CAPSULE | Freq: Every day | ORAL | 0 refills | Status: DC

## 2024-01-18 MED ORDER — DILTIAZEM HCL 30 MG PO TABS: 30.0000 mg | ORAL_TABLET | Freq: Three times a day (TID) | ORAL | 0 refills | Status: DC | PRN

## 2024-01-18 MED ORDER — IRBESARTAN 150 MG PO TABS
150.0000 mg | ORAL_TABLET | Freq: Once | ORAL | Status: AC
  Administered 2024-01-18: 150 mg via ORAL
  Filled 2024-01-18: qty 1

## 2024-01-18 MED ORDER — APIXABAN 5 MG PO TABS
5.0000 mg | ORAL_TABLET | Freq: Two times a day (BID) | ORAL | 0 refills | Status: DC
Start: 2024-01-18 — End: 2024-02-17

## 2024-01-18 NOTE — Progress Notes (Signed)
 Acute Office Visit  Subjective:     Patient ID: Dana Small, female    DOB: 10-15-1945, 78 y.o.   MRN: [id number]  Chief Complaint  Patient presents with   Medical Management of Chronic Issues    Heart fluttering after stopping weight loss medication , stopped taking it a week ago and has been getting the fluttering out of nowhere , says it happens more when walking     HPI 6 -year-old female, patient of Dr. Rollene presents today with complaints of heart flutters that have occurred off and on over the past 3 to 4 days.  She feels the symptoms are related to discontinuing Zepbound.  Reports feeling like her legs are heavy when this occurs.  She went to water aerobics today and did not have any issues.  However, when she went back to her car she began to feel the heart flutters again.  No known history of any cardiac arrhythmias.  Denies any chest pain or shortness of breath.  Does feel anxious when the palpitations occur.  Review of Systems  Respiratory:  Negative for shortness of breath.   Cardiovascular:  Positive for palpitations.  Musculoskeletal:        Legs feel heavy when this occurs  All other systems reviewed and are negative.   Past Medical History:  Diagnosis Date   Arthritis    Hypertension     Social History   Socioeconomic History   Marital status: Married    Spouse name: Not on file   Number of children: Not on file   Years of education: Not on file   Highest education level: Associate degree: academic program  Occupational History   Not on file  Tobacco Use   Smoking status: Never   Smokeless tobacco: Never  Vaping Use   Vaping status: Never Used  Substance and Sexual Activity   Alcohol use: Yes   Drug use: Never   Sexual activity: Not on file  Other Topics Concern   Not on file  Social History Narrative   Not on file   Social Drivers of Health   Financial Resource Strain: Low Risk  (11/19/2023)   Overall Financial Resource Strain  (CARDIA)    Difficulty of Paying Living Expenses: Not hard at all  Food Insecurity: Low Risk  (11/20/2023)   Received from [facility]   Hunger Vital Sign    Within the past 12 months, you worried that your food would run out before you got money to buy more: Never true    Within the past 12 months, the food you bought just didn't last and you didn't have money to get more. : Never true  Transportation Needs: No Transportation Needs (11/20/2023)   Received from [facility]    In the past 12 months, has lack of reliable transportation kept you from medical appointments, meetings, work or from getting things needed for daily living? : No  Physical Activity: Sufficiently Active (11/19/2023)   Exercise Vital Sign    Days of Exercise per Week: 4 days    Minutes of Exercise per Session: 40 min  Stress: No Stress Concern Present (11/19/2023)   [employer] of Occupational Health - Occupational Stress Questionnaire    Feeling of Stress: Not at all  Social Connections: Unknown (11/19/2023)   Social Connection and Isolation Panel    Frequency of Communication with Friends and Family: Three times a week    Frequency of Social Gatherings with Friends  and Family: Three times a week    Attends Religious Services: More than 4 times per year    Active Member of Clubs or Organizations: Patient declined    Attends Banker Meetings: Not on file    Marital Status: Married  Intimate Partner Violence: Not At Risk (06/11/2023)   Humiliation, Afraid, Rape, and Kick questionnaire    Fear of Current or Ex-Partner: No    Emotionally Abused: No    Physically Abused: No    Sexually Abused: No    Past Surgical History:  Procedure Laterality Date   COLONOSCOPY  06/11/2013   [facility]   TOE SURGERY     tooth implant      Family History  Problem Relation Age of Onset   Heart disease Other        Parents   Hypertension Other        Parents   Diverticulitis  Father    Breast cancer Neg Hx    Colon cancer Neg Hx    Esophageal cancer Neg Hx    Cancer Neg Hx    Stomach cancer Neg Hx    Rectal cancer Neg Hx     No Known Allergies  Current Outpatient Medications on File Prior to Visit  Medication Sig Dispense Refill   clobetasol  (TEMOVATE ) 0.05 % external solution Apply 1 Application topically 2 (two) times daily. Apply to scalp 2x daily until cleared and/or PRN 50 mL 0   clotrimazole -betamethasone  (LOTRISONE ) cream Apply 1 Application topically 2 (two) times daily. 30 g 1   irbesartan  (AVAPRO ) 150 MG tablet TAKE 1 TABLET(150 MG) BY MOUTH DAILY 90 tablet 1   pravastatin  (PRAVACHOL ) 20 MG tablet TAKE 1 TABLET(20 MG) BY MOUTH DAILY 90 tablet 3   triamcinolone  cream (KENALOG ) 0.1 % Apply 1 Application topically 2 (two) times daily as needed. For itch, apply for 2 weeks then stop, Repeat as needed 240 g 11   tirzepatide (ZEPBOUND) 2.5 MG/0.5ML injection vial Inject 2.5 mg into the skin. (Patient not taking: Reported on 01/18/2024)     No current facility-administered medications on file prior to visit.    BP 118/66 (BP Location: Right Arm, Patient Position: Sitting, Cuff Size: Normal)   Pulse (!) 111   Temp 98.1 F (36.7 C) (Oral)   Ht 5' 5 (1.651 m)   Wt 170 lb (77.1 kg)   SpO2 98%   BMI 28.29 kg/m chart     Objective:    BP 118/66 (BP Location: Right Arm, Patient Position: Sitting, Cuff Size: Normal)   Pulse (!) 111   Temp 98.1 F (36.7 C) (Oral)   Ht 5' 5 (1.651 m)   Wt 170 lb (77.1 kg)   SpO2 98%   BMI 28.29 kg/m    Physical Exam Vitals reviewed.  Constitutional:      Appearance: Normal appearance. She is normal weight.  HENT:     Head: Normocephalic.  Eyes:     Pupils: Pupils are equal, round, and reactive to light.  Cardiovascular:     Rate and Rhythm: Tachycardia present. Rhythm irregular.     Comments: EKG shows atrial fibrillation with RVR heart rate of 142 Pulmonary:     Effort: Pulmonary effort is normal.      Breath sounds: Normal breath sounds.  Abdominal:     General: Abdomen is flat. Bowel sounds are normal.     Palpations: Abdomen is soft.  Musculoskeletal:        General: Normal range  of motion.     Cervical back: Normal range of motion and neck supple.  Skin:    General: Skin is warm and dry.  Neurological:     General: No focal deficit present.     Mental Status: She is alert and oriented to person, place, and time. Mental status is at baseline.  Psychiatric:        Mood and Affect: Mood normal.        Behavior: Behavior normal.        Thought Content: Thought content normal.     No results found for any visits on 01/18/24.      Assessment & Plan:   Problem List Items Addressed This Visit   None Visit Diagnoses       Atrial fibrillation with RVR (HCC)    -  Primary     Heart palpitations         Patient called to have her husband come to transport her to [facility] emergency department.  She also called her daughter to ensure that her daughter was aware who will meet her at the emergency department.  Report called to Burnard, charge nurse in the ED.  Advised the patient will be arriving via private vehicle.  Copy of the EKG was sent.  As of note, patient was back in sinus rhythm prior to leaving.  No orders of the defined types were placed in this encounter.   No follow-ups on file.  Pasha Broad B Ozro Russett, FNP

## 2024-01-18 NOTE — ED Triage Notes (Signed)
 Pt has palpitations on a off fro last few days. Denies cp, sob.

## 2024-01-18 NOTE — ED Triage Notes (Signed)
 Pt coming in today complaining of palpitations. Pt denies shortness of breath and chest pain. Pt reports that palpitations started 3 days ago . Pt reports that 2 months ago she had similar symptoms with starting Wellbutrin  and she stopped taking it. Pt reporting anxiety.

## 2024-01-18 NOTE — Progress Notes (Signed)
 PHARMACY - ANTICOAGULATION CONSULT NOTE  Pharmacy Consult for Apixaban Indication: atrial fibrillation  No Known Allergies  Patient Measurements: Height: 5' 4 (162.6 cm) Weight: 77.1 kg (170 lb) IBW/kg (Calculated) : 54.7 HEPARIN DW (KG): 71  Vital Signs: Temp: 98.8 F (37.1 C) (10/03 1830) Temp Source: Oral (10/03 1830) BP: 167/70 (10/03 1900) Pulse Rate: 52 (10/03 1900)  Labs: Recent Labs    01/18/24 1557  HGB 14.2  HCT 41.6  PLT 193  CREATININE 0.66  TROPONINIHS 9    Estimated Creatinine Clearance: 59.2 mL/min (by C-G formula based on SCr of 0.66 mg/dL).   Medical History: Past Medical History:  Diagnosis Date   Arthritis    Hypertension    Assessment: 50 yof with history of HTN presenting with palpitations. Apixaban per pharmacy consult placed for new onset AF.  Patient is not on anticoagulation prior to visit.  Hgb 14.2; plt 193  Patient does not meet dose reduction criteria for AF indication.  Goal of Therapy:  Monitor platelets by anticoagulation protocol: Yes   Plan:  Give first dose of apixaban 5 mg bid in ED tonight prior to discharge Patient requests apixaban prescription to be sent to [facility] at [address] given it is the weekend. Dr. Patt notified. Education and coupon cards provided to patient by myself.  Dorn Buttner, PharmD, BCPS 01/18/2024 7:53 PM ED Clinical Pharmacist -  [phone]

## 2024-01-18 NOTE — ED Provider Notes (Signed)
 [place] EMERGENCY DEPARTMENT AT [facility] Provider Note   CSN: [id number] Arrival date & time: 01/18/24  1504     Patient presents with: Palpitations   Dana Small is a 78 y.o. female history of hypertension, here presenting with palpitations.  Patient states that she is normally very active.  She states that occasionally she noticed some palpitations that is not worse with exertion starting about 3 days ago.  Patient states that they can occur randomly and she noticed that they lasted longer.  Patient went to water rugby today and had no symptoms while she was exercising.  Patient attributed her symptoms to starting Wellbutrin  several months ago so she stopped taking Wellbutrin .  Patient also had been on Zepbound for weight loss.  She went to her primary care doctor's office and was noted to be in rapid A-fib and sent here for further evaluation.  Patient has no history of A-fib and is not on blood thinner   The history is provided by the patient.       Prior to Admission medications   Medication Sig Start Date End Date Taking? Authorizing Provider  clobetasol  (TEMOVATE ) 0.05 % external solution Apply 1 Application topically 2 (two) times daily. Apply to scalp 2x daily until cleared and/or PRN 09/05/22   Alm Delon SAILOR, DO  clotrimazole -betamethasone  (LOTRISONE ) cream Apply 1 Application topically 2 (two) times daily. 01/24/22   Purcell Emil Schanz, MD  irbesartan  (AVAPRO ) 150 MG tablet TAKE 1 TABLET(150 MG) BY MOUTH DAILY 11/28/23   Rollene Almarie LABOR, MD  pravastatin  (PRAVACHOL ) 20 MG tablet TAKE 1 TABLET(20 MG) BY MOUTH DAILY 05/04/23   Rollene Almarie LABOR, MD  tirzepatide (ZEPBOUND) 2.5 MG/0.5ML injection vial Inject 2.5 mg into the skin. Patient not taking: Reported on 01/18/2024 12/11/23   [provider]  triamcinolone  cream (KENALOG ) 0.1 % Apply 1 Application topically 2 (two) times daily as needed. For itch, apply for 2 weeks then stop, Repeat as  needed 09/05/22   Alm Delon SAILOR, DO    Allergies: Patient has no known allergies.    Review of Systems  Cardiovascular:  Positive for palpitations.  All other systems reviewed and are negative.   Updated Vital Signs BP (!) 193/73 (BP Location: Right Arm)   Pulse 66   Temp 98.8 F (37.1 C) (Oral)   Resp (!) 22   Ht 5' 4 (1.626 m)   Wt 77.1 kg   SpO2 100%   BMI 29.18 kg/m   Physical Exam Vitals and nursing note reviewed.  Constitutional:      Appearance: Normal appearance.  HENT:     Head: Normocephalic.     Nose: Nose normal.     Mouth/Throat:     Mouth: Mucous membranes are moist.  Eyes:     Extraocular Movements: Extraocular movements intact.     Pupils: Pupils are equal, round, and reactive to light.  Cardiovascular:     Rate and Rhythm: Normal rate and regular rhythm.     Pulses: Normal pulses.     Heart sounds: Normal heart sounds.  Pulmonary:     Effort: Pulmonary effort is normal.     Breath sounds: Normal breath sounds.  Abdominal:     General: Abdomen is flat.     Palpations: Abdomen is soft.  Musculoskeletal:        General: Normal range of motion.     Cervical back: Normal range of motion and neck supple.  Skin:    General: Skin  is warm.     Capillary Refill: Capillary refill takes less than 2 seconds.  Neurological:     General: No focal deficit present.     Mental Status: She is alert and oriented to person, place, and time.  Psychiatric:        Mood and Affect: Mood normal.        Behavior: Behavior normal.     (all labs ordered are listed, but only abnormal results are displayed) Labs Reviewed  BASIC METABOLIC PANEL WITH GFR - Abnormal; Notable for the following components:      Result Value   Glucose, Bld 102 (*)    All other components within normal limits  CBC  TROPONIN I (HIGH SENSITIVITY)    EKG: EKG Interpretation Date/Time:  Friday January 18 2024 15:51:17 EDT Ventricular Rate:  64 PR Interval:  160 QRS  Duration:  138 QT Interval:  426 QTC Calculation: 439 R Axis:   -50  Text Interpretation: Normal sinus rhythm Right bundle branch block Left anterior fascicular block Bifascicular block Abnormal ECG No previous ECGs available Confirmed by Patt Alm DEL [phone]) on 01/18/2024 6:37:27 PM  Radiology: DG Chest 2 View Result Date: 01/18/2024 CLINICAL DATA:  Palpitations. EXAM: CHEST - 2 VIEW COMPARISON:  None Available. FINDINGS: Bilateral lung fields are clear. Bilateral costophrenic angles are clear. Normal cardio-mediastinal silhouette. No acute osseous abnormalities. The soft tissues are within normal limits. IMPRESSION: *No active cardiopulmonary disease. Electronically Signed   By: Ree Molt M.D.   On: 01/18/2024 16:40     Procedures   Medications Ordered in the ED  irbesartan  (AVAPRO ) tablet 150 mg (has no administration in time range)                                    Medical Decision Making [name] is a 78 y.o. female here presenting with palpitations.  Reviewed her tracing from the office and patient appears to be in rapid A-fib with a heart rate of 130s.  In the ED patient is in sinus rhythm with a heart rate of the 60s.  I think her symptoms consistent paroxysmal A-fib.  Plan to check electrolytes and chest x-ray.  Patient's [place] Vascor is 4.  Patient will likely need to be started on anticoagulation.  8:00 PM Labs unremarkable.  Patient is started on Eliquis.  Patient's blood pressure is also consistently 1 70-1 80s.  She is maxed out on Avapro .  She was hydrochlorothiazide  previously I will put her back on that.  Will also add Cardizem as needed.  Will have her follow-up with cardiology  Problems Addressed: Hypertension, unspecified type: acute illness or injury Paroxysmal A-fib (HCC): acute illness or injury  Amount and/or Complexity of Data Reviewed Labs: ordered. Decision-making details documented in ED Course. Radiology: ordered and independent interpretation  performed. Decision-making details documented in ED Course.  Risk Prescription drug management.     Final diagnoses:  None    ED Discharge Orders     None          Patt Alm Macho, MD 01/18/24 2001

## 2024-01-18 NOTE — Discharge Instructions (Addendum)
 You have intermittent atrial fibrillation.  I have prescribed Eliquis 5 mg twice daily to prevent stroke  If your heart rate is consistently greater than 100, I recommend you take Cardizem 30 mg as needed  I have referred you to A-fib clinic  Your blood pressure is elevated I recommend you continue taking your Avapro .  I have added hydrochlorothiazide  12.5 mg daily.  Please see your doctor in a week to recheck your blood pressure  Return to ER if you have worse dizziness or chest pain or palpitations   Information on my medicine - ELIQUIS (apixaban)  This medication education was reviewed with me or my healthcare representative as part of my discharge preparation.  The pharmacist that spoke with me during my hospital stay was:  Dorn LITTIE Buttner, [facility]  Why was Eliquis prescribed for you? Eliquis was prescribed for you to reduce the risk of a blood clot forming that can cause a stroke if you have a medical condition called atrial fibrillation (a type of irregular heartbeat).  What do You need to know about Eliquis ? Take your Eliquis TWICE DAILY - one tablet in the morning and one tablet in the evening with or without food. If you have difficulty swallowing the tablet whole please discuss with your pharmacist how to take the medication safely.  Take Eliquis exactly as prescribed by your doctor and DO NOT stop taking Eliquis without talking to the doctor who prescribed the medication.  Stopping may increase your risk of developing a stroke.  Refill your prescription before you run out.  After discharge, you should have regular check-up appointments with your healthcare provider that is prescribing your Eliquis.  In the future your dose may need to be changed if your kidney function or weight changes by a significant amount or as you get older.  What do you do if you miss a dose? If you miss a dose, take it as soon as you remember on the same day and resume taking twice daily.  Do not  take more than one dose of ELIQUIS at the same time to make up a missed dose.  Important Safety Information A possible side effect of Eliquis is bleeding. You should call your healthcare provider right away if you experience any of the following: Bleeding from an injury or your nose that does not stop. Unusual colored urine (red or dark brown) or unusual colored stools (red or black). Unusual bruising for unknown reasons. A serious fall or if you hit your head (even if there is no bleeding).  Some medicines may interact with Eliquis and might increase your risk of bleeding or clotting while on Eliquis. To help avoid this, consult your healthcare provider or pharmacist prior to using any new prescription or non-prescription medications, including herbals, vitamins, non-steroidal anti-inflammatory drugs (NSAIDs) and supplements.  This website has more information on Eliquis (apixaban): http://www.eliquis.com/eliquis/home

## 2024-01-18 NOTE — ED Triage Notes (Signed)
 Pt reports that the palpitations are not related to activity.

## 2024-01-18 NOTE — Addendum Note (Signed)
 Addended by: WONDA BETTER on: 01/18/2024 03:04 PM   Modules accepted: Orders

## 2024-01-23 ENCOUNTER — Encounter (HOSPITAL_COMMUNITY): Payer: Self-pay | Admitting: Internal Medicine

## 2024-01-23 ENCOUNTER — Ambulatory Visit (HOSPITAL_COMMUNITY)
Admission: RE | Admit: 2024-01-23 | Discharge: 2024-01-23 | Disposition: A | Discharge status: Home / Self Care | Source: Ambulatory Visit | Attending: Internal Medicine | Admitting: Internal Medicine

## 2024-01-23 VITALS — BP 148/68 | HR 65 | Ht 64.0 in | Wt 172.0 lb

## 2024-01-23 DIAGNOSIS — D6869 Other thrombophilia: Secondary | ICD-10-CM | POA: Insufficient documentation

## 2024-01-23 DIAGNOSIS — I4891 Unspecified atrial fibrillation: Secondary | ICD-10-CM | POA: Diagnosis not present

## 2024-01-23 DIAGNOSIS — I480 Paroxysmal atrial fibrillation: Secondary | ICD-10-CM | POA: Diagnosis not present

## 2024-01-23 MED ORDER — APIXABAN 5 MG PO TABS: 5.0000 mg | ORAL_TABLET | Freq: Two times a day (BID) | ORAL | 6 refills | Status: DC

## 2024-01-23 MED ORDER — DILTIAZEM HCL 30 MG PO TABS: 30.0000 mg | ORAL_TABLET | Freq: Three times a day (TID) | ORAL | 2 refills | Status: AC | PRN

## 2024-01-23 NOTE — Progress Notes (Signed)
 Primary Care Physician: Rollene Almarie LABOR, MD Primary Cardiologist: None Electrophysiologist: None     Referring Physician: ED     Dana Small is a 78 y.o. female with a history of HTN, RBBB, HLD, CKD, and atrial fibrillation who presents for consultation in the [facility]. ED visit on 10/3 for new Afib found earlier in day by PCP. Patient was in SR in ED but ED note shows ECG from PCP demonstrating Afib with RVR. Patient is on Eliquis for a CHADS2VASC score of 4.  On evaluation today, patient is currently in NSR. She notes leading up to ED visit she had a lot of different stressful situations. She has not had any further episodes of palpitations since ED visit. No missed doses of Eliquis. She drinks minimal caffeine and alcohol intake. She does not snore.  Today, she denies symptoms of chest pain, shortness of breath, orthopnea, PND, lower extremity edema, dizziness, presyncope, syncope, bleeding, or neurologic sequela. The patient is tolerating medications without difficulties and is otherwise without complaint today.    Atrial Fibrillation Risk Factors:  she does not have symptoms or diagnosis of sleep apnea.   she has a BMI of Body mass index is 29.52 kg/m.SABRA Filed Weights   01/23/24 1457  Weight: 78 kg    Current Outpatient Medications  Medication Sig Dispense Refill   apixaban (ELIQUIS) 5 MG TABS tablet Take 1 tablet (5 mg total) by mouth 2 (two) times daily. 60 tablet 0   clobetasol  (TEMOVATE ) 0.05 % external solution Apply 1 Application topically 2 (two) times daily. Apply to scalp 2x daily until cleared and/or PRN 50 mL 0   clotrimazole -betamethasone  (LOTRISONE ) cream Apply 1 Application topically 2 (two) times daily. 30 g 1   diltiazem (CARDIZEM) 30 MG tablet Take 1 tablet (30 mg total) by mouth 3 (three) times daily as needed (heart rate > 100). 30 tablet 0   hydrochlorothiazide  (MICROZIDE ) 12.5 MG capsule Take 1 capsule (12.5 mg  total) by mouth daily. 30 capsule 0   irbesartan  (AVAPRO ) 150 MG tablet TAKE 1 TABLET(150 MG) BY MOUTH DAILY 90 tablet 1   pravastatin  (PRAVACHOL ) 20 MG tablet TAKE 1 TABLET(20 MG) BY MOUTH DAILY 90 tablet 3   triamcinolone  cream (KENALOG ) 0.1 % Apply 1 Application topically 2 (two) times daily as needed. For itch, apply for 2 weeks then stop, Repeat as needed 240 g 11   No current facility-administered medications for this encounter.    Atrial Fibrillation Management history:  Previous antiarrhythmic drugs: none Previous cardioversions: none Previous ablations: none Anticoagulation history: Eliquis   ROS- All systems are reviewed and negative except as per the HPI above.  Physical Exam: BP (!) 148/68   Pulse 65   Ht 5' 4 (1.626 m)   Wt 78 kg   BMI 29.52 kg/m   GEN: Well nourished, well developed in no acute distress NECK: No JVD; No carotid bruits CARDIAC: Regular rate and rhythm, no murmurs, rubs, gallops RESPIRATORY:  Clear to auscultation without rales, wheezing or rhonchi  ABDOMEN: Soft, non-tender, non-distended EXTREMITIES:  No edema; No deformity   EKG today demonstrates  Vent. rate 65 BPM PR interval 152 ms QRS duration 134 ms QT/QTcB 424/440 ms P-R-T axes 58 -45 64 Normal sinus rhythm Right bundle branch block Left anterior fascicular block Bifascicular block Abnormal ECG When compared with ECG of 18-Jan-2024 15:51, No significant change was found  Echo N/A  ASSESSMENT & PLAN CHA2DS2-VASc Score = 4  The patient's score is based upon: CHF History: 0 HTN History: 1 Diabetes History: 0 Stroke History: 0 Vascular Disease History: 0 Age Score: 2 Gender Score: 1       ASSESSMENT AND PLAN: Paroxysmal Atrial Fibrillation (ICD10:  I48.0) The patient's CHA2DS2-VASc score is 4, indicating a 4.8% annual risk of stroke.    Patient is currently in NSR. Education provided about Afib. Discussion about triggers for Afiba nd medication treatments and  ablation going forward if indicated. After discussion, we will proceed with conservative observation at this time. Rhythm monitoring device recommended. Explained how to take diltiazem 30 mg PRN. Will order baseline echocardiogram. Patient declines sleep study at this time.   Secondary Hypercoagulable State (ICD10:  D68.69) The patient is at significant risk for stroke/thromboembolism based upon her CHA2DS2-VASc Score of 4.  Continue Apixaban (Eliquis).  We discussed the reasoning behind anticoagulation in the setting of stroke prevention related to Afib. We discussed the benefits vs risks of anticoagulation. After discussion, patient would like to continue anticoagulation and understands the potential risks. Continue Eliquis 5 mg BID and will print order for patient to have CBC in 1 month.    Follow up 6 months Afib clinic.   Terra Pac, [facility]  Afib Clinic [address] [place], [place] [address] [phone]

## 2024-01-23 NOTE — Patient Instructions (Signed)
 Echocardiogram -- scheduling will call once insurance authorization received.   Get lab work in one month 02/23/24 at any [facility]  You may go to any [facility] Location for your lab work:  [facility] - [address] ([facility]) - [address] [phone] [address]   If you have any lab test that is abnormal or we need to change your treatment, we will call you

## 2024-01-24 ENCOUNTER — Other Ambulatory Visit (HOSPITAL_COMMUNITY): Payer: Self-pay

## 2024-01-24 DIAGNOSIS — I4891 Unspecified atrial fibrillation: Secondary | ICD-10-CM

## 2024-02-07 ENCOUNTER — Other Ambulatory Visit (HOSPITAL_BASED_OUTPATIENT_CLINIC_OR_DEPARTMENT_OTHER): Payer: Self-pay

## 2024-02-07 DIAGNOSIS — Z23 Encounter for immunization: Secondary | ICD-10-CM | POA: Diagnosis not present

## 2024-02-07 MED ORDER — FLUZONE HIGH-DOSE 0.5 ML IM SUSY
0.5000 mL | PREFILLED_SYRINGE | Freq: Once | INTRAMUSCULAR | 0 refills | Status: AC
  Filled 2024-02-07: qty 0.5, 1d supply, fill #0

## 2024-02-15 ENCOUNTER — Other Ambulatory Visit (HOSPITAL_COMMUNITY): Payer: Self-pay | Admitting: *Deleted

## 2024-02-15 MED ORDER — HYDROCHLOROTHIAZIDE 12.5 MG PO CAPS: 12.5000 mg | ORAL_CAPSULE | Freq: Every day | ORAL | 3 refills | Status: DC

## 2024-02-18 ENCOUNTER — Encounter: Payer: Self-pay | Admitting: Radiology

## 2024-02-25 ENCOUNTER — Ambulatory Visit (HOSPITAL_COMMUNITY)
Admission: RE | Admit: 2024-02-25 | Discharge: 2024-02-25 | Disposition: A | Discharge status: Home / Self Care | Source: Ambulatory Visit | Attending: Cardiovascular Disease | Admitting: Cardiovascular Disease

## 2024-02-25 DIAGNOSIS — I4891 Unspecified atrial fibrillation: Secondary | ICD-10-CM | POA: Insufficient documentation

## 2024-02-25 LAB — ECHOCARDIOGRAM COMPLETE
Area-P 1/2: 3.21 cm2
S' Lateral: 2.7 cm

## 2024-02-26 ENCOUNTER — Ambulatory Visit (HOSPITAL_COMMUNITY): Payer: Self-pay | Admitting: Internal Medicine

## 2024-02-26 LAB — CBC
Hematocrit: 43.6 % (ref 34.0–46.6)
Hemoglobin: 14.3 g/dL (ref 11.1–15.9)
MCH: 32.2 pg (ref 26.6–33.0)
MCHC: 32.8 g/dL (ref 31.5–35.7)
MCV: 98 fL — ABNORMAL HIGH (ref 79–97)
Platelets: 212 x10E3/uL (ref 150–450)
RBC: 4.44 x10E6/uL (ref 3.77–5.28)
RDW: 11.9 % (ref 11.7–15.4)
WBC: 7.5 x10E3/uL (ref 3.4–10.8)

## 2024-02-27 DIAGNOSIS — M7502 Adhesive capsulitis of left shoulder: Secondary | ICD-10-CM | POA: Diagnosis not present

## 2024-02-28 ENCOUNTER — Telehealth (HOSPITAL_COMMUNITY): Payer: Self-pay | Admitting: *Deleted

## 2024-02-28 NOTE — Progress Notes (Signed)
 Cardiology Office Note:    Date:  03/03/2024   ID:  SRITHA CHAUNCEY, DOB 24-Dec-1945, MRN [id number]  PCP:  Rollene Almarie LABOR, MD   [facility] Providers Cardiologist:  None     Referring MD: Rollene Almarie LABOR, *   Chief Complaint  Patient presents with   Atrial Fibrillation    History of Present Illness:    Dana Small is a 78 y.o. female is seen at the request of Dr Rollene for evaluation of Atrial fibrillation. She is Dr Russ mother in law. She has a history of HTN, HLD, RBBB. She was seen in the ED on 10/3 with AFib with RVR. This was following a prolonged dental procedure. Converted spontaneously to NSR. Was started on Eliquis and PRN diltiazem. Echo was normal.   She has done well since then. No clear recurrence. Has just gotten a K[employer]iam[employer] device. She did have one day with gross hematuria. Urine negative for infection.   Past Medical History:  Diagnosis Date   Arthritis    Hypertension     Past Surgical History:  Procedure Laterality Date   COLONOSCOPY  06/11/2013   [facility]   TOE SURGERY     tooth implant      Current Medications: Current Meds  Medication Sig   apixaban (ELIQUIS) 5 MG TABS tablet Take 1 tablet (5 mg total) by mouth 2 (two) times daily.   diltiazem (CARDIZEM) 30 MG tablet Take 1 tablet (30 mg total) by mouth 3 (three) times daily as needed (heart rate > 100).   hydrochlorothiazide  (MICROZIDE ) 12.5 MG capsule Take 1 capsule (12.5 mg total) by mouth daily.   irbesartan  (AVAPRO ) 150 MG tablet TAKE 1 TABLET(150 MG) BY MOUTH DAILY   pravastatin  (PRAVACHOL ) 20 MG tablet TAKE 1 TABLET(20 MG) BY MOUTH DAILY     Allergies:   Patient has no known allergies.   Social History   Socioeconomic History   Marital status: Married    Spouse name: Not on file   Number of children: Not on file   Years of education: Not on file   Highest education level: Associate degree: occupational, scientist, product/process development, or vocational  program  Occupational History   Not on file  Tobacco Use   Smoking status: Never   Smokeless tobacco: Never   Tobacco comments:    Never smoked 01/23/24  Vaping Use   Vaping status: Never Used  Substance and Sexual Activity   Alcohol use: Not Currently   Drug use: Never   Sexual activity: Not on file  Other Topics Concern   Not on file  Social History Narrative   Not on file   Social Drivers of Health   Financial Resource Strain: Low Risk  (02/28/2024)   Overall Financial Resource Strain (CARDIA)    Difficulty of Paying Living Expenses: Not hard at all  Food Insecurity: No Food Insecurity (02/28/2024)   Hunger Vital Sign    Worried About Running Out of Food in the Last Year: Never true    Ran Out of Food in the Last Year: Never true  Transportation Needs: No Transportation Needs (02/28/2024)   PRAPARE - Administrator, Civil Service (Medical): No    Lack of Transportation (Non-Medical): No  Physical Activity: Sufficiently Active (02/28/2024)   Exercise Vital Sign    Days of Exercise per Week: 3 days    Minutes of Exercise per Session: 50 min  Stress: No Stress Concern Present (02/28/2024)   [employer]  of Occupational Health - Occupational Stress Questionnaire    Feeling of Stress: Only a little  Social Connections: Moderately Integrated (02/28/2024)   Social Connection and Isolation Panel    Frequency of Communication with Friends and Family: Three times a week    Frequency of Social Gatherings with Friends and Family: Three times a week    Attends Religious Services: More than 4 times per year    Active Member of Clubs or Organizations: No    Attends Engineer, Structural: Not on file    Marital Status: Married     Family History: The patient's family history includes Diverticulitis in her father; Heart disease in an other family member; Hypertension in an other family member. There is no history of Breast cancer, Colon cancer, Esophageal  cancer, Cancer, Stomach cancer, or Rectal cancer.  ROS:   Please see the history of present illness.     All other systems reviewed and are negative.  EKGs/Labs/Other Studies Reviewed:    The following studies were reviewed today: Echo 02/25/24: IMPRESSIONS     1. Left ventricular ejection fraction, by estimation, is 60 to 65%. The  left ventricle has normal function. The left ventricle has no regional  wall motion abnormalities. There is mild concentric left ventricular  hypertrophy. Left ventricular diastolic  parameters are consistent with Grade I diastolic dysfunction (impaired  relaxation).   2. Right ventricular systolic function is normal. The right ventricular  size is normal.   3. The mitral valve is normal in structure. No evidence of mitral valve  regurgitation. No evidence of mitral stenosis.   4. The aortic valve is tricuspid. Aortic valve regurgitation is not  visualized. No aortic stenosis is present.   5. The inferior vena cava is normal in size with greater than 50%  respiratory variability, suggesting right atrial pressure of 3 mmHg.        Recent Labs: 07/17/2023: ALT 24; TSH 2.16 01/18/2024: BUN 15; Creatinine, Ser 0.66; Potassium 3.5; Sodium 139 02/25/2024: Hemoglobin 14.3; Platelets 212  Recent Lipid Panel    Component Value Date/Time   CHOL 170 07/17/2023 1454   TRIG 133.0 07/17/2023 1454   HDL 58.50 07/17/2023 1454   CHOLHDL 3 07/17/2023 1454   VLDL 26.6 07/17/2023 1454   LDLCALC 85 07/17/2023 1454     Risk Assessment/Calculations:    CHA2DS2-VASc Score = 4   This indicates a 4.8% annual risk of stroke. The patient's score is based upon: CHF History: 0 HTN History: 1 Diabetes History: 0 Stroke History: 0 Vascular Disease History: 0 Age Score: 2 Gender Score: 1     HYPERTENSION CONTROL Vitals:   03/03/24 1102 03/03/24 1119  BP: (!) 172/60 (!) 140/64    The patient's blood pressure is elevated above target today.  In order to  address the patient's elevated BP: Blood pressure will be monitored at home to determine if medication changes need to be made.            Physical Exam:    VS:  BP (!) 140/64 (Cuff Size: Large)   Pulse 62   Ht 5' 4.5 (1.638 m)   Wt 169 lb 12.8 oz (77 kg)   SpO2 98%   BMI 28.70 kg/m     Wt Readings from Last 3 Encounters:  03/03/24 169 lb 12.8 oz (77 kg)  02/29/24 171 lb (77.6 kg)  01/23/24 172 lb (78 kg)     GEN:  Well nourished, well developed in no acute distress HEENT:  Normal NECK: No JVD; No carotid bruits LYMPHATICS: No lymphadenopathy CARDIAC: RRR, no murmurs, rubs, gallops RESPIRATORY:  Clear to auscultation without rales, wheezing or rhonchi  ABDOMEN: Soft, non-tender, non-distended MUSCULOSKELETAL:  No edema; No deformity  SKIN: Warm and dry NEUROLOGIC:  Alert and oriented x 3 PSYCHIATRIC:  Normal affect   ASSESSMENT:    1. Paroxysmal atrial fibrillation (HCC)   2. Primary hypertension    PLAN:    In order of problems listed above:  Paroxysmal Afib x 1. Clinically doing well. CHAD Vasc score of 4. Needs to continue Eliquis at current dose. Has diltiazem to take PRN. If she has more frequent/sustained Afib will have EP see HTN. BP mildly elevated. Monitor for now. If not at goal would increase irbesartan  to 300 mg daily Hematuria without infection. Needs to have Urology evaluate.        Follow up in 3 months.    Medication Adjustments/Labs and Tests Ordered: Current medicines are reviewed at length with the patient today.  Concerns regarding medicines are outlined above.  No orders of the defined types were placed in this encounter.  No orders of the defined types were placed in this encounter.   Patient Instructions  Medication Instructions:  Continue same medications *If you need a refill on your cardiac medications before your next appointment, please call your pharmacy*  Lab Work: None ordered  Testing/Procedures: None  ordered  Follow-Up: At [facility], you and your health needs are our priority.  As part of our continuing mission to provide you with exceptional heart care, our providers are all part of one team.  This team includes your primary Cardiologist (physician) and Advanced Practice Providers or APPs (Physician Assistants and Nurse Practitioners) who all work together to provide you with the care you need, when you need it.  Your next appointment:  3 months    Provider:  Dr.Murry Khiev    We recommend signing up for the patient portal called MyChart.  Sign up information is provided on this After Visit Summary.  MyChart is used to connect with patients for Virtual Visits (Telemedicine).  Patients are able to view lab/test results, encounter notes, upcoming appointments, etc.  Non-urgent messages can be sent to your provider as well.   To learn more about what you can do with MyChart, go to [email or web address].      Signed, Jawon Dipiero, MD  03/03/2024 11:34 AM    [facility]

## 2024-02-28 NOTE — Progress Notes (Signed)
    Subjective:    Patient ID: Dana Small, female    DOB: 1945/12/10, 78 y.o.   MRN: [id number]      HPI Dana Small is here for No chief complaint on file.        Medications and allergies reviewed with patient and updated if appropriate.  Current Outpatient Medications on File Prior to Visit  Medication Sig Dispense Refill   apixaban (ELIQUIS) 5 MG TABS tablet Take 1 tablet (5 mg total) by mouth 2 (two) times daily. 60 tablet 6   clobetasol  (TEMOVATE ) 0.05 % external solution Apply 1 Application topically 2 (two) times daily. Apply to scalp 2x daily until cleared and/or PRN 50 mL 0   clotrimazole -betamethasone  (LOTRISONE ) cream Apply 1 Application topically 2 (two) times daily. 30 g 1   diltiazem (CARDIZEM) 30 MG tablet Take 1 tablet (30 mg total) by mouth 3 (three) times daily as needed (heart rate > 100). 15 tablet 2   hydrochlorothiazide  (MICROZIDE ) 12.5 MG capsule Take 1 capsule (12.5 mg total) by mouth daily. 30 capsule 3   irbesartan  (AVAPRO ) 150 MG tablet TAKE 1 TABLET(150 MG) BY MOUTH DAILY 90 tablet 1   pravastatin  (PRAVACHOL ) 20 MG tablet TAKE 1 TABLET(20 MG) BY MOUTH DAILY 90 tablet 3   triamcinolone  cream (KENALOG ) 0.1 % Apply 1 Application topically 2 (two) times daily as needed. For itch, apply for 2 weeks then stop, Repeat as needed 240 g 11   No current facility-administered medications on file prior to visit.    Review of Systems     Objective:  There were no vitals filed for this visit. BP Readings from Last 3 Encounters:  01/23/24 (!) 148/68  01/18/24 (!) 175/59  01/18/24 118/66   Wt Readings from Last 3 Encounters:  01/23/24 172 lb (78 kg)  01/18/24 170 lb (77.1 kg)  01/18/24 170 lb (77.1 kg)   There is no height or weight on file to calculate BMI.    Physical Exam         Assessment & Plan:    See Problem List for Assessment and Plan of chronic medical problems.

## 2024-02-28 NOTE — Telephone Encounter (Signed)
 Pt called reporting blood in her urine today. I discussed with J.Suarez P.A. he recommends the following, follow up with PCP / specialist for work up of hematuria and go from there. Taking the lower dose would not protect her against stroke. Pt will call PCP for appt. Recent labs this week CBC stable at this time.

## 2024-02-29 ENCOUNTER — Encounter: Payer: Self-pay | Admitting: Internal Medicine

## 2024-02-29 ENCOUNTER — Ambulatory Visit (INDEPENDENT_AMBULATORY_CARE_PROVIDER_SITE_OTHER): Admitting: Internal Medicine

## 2024-02-29 VITALS — BP 124/80 | HR 58 | Temp 98.4°F | Ht 64.0 in | Wt 171.0 lb

## 2024-02-29 DIAGNOSIS — E785 Hyperlipidemia, unspecified: Secondary | ICD-10-CM

## 2024-02-29 DIAGNOSIS — R310 Gross hematuria: Secondary | ICD-10-CM | POA: Diagnosis not present

## 2024-02-29 DIAGNOSIS — I10 Essential (primary) hypertension: Secondary | ICD-10-CM | POA: Diagnosis not present

## 2024-02-29 DIAGNOSIS — I480 Paroxysmal atrial fibrillation: Secondary | ICD-10-CM

## 2024-02-29 LAB — URINALYSIS, ROUTINE W REFLEX MICROSCOPIC
Bilirubin Urine: NEGATIVE
Ketones, ur: NEGATIVE
Leukocytes,Ua: NEGATIVE
Nitrite: NEGATIVE
Specific Gravity, Urine: <=1.005 — AB (ref 1.000–1.030)
Total Protein, Urine: NEGATIVE
Urine Glucose: NEGATIVE
Urobilinogen, UA: 0.2 (ref 0.0–1.0)
pH: 6 (ref 5.0–8.0)

## 2024-02-29 NOTE — Patient Instructions (Addendum)
      Urine tests ordered.       Medications changes include :   None    A referral was ordered for urology and someone will call you to schedule an appointment.     No follow-ups on file.

## 2024-03-01 ENCOUNTER — Ambulatory Visit: Payer: Self-pay | Admitting: Internal Medicine

## 2024-03-01 LAB — URINE CULTURE

## 2024-03-03 ENCOUNTER — Encounter: Payer: Self-pay | Admitting: Cardiology

## 2024-03-03 ENCOUNTER — Ambulatory Visit: Attending: Cardiology | Admitting: Cardiology

## 2024-03-03 VITALS — BP 140/64 | HR 62 | Ht 64.5 in | Wt 169.8 lb

## 2024-03-03 DIAGNOSIS — I480 Paroxysmal atrial fibrillation: Secondary | ICD-10-CM | POA: Insufficient documentation

## 2024-03-03 DIAGNOSIS — I10 Essential (primary) hypertension: Secondary | ICD-10-CM | POA: Diagnosis not present

## 2024-03-03 NOTE — Patient Instructions (Addendum)
 Medication Instructions:  Continue same medications *If you need a refill on your cardiac medications before your next appointment, please call your pharmacy*  Lab Work: None ordered  Testing/Procedures: None ordered  Follow-Up: At [facility], you and your health needs are our priority.  As part of our continuing mission to provide you with exceptional heart care, our providers are all part of one team.  This team includes your primary Cardiologist (physician) and Advanced Practice Providers or APPs (Physician Assistants and Nurse Practitioners) who all work together to provide you with the care you need, when you need it.  Your next appointment:  3 months    Friday 2/6 at 10:40 am    Provider:  Dr.Jordan    We recommend signing up for the patient portal called MyChart.  Sign up information is provided on this After Visit Summary.  MyChart is used to connect with patients for Virtual Visits (Telemedicine).  Patients are able to view lab/test results, encounter notes, upcoming appointments, etc.  Non-urgent messages can be sent to your provider as well.   To learn more about what you can do with MyChart, go to [email or web address].

## 2024-03-06 DIAGNOSIS — M25512 Pain in left shoulder: Secondary | ICD-10-CM | POA: Diagnosis not present

## 2024-03-10 DIAGNOSIS — M25512 Pain in left shoulder: Secondary | ICD-10-CM | POA: Diagnosis not present

## 2024-03-24 ENCOUNTER — Ambulatory Visit (HOSPITAL_BASED_OUTPATIENT_CLINIC_OR_DEPARTMENT_OTHER): Admitting: Cardiology

## 2024-03-25 DIAGNOSIS — M25512 Pain in left shoulder: Secondary | ICD-10-CM | POA: Diagnosis not present

## 2024-03-31 ENCOUNTER — Other Ambulatory Visit (HOSPITAL_COMMUNITY): Payer: Self-pay | Admitting: *Deleted

## 2024-03-31 MED ORDER — HYDROCHLOROTHIAZIDE 12.5 MG PO CAPS: 12.5000 mg | ORAL_CAPSULE | Freq: Every day | ORAL | 3 refills | Status: AC

## 2024-04-01 DIAGNOSIS — M25512 Pain in left shoulder: Secondary | ICD-10-CM | POA: Diagnosis not present

## 2024-04-07 ENCOUNTER — Other Ambulatory Visit (HOSPITAL_COMMUNITY): Payer: Self-pay | Admitting: *Deleted

## 2024-04-07 MED ORDER — APIXABAN 5 MG PO TABS: 5.0000 mg | ORAL_TABLET | Freq: Two times a day (BID) | ORAL | 2 refills | Status: AC

## 2024-04-09 ENCOUNTER — Ambulatory Visit: Admitting: Internal Medicine

## 2024-04-11 ENCOUNTER — Telehealth: Payer: Self-pay | Admitting: Cardiology

## 2024-04-11 NOTE — Telephone Encounter (Signed)
" °  Per Mychart scheduling message:   Pt c/o BP issue: STAT if pt c/o blurred vision, one-sided weakness or slurred speech.  STAT if BP is GREATER than 180/120 TODAY.  STAT if BP is LESS than 90/60 and SYMPTOMATIC TODAY  1. What is your BP concern?   2. Have you taken any BP medication today?  3. What are your last 5 BP readings?  4. Are you having any other symptoms (ex. Dizziness, headache, blurred vision, passed out)?    My concern is that when i take  it my blood pressure is 146 and last weekend 159. I would think it would be down in the 130s if this combination was working. Yes one day I was dizzy. I normally before this fit was 140 bp. "

## 2024-04-11 NOTE — Telephone Encounter (Signed)
 Spoke with pt regarding her blood pressure. Pt stated her systolic blood pressure is typically around 140, however it was 160 this past Saturday. Pt did not have any other blood pressure readings to share. Pt stated she took this reading in the afternoon around 2pm. She stated she thought that her medications were not to be taken together so she has been taking the irbesartan  in the morning and the hydrochlorothiazide  in the afternoon around 1 pm. Pt was told to keep a blood pressure log and that Dr. Jordan would be notified. Pt was given ED precautions. Pt verbalized understanding. All questions if any were answered.

## 2024-04-14 NOTE — Telephone Encounter (Signed)
 Spoke with patient and shared message from Dr. Jordan. Patient verbalized understanding and expressed appreciation for call.

## 2024-04-22 ENCOUNTER — Ambulatory Visit: Admitting: Internal Medicine

## 2024-05-22 NOTE — Progress Notes (Unsigned)
 " Cardiology Office Note:    Date:  05/23/2024   ID:  Dana Small, DOB November 25, 1945, MRN [id number]  PCP:  Rollene Almarie LABOR, MD   [facility] Providers Cardiologist:  None     Referring MD: Rollene Almarie LABOR, MD   Chief Complaint  Patient presents with   Paroxysmal atrial fibrillation [facility])   Follow-up    3 month    History of Present Illness:    Dana Small is a 79 y.o. female is seen at the request of Dr Rollene for evaluation of Atrial fibrillation. She is Dr Russ mother in law. She has a history of HTN, HLD, RBBB. She was seen in the ED on 10/3 with AFib with RVR. This was following a prolonged dental procedure. Converted spontaneously to NSR. Was started on Eliquis  and PRN diltiazem . Echo was normal.   She has done well since then. No clear recurrence. She has a Kardiamobile device but hasn't felt need to use it. She did have one day with gross hematuria in November. Culture negative. Thinks she has some intermittent hematuria. Also concerned that her BP is not well controlled.   Past Medical History:  Diagnosis Date   Arthritis    Hypertension     Past Surgical History:  Procedure Laterality Date   COLONOSCOPY  06/11/2013   [facility]   TOE SURGERY     tooth implant      Current Medications: Current Meds  Medication Sig   apixaban  (ELIQUIS ) 5 MG TABS tablet Take 1 tablet (5 mg total) by mouth 2 (two) times daily.   diltiazem  (CARDIZEM ) 30 MG tablet Take 1 tablet (30 mg total) by mouth 3 (three) times daily as needed (heart rate > 100).   hydrochlorothiazide  (MICROZIDE ) 12.5 MG capsule Take 1 capsule (12.5 mg total) by mouth daily.   irbesartan  (AVAPRO ) 300 MG tablet Take 1 tablet (300 mg total) by mouth daily.   pravastatin  (PRAVACHOL ) 20 MG tablet TAKE 1 TABLET(20 MG) BY MOUTH DAILY   [DISCONTINUED] irbesartan  (AVAPRO ) 150 MG tablet TAKE 1 TABLET(150 MG) BY MOUTH DAILY     Allergies:   Patient has no known allergies.    Social History   Socioeconomic History   Marital status: Married    Spouse name: Not on file   Number of children: Not on file   Years of education: Not on file   Highest education level: Associate degree: occupational, scientist, product/process development, or vocational program  Occupational History   Not on file  Tobacco Use   Smoking status: Never   Smokeless tobacco: Never   Tobacco comments:    Never smoked 01/23/24  Vaping Use   Vaping status: Never Used  Substance and Sexual Activity   Alcohol use: Not Currently   Drug use: Never   Sexual activity: Not on file  Other Topics Concern   Not on file  Social History Narrative   Not on file   Social Drivers of Health   Tobacco Use: Low Risk (05/23/2024)   Patient History    Smoking Tobacco Use: Never    Smokeless Tobacco Use: Never    Passive Exposure: Not on file  Financial Resource Strain: Low Risk (02/28/2024)   Overall Financial Resource Strain (CARDIA)    Difficulty of Paying Living Expenses: Not hard at all  Food Insecurity: No Food Insecurity (02/28/2024)   Epic    Worried About Radiation Protection Practitioner of Food in the Last Year: Never true    Ran Out  of Food in the Last Year: Never true  Transportation Needs: No Transportation Needs (02/28/2024)   Epic    Lack of Transportation (Medical): No    Lack of Transportation (Non-Medical): No  Physical Activity: Sufficiently Active (02/28/2024)   Exercise Vital Sign    Days of Exercise per Week: 3 days    Minutes of Exercise per Session: 50 min  Stress: No Stress Concern Present (02/28/2024)   [employer] of Occupational Health - Occupational Stress Questionnaire    Feeling of Stress: Only a little  Social Connections: Moderately Integrated (02/28/2024)   Social Connection and Isolation Panel    Frequency of Communication with Friends and Family: Three times a week    Frequency of Social Gatherings with Friends and Family: Three times a week    Attends Religious Services: More than 4 times per  year    Active Member of Clubs or Organizations: No    Attends Banker Meetings: Not on file    Marital Status: Married  Depression (PHQ2-9): Low Risk (01/18/2024)   Depression (PHQ2-9)    PHQ-2 Score: 0  Alcohol Screen: Low Risk (02/28/2024)   Alcohol Screen    Last Alcohol Screening Score (AUDIT): 1  Housing: Low Risk (02/28/2024)   Epic    Unable to Pay for Housing in the Last Year: No    Number of Times Moved in the Last Year: 0    Homeless in the Last Year: No  Utilities: Low Risk (11/20/2023)   Received from [facility]   Utilities    In the past 12 months has the electric, gas, oil, or water company threatened to shut off services in your home? : No  Health Literacy: Adequate Health Literacy (06/11/2023)   B1300 Health Literacy    Frequency of need for help with medical instructions: Never     Family History: The patient's family history includes Diverticulitis in her father; Heart disease in an other family member; Hypertension in an other family member. There is no history of Breast cancer, Colon cancer, Esophageal cancer, Cancer, Stomach cancer, or Rectal cancer.  ROS:   Please see the history of present illness.     All other systems reviewed and are negative.  EKGs/Labs/Other Studies Reviewed:    The following studies were reviewed today: Echo 02/25/24: IMPRESSIONS     1. Left ventricular ejection fraction, by estimation, is 60 to 65%. The  left ventricle has normal function. The left ventricle has no regional  wall motion abnormalities. There is mild concentric left ventricular  hypertrophy. Left ventricular diastolic  parameters are consistent with Grade I diastolic dysfunction (impaired  relaxation).   2. Right ventricular systolic function is normal. The right ventricular  size is normal.   3. The mitral valve is normal in structure. No evidence of mitral valve  regurgitation. No evidence of mitral stenosis.   4. The aortic valve is tricuspid.  Aortic valve regurgitation is not  visualized. No aortic stenosis is present.   5. The inferior vena cava is normal in size with greater than 50%  respiratory variability, suggesting right atrial pressure of 3 mmHg.        Recent Labs: 07/17/2023: ALT 24; TSH 2.16 01/18/2024: BUN 15; Creatinine, Ser 0.66; Potassium 3.5; Sodium 139 02/25/2024: Hemoglobin 14.3; Platelets 212  Recent Lipid Panel    Component Value Date/Time   CHOL 170 07/17/2023 1454   TRIG 133.0 07/17/2023 1454   HDL 58.50 07/17/2023 1454   CHOLHDL 3 07/17/2023 1454  VLDL 26.6 07/17/2023 1454   LDLCALC 85 07/17/2023 1454     Risk Assessment/Calculations:    CHA2DS2-VASc Score = 4   This indicates a 4.8% annual risk of stroke. The patient's score is based upon: CHF History: 0 HTN History: 1 Diabetes History: 0 Stroke History: 0 Vascular Disease History: 0 Age Score: 2 Gender Score: 1     HYPERTENSION CONTROL Vitals:   05/23/24 1028 05/23/24 1104  BP: (!) 140/70 (!) 142/70    The patient's blood pressure is elevated above target today.  In order to address the patient's elevated BP: A current anti-hypertensive medication was adjusted today.            Physical Exam:    VS:  BP (!) 142/70   Pulse 66   Resp 17   Ht 5' 4 (1.626 m)   Wt 170 lb (77.1 kg)   SpO2 96%   BMI 29.18 kg/m     Wt Readings from Last 3 Encounters:  05/23/24 170 lb (77.1 kg)  03/03/24 169 lb 12.8 oz (77 kg)  02/29/24 171 lb (77.6 kg)     GEN:  Well nourished, well developed in no acute distress HEENT: Normal NECK: No JVD; No carotid bruits LYMPHATICS: No lymphadenopathy CARDIAC: RRR, no murmurs, rubs, gallops RESPIRATORY:  Clear to auscultation without rales, wheezing or rhonchi  ABDOMEN: Soft, non-tender, non-distended MUSCULOSKELETAL:  No edema; No deformity  SKIN: Warm and dry NEUROLOGIC:  Alert and oriented x 3 PSYCHIATRIC:  Normal affect   ASSESSMENT:    1. Paroxysmal atrial fibrillation (HCC)   2.  Primary hypertension   3. Hypercoagulable state due to paroxysmal atrial fibrillation (HCC)     PLAN:    In order of problems listed above:  Paroxysmal Afib x 1. Clinically doing well. CHAD Vasc score of 4. Needs to continue Eliquis  at current dose. Has diltiazem  to take PRN. If she has more frequent/sustained Afib will have EP see. I am concerned about the hematuria she had before. Will repeat CBC and UA. If she has hematuria she should be seen by Urology. If no clear cause may need to consider for Watchman device so she could come off Eliquis  HTN. BP  elevated. Will  increase irbesartan  to 300 mg daily Hematuria without infection. See above       Follow up in 3 months.    Medication Adjustments/Labs and Tests Ordered: Current medicines are reviewed at length with the patient today.  Concerns regarding medicines are outlined above.  Orders Placed This Encounter  Procedures   CBC w/Diff   Basic metabolic panel with GFR   Urinalysis   Meds ordered this encounter  Medications   irbesartan  (AVAPRO ) 300 MG tablet    Sig: Take 1 tablet (300 mg total) by mouth daily.    Dispense:  90 tablet    Refill:  3    Patient Instructions  Medication Instructions:  Increase Avapro  to 300 mg daily Continue all other medications *If you need a refill on your cardiac medications before your next appointment, please call your pharmacy*  Lab Work: Cbc,bmet,u/a today If you have labs (blood work) drawn today and your tests are completely normal, you will receive your results only by: MyChart Message (if you have MyChart) OR A paper copy in the mail If you have any lab test that is abnormal or we need to change your treatment, we will call you to review the results.  Testing/Procedures: None ordered  Follow-Up: At [facility],  you and your health needs are our priority.  As part of our continuing mission to provide you with exceptional heart care, our providers are all part of one  team.  This team includes your primary Cardiologist (physician) and Advanced Practice Providers or APPs (Physician Assistants and Nurse Practitioners) who all work together to provide you with the care you need, when you need it.  Your next appointment:  3 months    Provider:  Dr.Audel Coakley   We recommend signing up for the patient portal called MyChart.  Sign up information is provided on this After Visit Summary.  MyChart is used to connect with patients for Virtual Visits (Telemedicine).  Patients are able to view lab/test results, encounter notes, upcoming appointments, etc.  Non-urgent messages can be sent to your provider as well.   To learn more about what you can do with MyChart, go to [email or web address].             Signed, Caiden Monsivais, MD  05/23/2024 11:04 AM    [facility]  "

## 2024-05-23 ENCOUNTER — Encounter: Payer: Self-pay | Admitting: Cardiology

## 2024-05-23 ENCOUNTER — Ambulatory Visit: Admitting: Cardiology

## 2024-05-23 VITALS — BP 142/70 | HR 66 | Resp 17 | Ht 64.0 in | Wt 170.0 lb

## 2024-05-23 DIAGNOSIS — I10 Essential (primary) hypertension: Secondary | ICD-10-CM

## 2024-05-23 DIAGNOSIS — D6869 Other thrombophilia: Secondary | ICD-10-CM

## 2024-05-23 DIAGNOSIS — I480 Paroxysmal atrial fibrillation: Secondary | ICD-10-CM

## 2024-05-23 MED ORDER — IRBESARTAN 300 MG PO TABS: 300.0000 mg | ORAL_TABLET | Freq: Every day | ORAL | 3 refills | Status: AC

## 2024-05-23 NOTE — Patient Instructions (Addendum)
 Medication Instructions:  Increase Avapro  to 300 mg daily Continue all other medications *If you need a refill on your cardiac medications before your next appointment, please call your pharmacy*  Lab Work: Cbc,bmet,u/a today If you have labs (blood work) drawn today and your tests are completely normal, you will receive your results only by: MyChart Message (if you have MyChart) OR A paper copy in the mail If you have any lab test that is abnormal or we need to change your treatment, we will call you to review the results.  Testing/Procedures: None ordered  Follow-Up: At [facility], you and your health needs are our priority.  As part of our continuing mission to provide you with exceptional heart care, our providers are all part of one team.  This team includes your primary Cardiologist (physician) and Advanced Practice Providers or APPs (Physician Assistants and Nurse Practitioners) who all work together to provide you with the care you need, when you need it.  Your next appointment:  3 months    Thurs 5/7 at 1:20 pm    Provider:  Dr.Jordan   We recommend signing up for the patient portal called MyChart.  Sign up information is provided on this After Visit Summary.  MyChart is used to connect with patients for Virtual Visits (Telemedicine).  Patients are able to view lab/test results, encounter notes, upcoming appointments, etc.  Non-urgent messages can be sent to your provider as well.   To learn more about what you can do with MyChart, go to [email or web address].

## 2024-06-11 ENCOUNTER — Ambulatory Visit: Payer: Medicare Other

## 2024-07-28 ENCOUNTER — Ambulatory Visit (HOSPITAL_COMMUNITY): Admitting: Internal Medicine

## 2024-08-21 ENCOUNTER — Ambulatory Visit: Admitting: Cardiology
